# Patient Record
Sex: Male | Born: 1935
Health system: Southern US, Community
[De-identification: ages and names within clinical notes are randomized; demographics above are authoritative.]

## PROBLEM LIST (undated history)

## (undated) DIAGNOSIS — C801 Malignant (primary) neoplasm, unspecified: Secondary | ICD-10-CM

## (undated) DIAGNOSIS — R0600 Dyspnea, unspecified: Secondary | ICD-10-CM

## (undated) DIAGNOSIS — E785 Hyperlipidemia, unspecified: Secondary | ICD-10-CM

## (undated) DIAGNOSIS — I10 Essential (primary) hypertension: Secondary | ICD-10-CM

## (undated) DIAGNOSIS — I2510 Atherosclerotic heart disease of native coronary artery without angina pectoris: Secondary | ICD-10-CM

## (undated) DIAGNOSIS — E119 Type 2 diabetes mellitus without complications: Secondary | ICD-10-CM

## (undated) HISTORY — PX: PROSTATE ABLATION: SHX6042

## (undated) HISTORY — DX: Malignant (primary) neoplasm, unspecified: C80.1

## (undated) HISTORY — PX: HERNIA REPAIR: SHX51

## (undated) HISTORY — DX: Type 2 diabetes mellitus without complications: E11.9

## (undated) HISTORY — PX: ANTERIOR FUSION CERVICAL SPINE: SUR626

## (undated) HISTORY — DX: Hyperlipidemia, unspecified: E78.5

## (undated) HISTORY — PX: TONSILLECTOMY: SUR1361

## (undated) SURGERY — Surgical Case
Anesthesia: *Unknown

---

## 1998-11-20 ENCOUNTER — Ambulatory Visit (HOSPITAL_COMMUNITY): Admission: RE | Admit: 1998-11-20 | Discharge: 1998-11-20 | Payer: Self-pay | Admitting: *Deleted

## 1999-01-22 ENCOUNTER — Ambulatory Visit (HOSPITAL_COMMUNITY): Admission: RE | Admit: 1999-01-22 | Discharge: 1999-01-22 | Payer: Self-pay | Admitting: *Deleted

## 1999-01-22 ENCOUNTER — Encounter (INDEPENDENT_AMBULATORY_CARE_PROVIDER_SITE_OTHER): Payer: Self-pay | Admitting: Specialist

## 1999-06-04 ENCOUNTER — Encounter: Payer: Self-pay | Admitting: Neurosurgery

## 1999-06-04 ENCOUNTER — Inpatient Hospital Stay (HOSPITAL_COMMUNITY): Admission: EM | Admit: 1999-06-04 | Discharge: 1999-06-06 | Payer: Self-pay | Admitting: Emergency Medicine

## 1999-06-04 ENCOUNTER — Encounter: Payer: Self-pay | Admitting: Emergency Medicine

## 1999-06-25 ENCOUNTER — Encounter: Admission: RE | Admit: 1999-06-25 | Discharge: 1999-06-25 | Payer: Self-pay | Admitting: Neurosurgery

## 1999-06-25 ENCOUNTER — Encounter: Payer: Self-pay | Admitting: Neurosurgery

## 1999-09-03 ENCOUNTER — Encounter: Payer: Self-pay | Admitting: *Deleted

## 1999-09-03 ENCOUNTER — Emergency Department (HOSPITAL_COMMUNITY): Admission: EM | Admit: 1999-09-03 | Discharge: 1999-09-03 | Payer: Self-pay | Admitting: *Deleted

## 1999-09-11 ENCOUNTER — Emergency Department (HOSPITAL_COMMUNITY): Admission: EM | Admit: 1999-09-11 | Discharge: 1999-09-11 | Payer: Self-pay | Admitting: Emergency Medicine

## 2001-12-13 ENCOUNTER — Ambulatory Visit (HOSPITAL_BASED_OUTPATIENT_CLINIC_OR_DEPARTMENT_OTHER): Admission: RE | Admit: 2001-12-13 | Discharge: 2001-12-14 | Payer: Self-pay | Admitting: Orthopaedic Surgery

## 2004-04-05 ENCOUNTER — Encounter (INDEPENDENT_AMBULATORY_CARE_PROVIDER_SITE_OTHER): Payer: Self-pay | Admitting: *Deleted

## 2004-04-05 ENCOUNTER — Inpatient Hospital Stay (HOSPITAL_COMMUNITY): Admission: RE | Admit: 2004-04-05 | Discharge: 2004-04-08 | Payer: Self-pay | Admitting: Urology

## 2006-12-27 ENCOUNTER — Ambulatory Visit (HOSPITAL_COMMUNITY): Admission: RE | Admit: 2006-12-27 | Discharge: 2006-12-27 | Payer: Self-pay | Admitting: *Deleted

## 2007-06-13 ENCOUNTER — Encounter (INDEPENDENT_AMBULATORY_CARE_PROVIDER_SITE_OTHER): Payer: Self-pay | Admitting: Urology

## 2007-06-13 ENCOUNTER — Inpatient Hospital Stay (HOSPITAL_COMMUNITY): Admission: RE | Admit: 2007-06-13 | Discharge: 2007-06-15 | Payer: Self-pay | Admitting: Urology

## 2007-06-13 HISTORY — PX: ROBOT ASSISTED LAPAROSCOPIC RADICAL PROSTATECTOMY: SHX5141

## 2008-03-18 ENCOUNTER — Encounter: Admission: RE | Admit: 2008-03-18 | Discharge: 2008-03-18 | Payer: Self-pay | Admitting: Neurosurgery

## 2010-11-23 NOTE — Op Note (Signed)
NAME:  Mark Elliott, Mark Elliott NO.:  [id number]   MEDICAL RECORD NO.:  [id number]          PATIENT TYPE:  AMB   LOCATION:  ENDO                         FACILITY:  [facility]   PHYSICIAN:  Georgiana Spinner, M.D.    DATE OF BIRTH:  05/25/1937   DATE OF PROCEDURE:  12/27/2006  DATE OF DISCHARGE:                               OPERATIVE REPORT   PROCEDURE:  Colonoscopy.   INDICATIONS:  Colon cancer screening.   ANESTHESIA:  Fentanyl 75 mcg, Versed 8 mg.   DESCRIPTION OF PROCEDURE:  With the patient mildly sedated in the left  lateral decubitus position, a rectal exam was attempted.  Subsequently  the Pentax videoscopic colonoscope was inserted into the rectum; and  passed under direct vision to the cecum identified by ileocecal valve  and appendiceal orifice both of which were photographed.  From this  point the colonoscope was slowly withdrawn taking circumferential views  of the colonic mucosa, stopping in the rectum which appeared normal on  direct; and showed hemorrhoids on retroflex view.  The endoscope was  straightened and withdrawn.  The patient's vital signs and pulse  oximeter remained stable.  The patient tolerated the procedure well  without apparent complications.   FINDINGS:  Internal hemorrhoids, small, otherwise an unremarkable exam.   PLAN:  Repeat examination in 5 years.           ______________________________  Georgiana Spinner, M.D.     GMO/MEDQ  D:  12/27/2006  T:  12/27/2006  Job:  [id number]

## 2010-11-23 NOTE — Op Note (Signed)
NAME:  Mark Elliott, Mark Elliott NO.:  [id number]   MEDICAL RECORD NO.:  [id number]          PATIENT TYPE:  INP   LOCATION:  1401                         FACILITY:  [facility]   PHYSICIAN:  Valetta Fuller, M.D.  DATE OF BIRTH:  05/25/1937   DATE OF PROCEDURE:  06/13/2007  DATE OF DISCHARGE:  06/15/2007                               OPERATIVE REPORT   PREOPERATIVE DIAGNOSIS:  Clinical stage T1C adenocarcinoma of the  prostate.   POSTOPERATIVE DIAGNOSIS:  Clinical stage T1C adenocarcinoma of the  prostate.   PROCEDURE PERFORMED:  Robotic-assisted laparoscopic radical retropubic  prostatectomy.   SURGEON:  Valetta Fuller, M.D.   ASSISTANT:  Lucrezia Starch. Earlene Plater, M.D.   ANESTHESIA:  General endotracheal   INDICATIONS:  Mark Elliott is a 75 year old male.  He is a patient of Dr.  Marcine Matar.  He had had a TURP for benign prostatic hyperplasia  several years ago and that pathology was benign.  He has been very  pleased with his urination.  Recently his PSA was noted be elevated at  approximately the level of 7.  He had an ultrasound and biopsy, which  revealed adenocarcinoma of the prostate.  His biopsy was positive in two  areas.  He had a very low-volume Gleason 3+3=6 tumor at the left mid  area and a low-volume Gleason 3+4=7 tumor at the left apex.  The patient  underwent extensive consultation with regard to treatment options with  Dr. Retta Diones as well as myself.  He elected to proceed with a surgical  approach and chose a robotic laparoscopic approach to managing his  situation.  The patient appeared to understand the advantages and  disadvantages of a surgical approach versus radiation versus observation  versus other potential therapies.  He appeared to understand the  potential complications as well as the risks and benefits of this type  of procedure.  We discussed at length issues with regard to voiding  dysfunction, incontinence and sexual dysfunction and  treatments  available for those problems.  He presents now for the surgery.   TECHNIQUE AND FINDINGS:  The patient did have placement of compression  boots.  He also received perioperative Unasyn.  He was brought to the  operating room and had successful induction of general endotracheal  anesthesia.  He was placed a moderate lithotomy position.  He was  secured to the table and all extremities were carefully padded.  He was  then placed in steep Trendelenburg position and prepped and draped in  the usual manner.  A Foley catheter was inserted sterilely on the  operative field.   Initial incision site was chosen 18 cm above the pubic symphysis just to  the left of the umbilicus.  A standard open Hasson technique was used to  place the initial 12-mm trocar.  This was done without difficulty.  Palpation of the inner aspect of the abdomen revealed no obvious  adhesions.  Insufflation of the abdomen occurred without complication or  problem.  The camera was then inserted and all additional trocars were  placed with direct visual guidance.  This included 12-mm  and 5-mm assist  ports and three 8-mm robotic ports.  Once all ports were placed, the  surgical cart was docked.  The bladder was filled to help identify it  and then this was taken down posteriorly by entering the space of  Retzius.  That space was completely developed.  Endopelvic fascia was  defatted and some fat was taken off the bladder neck to help identify  that area.  The prostate itself was noted to be small and previous  ultrasound had revealed it to be about 20 g.  The endopelvic fascia was  then incised from apex to base and puboprostatic ligaments taken down  sharply.  Levator musculature was swept off the apex of the prostate and  the dorsal vein complex was then isolated and stapled with an ETS  stapling device.  Hemostasis was good.  Attention was then turned to the  anterior bladder neck.  This was then incised and the  Foley catheter was  identified in the midline.  There was no evidence of a middle lobe.  Indigo carmine was given to identify the ureteral orifices, which were  well away from the area of resection.  The bladder neck was reasonably  open from its previous transurethral resection.  The posterior bladder  neck was then transected.  There was quite a bit of scarring in this  area but the vas deferens and seminal vesicles were each identified and  individually dissected.  Once these structures were retracted  anteriorly, we were able to identify the posterior plane between the  rectum and prostate, and that plane was easily established.   Attention was then turned towards bilateral nerve spare.  Superficial  endopelvic fascia overlying the prostate was incised and the  neurovascular bundles swept off the prostate laterally.  Once this plane  had been properly established we were able to retract the prostate  anteriorly and isolate the pedicles, which were taken with hemoclips.  The dorsal vein complexes were completely swept off the apex of the  prostate and the urethra was then divided anteriorly.  The Foley  catheter was retracted and the posterior urethra was transected.  A  small amount of rectourethralis tissue was then incised sharply and the  prostate was removed from the pelvis.  Bladder irrigation was then  performed and there was no significant bleeding or other evidence of any  rectal injury or other problems.  The bladder neck was carefully  inspected.  I did not feel required reconstruction.  Again the ureteral  orifices were identified.  The posterior fascia was reapproximated with  interrupted Vicryl suture.  We then reapproximated the 6 o'clock  urethral stump with the 6 o'clock bladder neck to take tension off these  structures and again to reapproximate things posteriorly.  Once that was  completed, a complete 360-degree running anastomosis was done with a  double-armed 3-0  Monocryl suture.  A new Coude catheter was placed  without difficulty and the bladder was irrigated.  No obvious leakage  was noted.  A pelvic drain was placed through one of the robotic ports  and placed in the retropubic space and then anchored to the skin.  The  12-mm trocar site was closed with a 0 Vicryl suture with the aid of a  suture passer.  All other trocars were removed after removal of the  specimen and placement of the specimen in the Endopouch.  The midline  open Hasson incision had to be extended slightly for specimen  removal.  The camera port incision was closed with a running 0 Vicryl suture.  All  other ports were infiltrated and closed with clips.  The bladder  irrigated clear.  Estimated blood loss was 200 mL.  The patient remained  stable, had no obvious problems or complications, and appeared to  tolerate the procedure well.  He is brought to the recovery room in  stable condition.           ______________________________  Valetta Fuller, M.D.  Electronically Signed     DSG/MEDQ  D:  06/15/2007  T:  06/15/2007  Job:  [id number]

## 2010-11-26 NOTE — Discharge Summary (Signed)
[place]. [facility]  Patient:    Mark Elliott                     MRN: [id number] Adm. Date:  [id number] Disc. Date: 06/06/99 Attending:  Emeterio Reeve                           Discharge Summary  REASON FOR ADMISSION:  Mark Elliott is a 75 year old man with severe neck and left arm pain with large cervical disk herniations at C3-4, C4-5, and C5-6 levels with significant myelopathy.  HOSPITAL COURSE:  He was admitted to the hospital on November 24, taken to surgery and underwent anterior cervical diskectomy and fusion at C3-4, C4-5, and C5-6 levels.  He had significant improvement in his clonus and hyperreflexia, was doing well on the day after, November 25, and was doing well on the morning of November 26, was discharged home in satisfactory and stable condition having tolerated his operation and hospitalization well.  DISCHARGE MEDICATIONS:  Percocet 1-2 q.4-6h. p.r.n. pain.  DISCHARGE INSTRUCTIONS:  Wear a collar at all times.  No lifting, bending, twisting or driving.  Okay to shower, not to soak his incision.  FOLLOWUP:  In two weeks with Dr. Venetia Maxon. DD:  06/06/99 TD:  06/06/99 Job: [id number]

## 2010-11-26 NOTE — Discharge Summary (Signed)
NAME:  Mark Elliott, Mark Elliott NO.:  [id number]   MEDICAL RECORD NO.:  [id number]          PATIENT TYPE:  INP   LOCATION:  0344                         FACILITY:  [facility]   PHYSICIAN:  Bertram Millard. Dahlstedt, M.D.DATE OF BIRTH:  05/25/1937   DATE OF ADMISSION:  04/05/2004  DATE OF DISCHARGE:  04/08/2004                                 DISCHARGE SUMMARY   PRIMARY DIAGNOSIS:  Benign prostatic hypertrophy.   SECONDARY DIAGNOSIS:  Phimosis, balanitis.   PROCEDURE:  On April 05, 2004, transurethral resection of the prostate,  circumcision.   BRIEF HISTORY:  A 75 year old male who has been followed by me for several  years.  He has a large prostate and has significant symptoms from this.  Additionally, the patient has recurrent fungal infections and irritation of  his foreskin.   After conservative followup, the patient desires management of these  surgically.  He was instructed on the procedures as well as the risks and  complications of TURP and circumcision.  He desires to proceed.   HOSPITAL COURSE:  [name] was admitted directly to the operating room.  After preoperative antibiotic prophylaxis, he underwent TURP and  circumcision.  He actually did quite well during the surgery.  His urine did  stay bloody for more than two days afterwards, and it took two days of full  bladder irrigation to get his urine clear.  Additionally, his catheter was  hand-irrigated.   His catheter was removed on the 29th, three days postoperatively.  He was  discharged from the hospital at that time.  He was voiding well.  His  circumcision incision was healing well.   He was discharged on all of his home medications plus Bactrim DS 1 p.o.  daily x10 days.  He was told that he could resume his aspirin in a week.  He  was given activity instructions.  He will follow up with me in approximately  three weeks.   Pathologic analysis of the patient's prostatic chips reveal no evidence of  cancer.     SMD/MEDQ  D:  04/16/2004  T:  04/17/2004  Job:  [id number]   cc:   Brooke Bonito, M.D.  [address]  [place]  [place] [address]  Fax: [phone]

## 2010-11-26 NOTE — Op Note (Signed)
[place]. [facility]  Patient:    Mark Elliott, Mark Elliott Visit Number: [id number] MRN: [id number]          Service Type: DSU Location: [facility] Attending Physician:  Randolm Idol Dictated by:   Claude Manges. Cleophas Dunker, M.D. Proc. Date: 12/13/01 Admit Date:  12/13/2001 Discharge Date: 12/14/2001                             Operative Report  PREOPERATIVE DIAGNOSIS: Rotator cuff tear, right shoulder with impingement.  POSTOPERATIVE DIAGNOSIS: Rotator cuff tear, right shoulder with impingement.  PROCEDURE: Rotator cuff tear repair with arthroscopic subacromial decompression.  SURGEON:  Claude Manges. Cleophas Dunker, M.D.  ANESTHESIA:  General orotracheal anesthesia.  COMPLICATIONS: None.  HISTORY: The patient is a 75 year old diabetic male who sustained an injury to his right shoulder approximately a month ago.  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 He has had difficulty raising his arm over his head with weakness and with pain.  He has had an MRI scan that reveals a full thickness rotator cuff tear.  He is now to have an arthroscopic subacromial decompression and an open mini rotator cuff tear repair.  PROCEDURE: With the patient comfortable on the operating table and under general orotracheal anesthesia, the patient was placed in the semi-sitting position using a shoulder frame.  The right shoulder was then prepped and the base of the neck circumferentially below the elbow with DuraPrep.  Sterile draping was performed.  A marking pen was used to outline the acromion, the acromioclavicular joint and the coracoid.  At a point a finger breadth inferior and medial to the posterior angle of the acromion, a small stab wound was made following which 0.25% Marcaine with epinephrine was injected.  A small stab wound was made and diagnostic arthroscopy was performed.   There was absence of the biceps tendon; it had absolutely ruptured.  There was a small stump.  There was some fraying of the anterior glenoid labrum and minimal chondromalacia changes of the glenoid and the humeral head. A second portal was established anteriorly so that I could debride the labrum and the biceps tendon.  I could also visualize the rotator cuff tear of the infra and supraspinatus tendon.  The arthroscope was then placed in the subacromial space posteriorly. A third space was established in the mid lateral plane and arthroscopic subacromial decompression was performed.  There was a moderate amount of bursal tissue which was resected with the shaver and the Arthricare wand. The 6 mm bur was used to perform an anterior acromioplasty.  The patient did not have symptoms of the acromioclavicular joint.  A mini open incision was then made at the junction of the lateral and anterior acromion.  About a two inch incision was made and via sharp dissection was carried down to the subcutaneous tissue.  The deltoid fascia was identified and incised longitudinally. A small self-retaining retractor was inserted. The rotator cuff tear was identified; it was longitudinal in nature extending about two inches from the attachment of the rotator cuff posteriorly. I trimmed the edges and was able to repair it from back to front such that I had a complete repair and did not need any type of an anchor for repair.  I probed it carefully and it felt that I had an excellent repair  of good rotator cuff tissue.  I also palpated the acromioplasty and felt an excellent decompression.  There was no evidence for any further impingement.  The wound was then copiously irrigated with saline solution.  The deltoid fascia was closed anatomically with a running 0 Vicryl, the subcutaneous closed with the same material and the skin closed with skin clips.  A sterile bulky dressing was applied followed by a  sling.  The patient did have an interscalene block preoperatively for pain control.  PLAN:  Recovery care overnight, Percocet at home, sling and to return to the office in one week.  Dictated by:   Claude Manges. Cleophas Dunker, M.D. Attending Physician:  Randolm Idol DD:  12/13/01 TD:  12/16/01 Job: [phone]

## 2010-11-26 NOTE — Discharge Summary (Signed)
NAME:  DAVIER, TRAMELL NO.:  [id number]   MEDICAL RECORD NO.:  [id number]          PATIENT TYPE:  INP   LOCATION:  1401                         FACILITY:  [facility]   PHYSICIAN:  Valetta Fuller, M.D.  DATE OF BIRTH:  05/25/1937   DATE OF ADMISSION:  06/13/2007  DATE OF DISCHARGE:  06/15/2007                               DISCHARGE SUMMARY   DISCHARGE DIAGNOSIS:  Adenocarcinoma of the prostate (pathologic stage  T2C tumor.)   OPERATION PERFORMED:  Robotic assisted laparoscopic radical retropubic  prostatectomy on June 13, 2007.   HOSPITAL COURSE:  Mr. Helzer is a 75 year old male.  He underwent a TURP  for benign disease several years earlier.  PSA increased to 7.  He had  an ultrasound biopsy which revealed adenocarcinoma of the prostate.  He  had Gleason 3+3=6 tumor in the left mid area and Gleason 3+4=7 tumor at  the left apex.  The patient underwent extensive consultation and elected  to have a robotic assisted laparoscopic radical retropubic  prostatectomy.  The patient's surgery was performed on June 13, 2007.  No obvious complications or problems occurred and surgery was  uneventful.  The patient was admitted for routine postoperative care.  On postoperative day 1, he remained stable with good urinary output.  Postoperative hemoglobin was 12.1.  JP drainage was relatively minimal.  The patient was kept an additional day.  On postoperative day 2, he  remained afebrile.  He was feeling much better with better pain control.  Jackson-Pratt drainage was minimal and was removed.  He continued to  have excellent urinary output.  The patient's postoperative course again  was otherwise uneventful.  The patient's final pathology revealed  bilateral Gleason 3+4=7 tumor.  There was no evidence of capsular  extension.  Margins were negative and seminal vesicles were also  negative.   DISPOSITION:  The patient was discharged to home.  He was given oral  pain  medication prescription.  Catheter was left indwelling and he  underwent leg bag teaching.   FOLLOW UP:  He will have followup in our office established for  approximately 10 days for catheter removal, voiding trial and staple  removal.           ______________________________  Valetta Fuller, M.D.  Electronically Signed     DSG/MEDQ  D:  07/20/2007  T:  07/21/2007  Job:  [id number]

## 2010-11-26 NOTE — H&P (Signed)
[place]. [facility]  Patient:    Mark Elliott                     MRN: [id number] Adm. Date:  [id number] Attending:  Emeterio Reeve                         History and Physical  ADMISSION DIAGNOSIS:  Herniated disc at C3-4, C4-5 with cervical myelopathy.  SERVICE:  Neurosurgery.  HISTORY OF PRESENT ILLNESS:  This is a 75 year old right-handed black gentleman, who for a week has been having neck pain.  He visited with his home doctor and received some Vicodin and anti-inflammatories.  He went back on Wednesday and got a Medrol Dosepak, which helped only slightly.  He has neck and intractable left shoulder and arm pain.  He came to the emergency room this morning and had an MRI done which shows a large herniated disc at both C3-4 and C4-C5 with compression, not only of the left C5 nerve root, but also the spinal cord.  He does not notice any tripping and stumbling nor incontinence.  MEDICAL HISTORY:  Remarkable for adult-onset diabetes, for which he is on Glucophage, Actos and Glucotrol XL; he is also at the end of a Medrol Dosepak and is taking Tylox.  ALLERGIES:  He has no allergies.  PAST SURGICAL HISTORY:  His only other surgery has been a tonsillectomy.  FAMILY HISTORY:  Unknown.  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  REVIEW OF SYSTEMS:  Remarkable for neck and shoulder pain.  PHYSICAL EXAMINATION:  HEENT:  Within normal limits.  NECK:  His neck has limited range of motion in extension.  Turning towards the eft causes excruciating left arm pain.  He also gets tingling down both arms and both legs.  CHEST:  Clear.  CARDIAC:  Regular rate and rhythm.  ABDOMEN:  Nontender.  No hepatosplenomegaly.  EXTREMITIES:  Without clubbing or cyanosis.  GU:  Exam is deferred.  PERIPHERAL PULSES:  Good.  NEUROLOGIC:  He is awake, alert and oriented.  His cranial nerves are intact. Motor exam  demonstrates 5/5 strength throughout the right-sided upper and lower  extremities.  In the left upper extremity, he has weakness of the deltoid and weakness of the biceps.  He has a left C5 sensory deficit.  He has a positive Hoffmanns bilaterally.  His reflexes are 3+ in the legs with clonus in both feet and his toes are upgoing.  MRI FINDINGS:  MRI has been reviewed above that shows a disc at 3-4 and 4-5.  CLINICAL IMPRESSION:  Left C7 radiculopathy and spondylitic myelopathy.  PLAN:  The plan is for emergent cervical diskectomy and fusion at 3-4 and 4-5. I see no benefit to waiting due to the fact that he is myelopathic and has some acute bulge in the anterior epidural space, suggesting that this operation should occur soon.  The risks and benefits of this approach have been discussed with him and his wife and he wishes to proceed. DD:  06/04/99 TD:  06/04/99 Job: [id number]

## 2010-11-26 NOTE — Op Note (Signed)
NAME:  ZYHIR, CAPPELLA NO.:  [id number]   MEDICAL RECORD NO.:  [id number]          PATIENT TYPE:  INP   LOCATION:  NA                           FACILITY:  [facility]   PHYSICIAN:  Bertram Millard. Dahlstedt, M.D.DATE OF BIRTH:  05/25/1937   DATE OF PROCEDURE:  04/05/2004  DATE OF DISCHARGE:                                 OPERATIVE REPORT   PREOPERATIVE DIAGNOSIS:  1.  Benign prostatic hypertrophy with significant lower urinary tract      symptoms.  2.  History of balanitis with mild phimosis.   PRINCIPAL PROCEDURE:  1.  Transurethral resection of the prostate.  2.  Circumcision.   ANESTHESIA:  General endotracheal.   COMPLICATIONS:  None.   SURGEON:  Bertram Millard. Dahlstedt, M.D.   BRIEF HISTORY:  This nice 75 year old male comes to the operating room  for  TURP and circumcision.  This has been discussed with the patient in depth in  the past.  He has significant lower urinary tract symptoms and has recurrent  problems with balanitis and mild phimosis of his foreskin.  He is aware of  the risks and complications of both TURP and circumcision and desires to  proceed.   DESCRIPTION OF PROCEDURE:  The patient was administered a general anesthetic  after preoperative IV antibiotics were administered.  He was placed in the  dorsal lithotomy position.  Genitalia and perineum were prepped and draped.  The urethral meatus was calibrated.  The 77 [place] with [employer].  A 28 French resectoscope sheath was placed.  The resectoscope was then  placed.  The bladder was inspected and found to be normal.  The prostate was  moderately obstructed with trilobar hypertrophy.  There was a moderate-sized  intravesical median lobe.  The median lobe was then taken down to the  surgical capsule from the bladder neck to the verumontanum.  The  verumontanum was left intact to use as a landmark.  The right and left  prostatic lobes were then resected from the bladder neck down to the  apex.  Resection was carried down to the surgical capsule.  Anterior tissue was  taken down and resected as well.  All bleeders were electrocoagulated.  There was a capacious prostatic fossa at this point with adequate  hemostasis.  The chips were evacuated from the bladder.  Inspection of the  bladder revealed all prostatic chips to be evacuated.  At this point, the  scope was removed.  A 22 French Foley catheter with three-way irrigation was  placed.  A 30 cc balloon was filled.  The specimens were sent for pathologic  examination.   Circumcision was then performed at this point.  Proximal and distal  incisions in the foreskin were made, and then the foreskin was excised with  electrocautery.  Small bleeders underneath the foreskin were  electrocoagulated.  Quadrant sutures were placed using 4-0 chromic with a U  stitch at the frenulum.  The same 4-0 chromic was used to run the incision  closed in a simple running fashion in between the quadrant sutures.  Hemostasis was then excellent.  A  dressing of Vaseline gauze, Kling, and  Coban was then placed without too much pressure, to avoid  ischemia of the glans.  At this point, the procedure was terminated.  The  Foley was hooked up to overhead irrigation with saline.  He tolerated the  procedure well.  Plain Marcaine 0.5% 10 cc was used as a dorsal penile  block.  At this point, the patient was awakened, extubated, and taken to the  PACU in stable condition.      SMD/MEDQ  D:  04/05/2004  T:  04/05/2004  Job:  [id number]   cc:   Brooke Bonito, M.D.  [address]  [place]  [place] [address]  Fax: [phone]

## 2010-11-26 NOTE — Op Note (Signed)
[place]  Patient:    Mark Elliott                     MRN: [id number] Proc. Date: 06/04/99 Adm. Date:  [id number] Attending:  Emeterio Reeve                           Operative Report  PREOPERATIVE DIAGNOSIS:  Cervical myelopathy with a left C5 and C6 radiculopathy with a herniated disk at C3-4, C4-5 and C5-C6.  POSTOPERATIVE DIAGNOSIS:  Cervical myelopathy with a left C5 and C6 radiculopathy with a herniated disk at C3-4, C4-5 and C5-C6.  OPERATION PERFORMED:  Anterior cervical diskectomy and fusion at C3-4, C4-5 and  C5-C6.  SURGEON:  Payton Doughty, M.D.  ANESTHESIA:  General endotracheal.  PREP:  Sterile Betadine prep and scrub with alcohol wipe.  COMPLICATIONS:  None.  DESCRIPTION OF PROCEDURE:  This 75 year old gentleman with cervical spondylosis, herniated disk and cervical myelopathy and left C5 and C6 radiculopathies.  The patient was taken to the operating room, smoothly anesthetized and intubated, and placed supine on the operating table.  During intubation, the head was held  neutral and his neck was only very slightly extended in the halter head traction. Following shave, prep and drape in the usual sterile fashion, skin was incised rom the level of the carotid tubercle parallelling the sternocleidomastoid muscle superiorly to about 1 cm in the angle of the jaw.  The platysma was identified,  elevated and divided.  Medial dissection revealed the sternocleidomastoid muscle and medial dissection dissection of that revealed the carotid artery which was retracted laterally to the left, trachea and esophagus retracted laterally to the right.  Intraoperative x-ray was obtained with marker in place to confirm correctness of the level.  A second film was obtained that showed a marker to be at 3-4.  Diskectomy was carried out at that level and two levels contiguous with it below.  The longus coli muscle was taken  down and the Shadowline retractor placed. Diskectomy was then carried out at 3-4, 4-5 and 5-6.  It was done at each level  first under gross observation and then the operating microscope was brought in nd operating through the disk space to ensure that all neural structures were decompressed.  At C3-C4 there was a central disk slightly eccentric to the right with distortion of the right side of the cord.  At C4-C5 there was an enormous central disk that extruded below the body and down over the body of C5.  This was removed without difficulty resulting in immediate decompression of the cord. There was also a bit of a left-sided component out over the left nerve root.  At C5-C6 there was a very large component extending out towards the left side out over the left C6 nerve root.  This was decompressed as well.  Having completed diskectomy and exploring both nerve roots at each level, 7 mm bone grafts were fashioned from patellar allograft and tapped into place.  A 66 mm A-line plate was then placed  with two screws at C3, one at C4, one at C5 and two in C6.  Intraoperative x-ray showed good placement of bone grafts, plate and screws.  Locking screws were placed.  The wound was irrigated and hemostasis assured.  The platysma was reapproximated with 3-0 Vicryl in interrupted fashion.  The subcutaneous tissues were reapproximated with 3-0 Vicryl in interrupted fashion.  The skin was closed with 4-0 Vicryl in a running subcuticular fashion.  Benzoin and Steri-Strips were placed and made occlusive with Telfa and Op-Site.  The patient then placed in an Aspen collar and returned to the recovery room in good condition. DD:  06/04/99 TD:  06/05/99 Job: [id number]

## 2010-11-26 NOTE — H&P (Signed)
NAME:  Mark Elliott, Mark Elliott NO.:  [id number]   MEDICAL RECORD NO.:  [id number]          PATIENT TYPE:  INP   LOCATION:  NA                           FACILITY:  [facility]   PHYSICIAN:  Bertram Millard. Dahlstedt, M.D.DATE OF BIRTH:  05/25/1937   DATE OF ADMISSION:  04/05/2004  DATE OF DISCHARGE:                                HISTORY & PHYSICAL   REASON FOR ADMISSION:  Large prostate.   BRIEF HISTORY:  This nice 75 year old gentleman has been seen by me for a  few years. He initially presented with prostatitis. He has had a minimally  elevated PSA. His PSAs actually have been on the upper limits of normal for  a man his age. He had never had a suspicious examination of his prostate.   The patient has been followed recently for significant lower urinary tract  symptoms. Specifically, he has an IPSS score of 17. He was recently  evaluated with cystoscopy and a flow rate. Peak flow was 6 cc. He had  obstruction evidence cystoscopically.    The patient has desired management of his lower urinary tract symptoms. We  talked about minimally invasive thermotherapy versus a TURP. The patient is  also having some significant problems with balanitis and mild phimosis of  his foreskin. I suggested we take care of both of these under an anesthetic  with TURP and circumcision. He is aware of the risks and complications  associated with both of these procedures. He understands and desires to  proceed.   PAST MEDICAL HISTORY:  1.  Significant for diabetes.  2.  He has erectile dysfunction.  3.  He has had a history of elevated cholesterol. Dr. Juleen China follows him from      a medical standpoint.   PAST SURGICAL HISTORY:  Significant for cervical surgery in 2002 and rotator  cuff surgery in 2003.   MEDICATIONS:  Actos and Glucovance as well as Pravachol.   ALLERGIES:  There are no known drug allergies.   SOCIAL HISTORY:  The patient is married. He is retired. He has 2 children.   FAMILY HISTORY:  Significant for diabetes.   REVIEW OF SYSTEMS:  Noncontributory.   PHYSICAL EXAMINATION:  GENERAL:  Healthy appearing pleasant middle-aged  male.  VITAL SIGNS:  Blood pressure 122/72, pulse 60, temperature 97.8, respiratory  rate 12.  HEENT:  Normal.  NECK:  Supple without thyromegaly or adenopathy.  CHEST:  Clear bilaterally.  HEART:  Regular rate and rhythm without murmurs, rubs, or gallops.  ABDOMEN:  Protuberant, soft, nondistended, nontender. No masses, no megaly.  GENITOURINARY:  Bladder not palpable. Phallus uncircumcised. He has mild  phimosis with some skin changes on the foreskin consistent with chronic  infections. Glands and meatus are normal. Scrotal skin unremarkable.  Testicles descended bilaterally. Cords and epididymal strictures normal.  Normal anal sphincter tone. Gland 30 to 40 g, symmetrical, nonnodular,  nontender. No rectal masses. Seminal vesicles were nonpalpable.  PERIPHERAL VASCULAR:  Normal.   ADMISSION DATA:  EKG:  Normal sinus rhythm with sinus rhythm with short PR.  He has a right bundle branch block. PSA was 4.1.  BMET was normal for glucose  of 220. CBC was normal except for a white count of 3,800.   IMPRESSION:  1.  Benign prostatic hypertrophy with significant lower urinary tract      symptomatology.  2.  Phimosis with history of balanitis.  3.  Diabetes.   PLAN:  Admit for cystoscopy, transurethral resection of the prostate, and  circumcision.      SMD/MEDQ  D:  04/05/2004  T:  04/05/2004  Job:  [id number]   cc:   Brooke Bonito, M.D.  [address]  [place]  [place] [address]  Fax: [phone]

## 2011-04-18 LAB — BASIC METABOLIC PANEL
BUN: 11
BUN: 15
CO2: 25
CO2: 28
Calcium: 8.7
Calcium: 9.2
Chloride: 101
Chloride: 105
Creatinine, Ser: 1.1
Creatinine, Ser: 1.13
GFR calc Af Amer: >60
GFR calc Af Amer: >60
GFR calc non Af Amer: >60
GFR calc non Af Amer: >60
Glucose, Bld: 141 — ABNORMAL HIGH
Glucose, Bld: 230 — ABNORMAL HIGH
Potassium: 4
Potassium: 4.2
Sodium: 135
Sodium: 135

## 2011-04-18 LAB — HEMOGLOBIN AND HEMATOCRIT, BLOOD
HCT: 38 — ABNORMAL LOW
Hemoglobin: 13.2

## 2011-04-18 LAB — TYPE AND SCREEN
ABO/RH(D): O POS
Antibody Screen: NEGATIVE

## 2011-04-18 LAB — CBC
HCT: 34.9 — ABNORMAL LOW
Hemoglobin: 12.1 — ABNORMAL LOW
MCHC: 34.6
MCV: 85.8
Platelets: 219
RBC: 4.07 — ABNORMAL LOW
RDW: 14.1
WBC: 6.2

## 2011-04-18 LAB — DIFFERENTIAL
Basophils Absolute: 0
Basophils Relative: 1
Eosinophils Absolute: 0 — ABNORMAL LOW
Eosinophils Relative: 0
Lymphocytes Relative: 16
Lymphs Abs: 1
Monocytes Absolute: 0.5
Monocytes Relative: 8
Neutro Abs: 4.7
Neutrophils Relative %: 76

## 2011-04-18 LAB — URINALYSIS, ROUTINE W REFLEX MICROSCOPIC
Bilirubin Urine: NEGATIVE
Glucose, UA: NEGATIVE
Hgb urine dipstick: NEGATIVE
Ketones, ur: NEGATIVE
Nitrite: NEGATIVE
Protein, ur: NEGATIVE
Specific Gravity, Urine: 1.022
Urobilinogen, UA: 0.2
pH: 5.5

## 2011-04-18 LAB — ABO/RH: ABO/RH(D): O POS

## 2012-04-26 ENCOUNTER — Ambulatory Visit: Payer: Self-pay | Admitting: Family Medicine

## 2012-04-26 VITALS — BP 150/80 | HR 83 | Temp 98.1°F | Resp 16 | Ht 67.0 in | Wt 220.0 lb

## 2012-04-26 DIAGNOSIS — Z0289 Encounter for other administrative examinations: Secondary | ICD-10-CM

## 2012-04-26 DIAGNOSIS — Z0000 Encounter for general adult medical examination without abnormal findings: Secondary | ICD-10-CM

## 2012-04-26 NOTE — Progress Notes (Signed)
See DOT form  bp recheck 128/62

## 2013-02-13 ENCOUNTER — Encounter (INDEPENDENT_AMBULATORY_CARE_PROVIDER_SITE_OTHER): Payer: Self-pay | Admitting: General Surgery

## 2013-02-13 ENCOUNTER — Ambulatory Visit (INDEPENDENT_AMBULATORY_CARE_PROVIDER_SITE_OTHER): Payer: Medicare Other | Admitting: General Surgery

## 2013-02-13 VITALS — BP 126/68 | HR 64 | Resp 14 | Ht 66.0 in | Wt 227.4 lb

## 2013-02-13 DIAGNOSIS — N509 Disorder of male genital organs, unspecified: Secondary | ICD-10-CM

## 2013-02-13 NOTE — Progress Notes (Signed)
Chief complaint: Left groin stinging, question hernia, skin lesion  History: Patient is a 77 year old male referred by Dr. Juleen China for some left groin discomfort and possible hernia. He states that for about a month he has had some stinging in his left groin but this seems to be off and while toweling off after the shower. It is very brief period he thinks there may be a bulge that comes and goes. He has no previous history of hernia repair. He does have a history of robotic prostatectomy and also has had a penile prosthesis pump as well. He has no associated GI symptoms.  Past Medical History  Diagnosis Date  . Cancer   . Diabetes mellitus without complication   . Hyperlipidemia    Past Surgical History  Procedure Laterality Date  . Anterior fusion cervical spine     Current Outpatient Prescriptions  Medication Sig Dispense Refill  . aspirin 81 MG tablet Take 81 mg by mouth daily.      . insulin lispro (HUMALOG) 100 UNIT/ML injection Inject into the skin 3 (three) times daily before meals.      . Liraglutide (VICTOZA [place]) Inject into the skin.      . metFORMIN (GLUCOPHAGE) 500 MG tablet Take 500 mg by mouth 2 (two) times daily with a meal.      . pravastatin (PRAVACHOL) 10 MG tablet Take 10 mg by mouth daily.       No current facility-administered medications for this visit.   No Known Allergies History  Substance Use Topics  . Smoking status: Former Smoker    Quit date: 02/13/1974  . Smokeless tobacco: Never Used  . Alcohol Use: No    Exam: BP 126/68  Pulse 64  Resp 14  Ht 5\' 6"  (1.676 m)  Wt 227 lb 6.4 oz (103.148 kg)  BMI 36.72 kg/m2 General: Well-developed Afro-American male no distress Skin: No rash or infection Lungs: Clear equal breath sounds Cardiac: Regular rate and rhythm. No edema. Abdomen: Soft and nontender. Careful palpation the left inguinal area I cannot feel any definite hernia with coughing or straining. No hernia palpable on the right. He does however have  an approximately 1 cm raised pigmented skin lesion on the lower left groin or upper scrotum which he indicates is what has been stinging.  Assessment and plan: Left groin discomfort. On questioning  and exam the skin lesion on his upper left scrotum appears to be what is bothering him. I cannot demonstrate a hernia on physical exam today. After discussion today he would very much like to have this removed as he feels it is the source of his symptoms.  After explaining the procedure, under local anesthesia with sterile technique I did an elliptical excision of the skin lesion into the subcutaneous tissue and closed this with interrupted nylon. This was sent for permanent pathology. Wound care was explained.  He will return next week for suture removal. I told them that if this does not relieve his symptoms or he notices a definite bulge or continued pain to return for a recheck.

## 2013-02-13 NOTE — Addendum Note (Signed)
Addended by: Maryan Puls on: 02/13/2013 02:14 PM   Modules accepted: Orders

## 2013-02-13 NOTE — Patient Instructions (Signed)
Keep incision dry tonight then may shower daily and leave uncovered orcover with a Band-Aid if more comfortable

## 2013-02-15 ENCOUNTER — Telehealth (INDEPENDENT_AMBULATORY_CARE_PROVIDER_SITE_OTHER): Payer: Self-pay

## 2013-02-15 NOTE — Telephone Encounter (Signed)
Message copied by Maryan Puls on Fri Feb 15, 2013  9:45 AM ------      Message from: Glenna Fellows T      Created: Fri Feb 15, 2013  9:14 AM       Please call patient "Skin lesion is benign" ------

## 2013-02-15 NOTE — Telephone Encounter (Signed)
Called and spoke to patient, pathology results given (skin lesion is Benign)  Patient reports he's doing well and very pleased with his pathology results.

## 2013-02-22 ENCOUNTER — Encounter (INDEPENDENT_AMBULATORY_CARE_PROVIDER_SITE_OTHER): Payer: Medicare Other

## 2013-02-25 ENCOUNTER — Ambulatory Visit (INDEPENDENT_AMBULATORY_CARE_PROVIDER_SITE_OTHER): Payer: Medicare Other

## 2013-02-25 DIAGNOSIS — Z4802 Encounter for removal of sutures: Secondary | ICD-10-CM

## 2013-02-25 NOTE — Progress Notes (Signed)
Pt comes in today for suture removal of 3 sutures post skin lesion removal of the scrotal area.  The incision looked good, completely closed.  I prepped the incision with Chloraprep and removed all three sutures.  There was no bleeding.  I applied a basic band-aid.

## 2013-04-15 ENCOUNTER — Telehealth (INDEPENDENT_AMBULATORY_CARE_PROVIDER_SITE_OTHER): Payer: Self-pay

## 2013-04-15 NOTE — Telephone Encounter (Signed)
Per physician request, copy of pathology report from 02/13/13 faxed to Dr. Juleen China.  Fax confirmation rec'd.

## 2013-07-01 ENCOUNTER — Other Ambulatory Visit: Payer: Self-pay | Admitting: Gastroenterology

## 2013-07-01 DIAGNOSIS — R1033 Periumbilical pain: Secondary | ICD-10-CM

## 2013-07-01 DIAGNOSIS — R634 Abnormal weight loss: Secondary | ICD-10-CM

## 2013-07-09 ENCOUNTER — Ambulatory Visit
Admission: RE | Admit: 2013-07-09 | Discharge: 2013-07-09 | Disposition: A | Discharge status: Home / Self Care | Payer: Medicare Other | Source: Ambulatory Visit | Attending: Gastroenterology | Admitting: Gastroenterology

## 2013-07-09 DIAGNOSIS — R634 Abnormal weight loss: Secondary | ICD-10-CM

## 2013-07-09 DIAGNOSIS — R1033 Periumbilical pain: Secondary | ICD-10-CM

## 2013-07-09 MED ORDER — IOHEXOL 300 MG/ML  SOLN
125.0000 mL | Freq: Once | INTRAMUSCULAR | Status: AC | PRN
  Administered 2013-07-09: 125 mL via INTRAVENOUS

## 2013-08-09 ENCOUNTER — Ambulatory Visit (INDEPENDENT_AMBULATORY_CARE_PROVIDER_SITE_OTHER): Payer: Medicare Other | Admitting: General Surgery

## 2013-08-09 ENCOUNTER — Encounter (INDEPENDENT_AMBULATORY_CARE_PROVIDER_SITE_OTHER): Payer: Self-pay | Admitting: General Surgery

## 2013-08-09 ENCOUNTER — Encounter (INDEPENDENT_AMBULATORY_CARE_PROVIDER_SITE_OTHER): Payer: Self-pay

## 2013-08-09 VITALS — BP 126/70 | HR 74 | Temp 97.6°F | Resp 17 | Ht 66.0 in | Wt 196.4 lb

## 2013-08-09 DIAGNOSIS — K4090 Unilateral inguinal hernia, without obstruction or gangrene, not specified as recurrent: Secondary | ICD-10-CM

## 2013-08-09 NOTE — Progress Notes (Signed)
Chief complaint: Evaluation for inguinal hernias  History: Patient returns to the office for a recheck. I had seen him not long ago for a possible left inguinal hernia. He did have a skin lesion that was irrigated on his upper scrotum that we removed and he states that he has not had any more discomfort in either groin since that time. He however has had some ongoing issues with upper abdominal discomfort and nausea area he saw Dr. Benson Norway and with a family history of prostate cancer a CT scan of the abdomen was obtained. This showed no issues in his upper abdomen but noted were a "tiny fat-containing left inguinal hernia and a small fluid containing right inguinal hernia". Again the patient denies any groin discomfort or swelling or bulge.  Past Medical History  Diagnosis Date  . Cancer   . Diabetes mellitus without complication   . Hyperlipidemia    Past Surgical History  Procedure Laterality Date  . Anterior fusion cervical spine    . Hernia repair     Current Outpatient Prescriptions  Medication Sig Dispense Refill  . aspirin 81 MG tablet Take 81 mg by mouth daily.      . clarithromycin (BIAXIN XL) 500 MG 24 hr tablet       . insulin lispro (HUMALOG) 100 UNIT/ML injection Inject into the skin 3 (three) times daily before meals.      . Liraglutide (VICTOZA [place]) Inject into the skin.      . metFORMIN (GLUCOPHAGE) 500 MG tablet Take 500 mg by mouth 2 (two) times daily with a meal.      . ondansetron (ZOFRAN-ODT) 4 MG disintegrating tablet       . pravastatin (PRAVACHOL) 10 MG tablet Take 10 mg by mouth daily.      . traMADol (ULTRAM) 50 MG tablet        No current facility-administered medications for this visit.   No Known Allergies   Exam: BP 126/70  Pulse 74  Temp(Src) 97.6 F (36.4 C) (Temporal)  Resp 17  Ht 5\' 6"  (1.676 m)  Wt 196 lb 6.4 oz (89.086 kg)  BMI 31.71 kg/m2 Gen.: A thin African American male in no distress Abdomen generally soft and nontender. Nondistended. With  careful palpation of both groins with the patient standing and coughing and doing Valsalva maneuver I cannot feel any evidence of any significant hernia in either groin. He is nontender in this area.  Assessment and plan: Very small bilateral inguinal hernias noted on CT scan which by careful history are entirely asymptomatic and his CT scan was done for symptoms that are clearly unrelated to this. I do not feel hernias on exam. I do not feel he needs any repair. We discussed the finding by CT and I discussed with him that we will occasionally see tiny hernias noted on CT scan are clinically insignificant. I encouraged him to come back for evaluation should he notice any groin pain or bulge.

## 2013-08-14 ENCOUNTER — Other Ambulatory Visit: Payer: Self-pay | Admitting: Gastroenterology

## 2013-08-14 DIAGNOSIS — R112 Nausea with vomiting, unspecified: Secondary | ICD-10-CM

## 2013-08-22 ENCOUNTER — Ambulatory Visit (HOSPITAL_COMMUNITY)
Admission: RE | Admit: 2013-08-22 | Discharge: 2013-08-22 | Disposition: A | Discharge status: Home / Self Care | Payer: Medicare Other | Source: Ambulatory Visit | Attending: Gastroenterology | Admitting: Gastroenterology

## 2013-08-22 DIAGNOSIS — R112 Nausea with vomiting, unspecified: Secondary | ICD-10-CM | POA: Insufficient documentation

## 2013-08-22 MED ORDER — TECHNETIUM TC 99M MEBROFENIN IV KIT
5.0000 | PACK | Freq: Once | INTRAVENOUS | Status: AC | PRN
  Administered 2013-08-22: 5 via INTRAVENOUS

## 2013-08-30 ENCOUNTER — Encounter (HOSPITAL_COMMUNITY)
Admission: RE | Admit: 2013-08-30 | Discharge: 2013-08-30 | Disposition: A | Discharge status: Home / Self Care | Payer: Medicare Other | Source: Ambulatory Visit | Attending: Gastroenterology | Admitting: Gastroenterology

## 2013-08-30 DIAGNOSIS — R112 Nausea with vomiting, unspecified: Secondary | ICD-10-CM | POA: Insufficient documentation

## 2013-08-30 MED ORDER — TECHNETIUM TC 99M SULFUR COLLOID
2.0000 | Freq: Once | INTRAVENOUS | Status: AC | PRN
  Administered 2013-08-30: 2 via INTRAVENOUS

## 2013-12-29 ENCOUNTER — Encounter (HOSPITAL_COMMUNITY): Payer: Self-pay | Admitting: Emergency Medicine

## 2013-12-29 ENCOUNTER — Emergency Department (HOSPITAL_COMMUNITY)
Admission: EM | Admit: 2013-12-29 | Discharge: 2013-12-29 | Disposition: A | Discharge status: Home / Self Care | Payer: Medicare Other | Attending: Emergency Medicine | Admitting: Emergency Medicine

## 2013-12-29 DIAGNOSIS — Z981 Arthrodesis status: Secondary | ICD-10-CM | POA: Insufficient documentation

## 2013-12-29 DIAGNOSIS — M545 Low back pain, unspecified: Secondary | ICD-10-CM | POA: Insufficient documentation

## 2013-12-29 DIAGNOSIS — M5380 Other specified dorsopathies, site unspecified: Secondary | ICD-10-CM | POA: Diagnosis not present

## 2013-12-29 DIAGNOSIS — M546 Pain in thoracic spine: Secondary | ICD-10-CM | POA: Diagnosis present

## 2013-12-29 DIAGNOSIS — Z87891 Personal history of nicotine dependence: Secondary | ICD-10-CM | POA: Insufficient documentation

## 2013-12-29 DIAGNOSIS — Z7982 Long term (current) use of aspirin: Secondary | ICD-10-CM | POA: Diagnosis not present

## 2013-12-29 DIAGNOSIS — E785 Hyperlipidemia, unspecified: Secondary | ICD-10-CM | POA: Diagnosis not present

## 2013-12-29 DIAGNOSIS — E119 Type 2 diabetes mellitus without complications: Secondary | ICD-10-CM | POA: Insufficient documentation

## 2013-12-29 DIAGNOSIS — Z79899 Other long term (current) drug therapy: Secondary | ICD-10-CM | POA: Diagnosis not present

## 2013-12-29 DIAGNOSIS — Z859 Personal history of malignant neoplasm, unspecified: Secondary | ICD-10-CM | POA: Diagnosis not present

## 2013-12-29 DIAGNOSIS — Z794 Long term (current) use of insulin: Secondary | ICD-10-CM | POA: Diagnosis not present

## 2013-12-29 DIAGNOSIS — M62830 Muscle spasm of back: Secondary | ICD-10-CM

## 2013-12-29 MED ORDER — IBUPROFEN 200 MG PO TABS
600.0000 mg | ORAL_TABLET | Freq: Once | ORAL | Status: AC
  Administered 2013-12-29: 600 mg via ORAL

## 2013-12-29 MED ORDER — DIAZEPAM 5 MG PO TABS
5.0000 mg | ORAL_TABLET | Freq: Once | ORAL | Status: AC
  Administered 2013-12-29: 5 mg via ORAL
  Filled 2013-12-29: qty 1

## 2013-12-29 MED ORDER — OXYCODONE-ACETAMINOPHEN 5-325 MG PO TABS: 1.0000 | ORAL_TABLET | Freq: Four times a day (QID) | ORAL | Status: DC | PRN

## 2013-12-29 MED ORDER — IBUPROFEN 600 MG PO TABS: 600.0000 mg | ORAL_TABLET | Freq: Four times a day (QID) | ORAL | Status: DC | PRN

## 2013-12-29 MED ORDER — DIAZEPAM 5 MG PO TABS: 5.0000 mg | ORAL_TABLET | Freq: Three times a day (TID) | ORAL | Status: DC | PRN

## 2013-12-29 MED ORDER — OXYCODONE-ACETAMINOPHEN 5-325 MG PO TABS
1.0000 | ORAL_TABLET | Freq: Once | ORAL | Status: AC
  Administered 2013-12-29: 1 via ORAL
  Filled 2013-12-29: qty 1

## 2013-12-29 MED ORDER — IBUPROFEN 200 MG PO TABS
600.0000 mg | ORAL_TABLET | Freq: Once | ORAL | Status: DC
  Filled 2013-12-29 (×2): qty 3

## 2013-12-29 NOTE — Discharge Instructions (Signed)
Back Exercises Back exercises help treat and prevent back injuries. The goal of back exercises is to increase the strength of your abdominal and back muscles and the flexibility of your back. These exercises should be started when you no longer have back pain. Back exercises include:  Pelvic Tilt. Lie on your back with your knees bent. Tilt your pelvis until the lower part of your back is against the floor. Hold this position 5 to 10 sec and repeat 5 to 10 times.  Knee to Chest. Pull first 1 knee up against your chest and hold for 20 to 30 seconds, repeat this with the other knee, and then both knees. This may be done with the other leg straight or bent, whichever feels better.  Sit-Ups or Curl-Ups. Bend your knees 90 degrees. Start with tilting your pelvis, and do a partial, slow sit-up, lifting your trunk only 30 to 45 degrees off the floor. Take at least 2 to 3 seconds for each sit-up. Do not do sit-ups with your knees out straight. If partial sit-ups are difficult, simply do the above but with only tightening your abdominal muscles and holding it as directed.  Hip-Lift. Lie on your back with your knees flexed 90 degrees. Push down with your feet and shoulders as you raise your hips a couple inches off the floor; hold for 10 seconds, repeat 5 to 10 times.  Back arches. Lie on your stomach, propping yourself up on bent elbows. Slowly press on your hands, causing an arch in your low back. Repeat 3 to 5 times. Any initial stiffness and discomfort should lessen with repetition over time.  Shoulder-Lifts. Lie face down with arms beside your body. Keep hips and torso pressed to floor as you slowly lift your head and shoulders off the floor. Do not overdo your exercises, especially in the beginning. Exercises may cause you some mild back discomfort which lasts for a few minutes; however, if the pain is more severe, or lasts for more than 15 minutes, do not continue exercises until you see your caregiver.  Improvement with exercise therapy for back problems is slow.  See your caregivers for assistance with developing a proper back exercise program. Document Released: 08/04/2004 Document Revised: 09/19/2011 Document Reviewed: 04/28/2011 [facility] Patient Information 2015 [place], [place]. This information is not intended to replace advice given to you by your health care provider. Make sure you discuss any questions you have with your health care provider.  Lumbosacral Strain Lumbosacral strain is a strain of any of the parts that make up your lumbosacral vertebrae. Your lumbosacral vertebrae are the bones that make up the lower third of your backbone. Your lumbosacral vertebrae are held together by muscles and tough, fibrous tissue (ligaments).  CAUSES  A sudden blow to your back can cause lumbosacral strain. Also, anything that causes an excessive stretch of the muscles in the low back can cause this strain. This is typically seen when people exert themselves strenuously, fall, lift heavy objects, bend, or crouch repeatedly. RISK FACTORS  Physically demanding work.  Participation in pushing or pulling sports or sports that require a sudden twist of the back (tennis, golf, baseball).  Weight lifting.  Excessive lower back curvature.  Forward-tilted pelvis.  Weak back or abdominal muscles or both.  Tight hamstrings. SIGNS AND SYMPTOMS  Lumbosacral strain may cause pain in the area of your injury or pain that moves (radiates) down your leg.  DIAGNOSIS Your health care provider can often diagnose lumbosacral strain through a physical  exam. In some cases, you may need tests such as X-ray exams.  TREATMENT  Treatment for your lower back injury depends on many factors that your clinician will have to evaluate. However, most treatment will include the use of anti-inflammatory medicines. HOME CARE INSTRUCTIONS   Avoid hard physical activities (tennis, racquetball, waterskiing) if you are not in  proper physical condition for it. This may aggravate or create problems.  If you have a back problem, avoid sports requiring sudden body movements. Swimming and walking are generally safer activities.  Maintain good posture.  Maintain a healthy weight.  For acute conditions, you may put ice on the injured area.  Put ice in a plastic bag.  Place a towel between your skin and the bag.  Leave the ice on for 20 minutes, 2-3 times a day.  When the low back starts healing, stretching and strengthening exercises may be recommended. SEEK MEDICAL CARE IF:  Your back pain is getting worse.  You experience severe back pain not relieved with medicines. SEEK IMMEDIATE MEDICAL CARE IF:   You have numbness, tingling, weakness, or problems with the use of your arms or legs.  There is a change in bowel or bladder control.  You have increasing pain in any area of the body, including your belly (abdomen).  You notice shortness of breath, dizziness, or feel faint.  You feel sick to your stomach (nauseous), are throwing up (vomiting), or become sweaty.  You notice discoloration of your toes or legs, or your feet get very cold. MAKE SURE YOU:   Understand these instructions.  Will watch your condition.  Will get help right away if you are not doing well or get worse. Document Released: 04/06/2005 Document Revised: 07/02/2013 Document Reviewed: 02/13/2013 [facility] Patient Information 2015 [place], [place]. This information is not intended to replace advice given to you by your health care provider. Make sure you discuss any questions you have with your health care provider.  Muscle Cramps and Spasms Muscle cramps and spasms occur when a muscle or muscles tighten and you have no control over this tightening (involuntary muscle contraction). They are a common problem and can develop in any muscle. The most common place is in the calf muscles of the leg. Both muscle cramps and muscle spasms are  involuntary muscle contractions, but they also have differences:   Muscle cramps are sporadic and painful. They may last a few seconds to a quarter of an hour. Muscle cramps are often more forceful and last longer than muscle spasms.  Muscle spasms may or may not be painful. They may also last just a few seconds or much longer. CAUSES  It is uncommon for cramps or spasms to be due to a serious underlying problem. In many cases, the cause of cramps or spasms is unknown. Some common causes are:   Overexertion.   Overuse from repetitive motions (doing the same thing over and over).   Remaining in a certain position for a long period of time.   Improper preparation, form, or technique while performing a sport or activity.   Dehydration.   Injury.   Side effects of some medicines.   Abnormally low levels of the salts and ions in your blood (electrolytes), especially potassium and calcium. This could happen if you are taking water pills (diuretics) or you are pregnant.  Some underlying medical problems can make it more likely to develop cramps or spasms. These include, but are not limited to:   Diabetes.   Parkinson  disease.   Hormone disorders, such as thyroid problems.   Alcohol abuse.   Diseases specific to muscles, joints, and bones.   Blood vessel disease where not enough blood is getting to the muscles.  HOME CARE INSTRUCTIONS   Stay well hydrated. Drink enough water and fluids to keep your urine clear or pale yellow.  It may be helpful to massage, stretch, and relax the affected muscle.  For tight or tense muscles, use a warm towel, heating pad, or hot shower water directed to the affected area.  If you are sore or have pain after a cramp or spasm, applying ice to the affected area may relieve discomfort.  Put ice in a plastic bag.  Place a towel between your skin and the bag.  Leave the ice on for 15-20 minutes, 03-04 times a day.  Medicines used to  treat a known cause of cramps or spasms may help reduce their frequency or severity. Only take over-the-counter or prescription medicines as directed by your caregiver. SEEK MEDICAL CARE IF:  Your cramps or spasms get more severe, more frequent, or do not improve over time.  MAKE SURE YOU:   Understand these instructions.  Will watch your condition.  Will get help right away if you are not doing well or get worse. Document Released: 12/17/2001 Document Revised: 10/22/2012 Document Reviewed: 06/13/2012 [facility] Patient Information 2015 [place], [place]. This information is not intended to replace advice given to you by your health care provider. Make sure you discuss any questions you have with your health care provider.

## 2013-12-29 NOTE — ED Provider Notes (Signed)
TIME SEEN: 9:09 AM  CHIEF COMPLAINT: Back pain, spasm  HPI: Patient is a 78 year old male with history of diabetes, hyperlipidemia, prostate cancer status post TURP who presents the emergency department with back pain that he describes as a spasm in his right mid and lower back. No radiation of pain. Worse with movement. Better with staying still. He states that his pain started after he was a deep sea fishing on Wednesday, 4 days ago. He states he woke up the next morning and had pain in his back. Denies any known injury. No numbness, tingling or focal weakness. No bowel or bladder incontinence. No fever. No dysuria, hematuria, urinary frequency or urgency. He has had a history of a cervical fusion in the past but no other back surgery or injections. Patient has been taking tramadol that he had left over from another physician and his wife's Flexeril at home without relief. No chest or abdominal pain.  ROS: See HPI Constitutional: no fever  Eyes: no drainage  ENT: no runny nose   Cardiovascular:  no chest pain  Resp: no SOB  GI: no vomiting GU: no dysuria Integumentary: no rash  Allergy: no hives  Musculoskeletal: no leg swelling  Neurological: no slurred speech ROS otherwise negative  PAST MEDICAL HISTORY/PAST SURGICAL HISTORY:  Past Medical History  Diagnosis Date  . Cancer   . Diabetes mellitus without complication   . Hyperlipidemia     MEDICATIONS:  Prior to Admission medications   Medication Sig Start Date End Date Taking? Authorizing Delia Sitar  aspirin 81 MG tablet Take 81 mg by mouth daily.    Historical Khaiden Segreto, MD  clarithromycin (BIAXIN XL) 500 MG 24 hr tablet  07/24/13   Historical Graceson Nichelson, MD  insulin lispro (HUMALOG) 100 UNIT/ML injection Inject into the skin 3 (three) times daily before meals.    Historical Elecia Serafin, MD  Liraglutide (VICTOZA ) Inject into the skin.    Historical Kawika Bischoff, MD  metFORMIN (GLUCOPHAGE) 500 MG tablet Take 500 mg by mouth 2 (two) times  daily with a meal.    Historical Nancye Grumbine, MD  ondansetron (ZOFRAN-ODT) 4 MG disintegrating tablet  07/17/13   Historical Edit Ricciardelli, MD  pravastatin (PRAVACHOL) 10 MG tablet Take 10 mg by mouth daily.    Historical Johnsie Moscoso, MD  traMADol Veatrice Bourbon) 50 MG tablet  07/24/13   Historical Nikesh Teschner, MD    ALLERGIES:  No Known Allergies  SOCIAL HISTORY:  History  Substance Use Topics  . Smoking status: Former Smoker    Quit date: 02/13/1974  . Smokeless tobacco: Never Used  . Alcohol Use: No    FAMILY HISTORY: Family History  Problem Relation Age of Onset  . Cancer Father     liver  . Cancer Brother     pancreatic    EXAM: BP 137/102  Pulse 105  Temp(Src) 97.9 F (36.6 C)  Resp 22  SpO2 99% CONSTITUTIONAL: Alert and oriented and responds appropriately to questions. Well-appearing; well-nourished, patient is elderly but nontoxic, appears very uncomfortable when he is attempting to sit up or move HEAD: Normocephalic EYES: Conjunctivae clear, PERRL ENT: normal nose; no rhinorrhea; moist mucous membranes; pharynx without lesions noted NECK: Supple, no meningismus, no LAD  CARD: RRR; S1 and S2 appreciated; no murmurs, no clicks, no rubs, no gallops RESP: Normal chest excursion without splinting or tachypnea; breath sounds clear and equal bilaterally; no wheezes, no rhonchi, no rales,  ABD/GI: Normal bowel sounds; non-distended; soft, non-tender, no rebound, no guarding BACK:  The back appears normal and  there is no midline spinal tenderness or step-off or deformity, no CVA tenderness, patient is tender to palpation over the paraspinal musculature of the mid and lower right back EXT: Normal ROM in all joints; non-tender to palpation; no edema; normal capillary refill; no cyanosis    SKIN: Normal color for age and race; warm NEURO: Moves all extremities equally, strength 5/5 in all 4 extremity, negative straight leg raise, 2+ deep tendon reflexes in bilateral upper and lower extremities,  sensation to light touch intact diffusely PSYCH: The patient's mood and manner are appropriate. Grooming and personal hygiene are appropriate.  MEDICAL DECISION MAKING: Patient here with likely muscle spasm, strain. He has no midline spinal tenderness. No red flag symptoms. He is neurologically intact. He is hemodynamically stable. We'll give Percocet, Valium, ibuprofen and reassess. I am not concerned for cauda equina, spinal stenosis, epidural abscess, discitis, transverse myelitis at this time.  ED PROGRESS: Patient reports feeling much better after Percocet, Valium and ibuprofen. He is able to ambulate without difficulty. Will discharge home. Have discussed strict return precautions and supportive care instructions with patient and his wife who verbalized understanding and are comfortable with this plan.     [place], DO 12/29/13 1043

## 2013-12-29 NOTE — ED Notes (Signed)
Pt sts was deep sea fishing and hurt his back on Thursday, sts taken tramadol and flexeril without relief.

## 2013-12-29 NOTE — ED Notes (Signed)
Dr. Leonides Schanz notified that pt dropped first order of pills on floor and they were reordered. Tim, Agricultural consultant wasted dropped pills

## 2014-03-10 ENCOUNTER — Ambulatory Visit (INDEPENDENT_AMBULATORY_CARE_PROVIDER_SITE_OTHER): Payer: Self-pay | Admitting: Emergency Medicine

## 2014-03-10 VITALS — BP 122/64 | HR 71 | Temp 98.0°F | Resp 16 | Ht 67.5 in | Wt 212.0 lb

## 2014-03-10 DIAGNOSIS — Z0000 Encounter for general adult medical examination without abnormal findings: Secondary | ICD-10-CM

## 2014-03-10 NOTE — Progress Notes (Signed)
[facility] [address], [place] [address] [phone]  Date:  03/10/2014   Name:  Mark Elliott   DOB:  05/25/1937   MRN:  [id number]  PCP:  Dwan Bolt, MD    Chief Complaint: Employment Physical   History of Present Illness:  Mark Elliott is a 78 y.o. 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 Denies other complaint or health concern today.   There are no active problems to display for this patient.   Past Medical History  Diagnosis Date  . Cancer   . Diabetes mellitus without complication   . Hyperlipidemia     Past Surgical History  Procedure Laterality Date  . Anterior fusion cervical spine    . Hernia repair      History  Substance Use Topics  . Smoking status: Former Smoker    Quit date: 02/13/1974  . Smokeless tobacco: Never Used  . Alcohol Use: No    Family History  Problem Relation Age of Onset  . Cancer Father     liver  . Cancer Brother     pancreatic    No Known Allergies  Medication list has been reviewed and updated.  Current Outpatient Prescriptions on File Prior to Visit  Medication Sig Dispense Refill  . aspirin 81 MG tablet Take 81 mg by mouth every evening.       . diazepam (VALIUM) 5 MG tablet Take 1 tablet (5 mg total) by mouth every 8 (eight) hours as needed for muscle spasms.  10 tablet  0  . Empagliflozin (JARDIANCE) 25 MG TABS Take 25 mg by mouth daily.      Marland Kitchen ibuprofen (ADVIL,MOTRIN) 600 MG tablet Take 1 tablet (600 mg total) by mouth every 6 (six) hours as needed.  20 tablet  0  . insulin glargine (LANTUS) 100 UNIT/ML injection Inject 14 Units into the skin every morning.      . Liraglutide (VICTOZA [place]) Inject 1.8 mg into the skin daily.       . metFORMIN (GLUCOPHAGE) 500 MG tablet Take 500 mg by mouth 2 (two) times daily with a meal.      . oxyCODONE-acetaminophen (PERCOCET/ROXICET) 5-325 MG per tablet Take 1 tablet by mouth every 6 (six) hours as  needed (pain).  15 tablet  0  . pravastatin (PRAVACHOL) 10 MG tablet Take 10 mg by mouth every evening.        No current facility-administered medications on file prior to visit.    Review of Systems:  As per HPI, otherwise negative.    Physical Examination: Filed Vitals:   03/10/14 1518  BP: 122/64  Pulse: 71  Temp: 98 F (36.7 C)  Resp: 16   Filed Vitals:   03/10/14 1518  Height: 5' 7.5" (1.715 m)  Weight: 212 lb (96.163 kg)   Body mass index is 32.69 kg/(m^2). Ideal Body Weight: Weight in (lb) to have BMI = 25: 161.7  GEN: WDWN, NAD, Non-toxic, A & O x 3 HEENT: Atraumatic, Normocephalic. Neck supple. No masses, No LAD. Ears and Nose: No external deformity. CV: RRR, No M/G/R. No JVD. No thrill. No extra heart sounds. PULM: CTA B, no wheezes, crackles, rhonchi. No retractions. No resp. distress. No accessory muscle use. ABD: S, NT, ND, +BS. No rebound. No HSM. EXTR: No c/c/e NEURO Normal gait.  PSYCH: Normally interactive. Conversant. Not depressed or anxious appearing.  Calm demeanor.    Assessment and Plan:  DOT  Signed,  Ellison Carwin, MD

## 2014-07-23 DIAGNOSIS — Z125 Encounter for screening for malignant neoplasm of prostate: Secondary | ICD-10-CM | POA: Diagnosis not present

## 2014-07-23 DIAGNOSIS — Z79899 Other long term (current) drug therapy: Secondary | ICD-10-CM | POA: Diagnosis not present

## 2014-07-23 DIAGNOSIS — E118 Type 2 diabetes mellitus with unspecified complications: Secondary | ICD-10-CM | POA: Diagnosis not present

## 2014-07-23 DIAGNOSIS — E789 Disorder of lipoprotein metabolism, unspecified: Secondary | ICD-10-CM | POA: Diagnosis not present

## 2014-07-30 DIAGNOSIS — E118 Type 2 diabetes mellitus with unspecified complications: Secondary | ICD-10-CM | POA: Diagnosis not present

## 2014-07-30 DIAGNOSIS — E789 Disorder of lipoprotein metabolism, unspecified: Secondary | ICD-10-CM | POA: Diagnosis not present

## 2014-07-30 DIAGNOSIS — C61 Malignant neoplasm of prostate: Secondary | ICD-10-CM | POA: Diagnosis not present

## 2014-11-20 DIAGNOSIS — E118 Type 2 diabetes mellitus with unspecified complications: Secondary | ICD-10-CM | POA: Diagnosis not present

## 2014-12-02 DIAGNOSIS — M7061 Trochanteric bursitis, right hip: Secondary | ICD-10-CM | POA: Diagnosis not present

## 2014-12-02 DIAGNOSIS — M542 Cervicalgia: Secondary | ICD-10-CM | POA: Diagnosis not present

## 2014-12-02 DIAGNOSIS — M67912 Unspecified disorder of synovium and tendon, left shoulder: Secondary | ICD-10-CM | POA: Diagnosis not present

## 2014-12-02 DIAGNOSIS — M7062 Trochanteric bursitis, left hip: Secondary | ICD-10-CM | POA: Diagnosis not present

## 2014-12-04 DIAGNOSIS — E118 Type 2 diabetes mellitus with unspecified complications: Secondary | ICD-10-CM | POA: Diagnosis not present

## 2014-12-04 DIAGNOSIS — M542 Cervicalgia: Secondary | ICD-10-CM | POA: Diagnosis not present

## 2014-12-15 DIAGNOSIS — Z6833 Body mass index (BMI) 33.0-33.9, adult: Secondary | ICD-10-CM | POA: Diagnosis not present

## 2014-12-15 DIAGNOSIS — M4712 Other spondylosis with myelopathy, cervical region: Secondary | ICD-10-CM | POA: Diagnosis not present

## 2014-12-24 DIAGNOSIS — E118 Type 2 diabetes mellitus with unspecified complications: Secondary | ICD-10-CM | POA: Diagnosis not present

## 2014-12-25 DIAGNOSIS — E118 Type 2 diabetes mellitus with unspecified complications: Secondary | ICD-10-CM | POA: Diagnosis not present

## 2015-03-04 DIAGNOSIS — H40013 Open angle with borderline findings, low risk, bilateral: Secondary | ICD-10-CM | POA: Diagnosis not present

## 2015-03-04 DIAGNOSIS — H35033 Hypertensive retinopathy, bilateral: Secondary | ICD-10-CM | POA: Diagnosis not present

## 2015-03-04 DIAGNOSIS — E119 Type 2 diabetes mellitus without complications: Secondary | ICD-10-CM | POA: Diagnosis not present

## 2015-03-04 DIAGNOSIS — H04123 Dry eye syndrome of bilateral lacrimal glands: Secondary | ICD-10-CM | POA: Diagnosis not present

## 2015-03-04 DIAGNOSIS — H1013 Acute atopic conjunctivitis, bilateral: Secondary | ICD-10-CM | POA: Diagnosis not present

## 2015-03-30 DIAGNOSIS — E789 Disorder of lipoprotein metabolism, unspecified: Secondary | ICD-10-CM | POA: Diagnosis not present

## 2015-03-30 DIAGNOSIS — E118 Type 2 diabetes mellitus with unspecified complications: Secondary | ICD-10-CM | POA: Diagnosis not present

## 2015-04-13 DIAGNOSIS — Z23 Encounter for immunization: Secondary | ICD-10-CM | POA: Diagnosis not present

## 2015-04-13 DIAGNOSIS — E118 Type 2 diabetes mellitus with unspecified complications: Secondary | ICD-10-CM | POA: Diagnosis not present

## 2015-06-02 DIAGNOSIS — R05 Cough: Secondary | ICD-10-CM | POA: Diagnosis not present

## 2015-06-02 DIAGNOSIS — J329 Chronic sinusitis, unspecified: Secondary | ICD-10-CM | POA: Diagnosis not present

## 2015-09-04 DIAGNOSIS — I10 Essential (primary) hypertension: Secondary | ICD-10-CM | POA: Diagnosis not present

## 2015-09-07 DIAGNOSIS — R112 Nausea with vomiting, unspecified: Secondary | ICD-10-CM | POA: Diagnosis not present

## 2015-09-07 DIAGNOSIS — E782 Mixed hyperlipidemia: Secondary | ICD-10-CM | POA: Diagnosis not present

## 2015-09-07 DIAGNOSIS — E119 Type 2 diabetes mellitus without complications: Secondary | ICD-10-CM | POA: Diagnosis not present

## 2015-09-21 DIAGNOSIS — E118 Type 2 diabetes mellitus with unspecified complications: Secondary | ICD-10-CM | POA: Diagnosis not present

## 2015-09-21 DIAGNOSIS — Z125 Encounter for screening for malignant neoplasm of prostate: Secondary | ICD-10-CM | POA: Diagnosis not present

## 2015-09-21 DIAGNOSIS — E119 Type 2 diabetes mellitus without complications: Secondary | ICD-10-CM | POA: Diagnosis not present

## 2015-10-06 DIAGNOSIS — E789 Disorder of lipoprotein metabolism, unspecified: Secondary | ICD-10-CM | POA: Diagnosis not present

## 2015-10-06 DIAGNOSIS — N400 Enlarged prostate without lower urinary tract symptoms: Secondary | ICD-10-CM | POA: Diagnosis not present

## 2015-10-06 DIAGNOSIS — E118 Type 2 diabetes mellitus with unspecified complications: Secondary | ICD-10-CM | POA: Diagnosis not present

## 2016-02-01 ENCOUNTER — Telehealth: Payer: Self-pay

## 2016-02-01 ENCOUNTER — Ambulatory Visit (INDEPENDENT_AMBULATORY_CARE_PROVIDER_SITE_OTHER): Payer: Self-pay | Admitting: Urgent Care

## 2016-02-01 VITALS — BP 138/84 | HR 71 | Temp 97.8°F | Resp 17 | Ht 68.0 in | Wt 220.0 lb

## 2016-02-01 DIAGNOSIS — Z8639 Personal history of other endocrine, nutritional and metabolic disease: Secondary | ICD-10-CM

## 2016-02-01 DIAGNOSIS — Z021 Encounter for pre-employment examination: Secondary | ICD-10-CM

## 2016-02-01 LAB — POCT GLYCOSYLATED HEMOGLOBIN (HGB A1C): Hemoglobin A1C: 9.7

## 2016-02-01 NOTE — Patient Instructions (Signed)
     IF you received an x-ray today, you will receive an invoice from [facility]. Please contact [facility] at [phone] with questions or concerns regarding your invoice.   IF you received labwork today, you will receive an invoice from [facility]. Please contact [facility] at [phone] with questions or concerns regarding your invoice.   Our billing staff will not be able to assist you with questions regarding bills from these companies.  You will be contacted with the lab results as soon as they are available. The fastest way to get your results is to activate your My Chart account. Instructions are located on the last page of this paperwork. If you have not heard from us regarding the results in 2 weeks, please contact this office.      

## 2016-02-01 NOTE — Telephone Encounter (Signed)
Instructed by Rosario Adie PA-C, I called the pharmacy and got his current medication list because he had not reported to staff that he was on any diabetic medication.   I gave the list to [facility] and he was very Patent attorney.  Per Rosario Adie, PA-C I called patient and asked him had he left the office without being instructed by the provider.  He confirmed that he had left and stated that the provider was hard to deal with.  He stated that we could send him a bill.  I reported this to [facility].  He thanked me for calling.

## 2016-02-01 NOTE — Progress Notes (Signed)
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 The patient reports that is only taking baby aspirin daily. He states that he had a past diagnosis of diabetes but is not being treated for this anymore because he does not have diabetes.   The following portions of the patient's history were reviewed and updated as appropriate: allergies, current medications, past family history, past medical history, past social history and past surgical history.  Objective:   BP 138/84 (BP Location: Right Arm, Patient Position: Sitting, Cuff Size: Normal)   Pulse 71   Temp 97.8 F (36.6 C) (Oral)   Resp 17   Ht 5\' 8"  (1.727 m)   Wt 220 lb (99.8 kg)   SpO2 99%   BMI 33.45 kg/m   Vision/hearing:  Visual Acuity Screening   Right eye Left eye Both eyes  Without correction: 20/20 20/20-1 20/20  With correction:     Comments: Titmus test was 85 degrees in both eyes.  Hearing Screening Comments: Whisper test is 10 ft in both ears  Patient can recognize and distinguish among traffic control signals and devices showing standard red, green, and amber colors.  Corrective lenses required: No  Monocular Vision?: No  Hearing aid requirement: No  Physical Exam  Labs: Comments: SPGR:1.025,Glu:neg,Blood:neg,Pro:neg  Assessment:   No physical exam performed.   Does not meet standards in 49 CFR 391.41.    Plan:   Patient falsified his health history despite me having a discussion about needing to provide truthful information and not committing fraud. We called and tried to verify his medications with the pharmacy he reported using. This was not true. Medical assistant Renee tracked down the actual pharmacy he uses and they reported that he is on insulin, metformin and pravastatin. When I returned to the exam room, the patient had already left. I will report this to the DOT.

## 2016-02-01 NOTE — Progress Notes (Deleted)
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 The patient reports ***.  The following portions of the patient's history were reviewed and updated as appropriate: allergies, current medications, past family history, past medical history, past social history and past surgical history.  Objective:   BP 138/84 (BP Location: Right Arm, Patient Position: Sitting, Cuff Size: Normal)   Pulse 71   Temp 97.8 F (36.6 C) (Oral)   Resp 17   Ht 5\' 8"  (1.727 m)   Wt 220 lb (99.8 kg)   SpO2 99%   BMI 33.45 kg/m   Vision/hearing:  Visual Acuity Screening   Right eye Left eye Both eyes  Without correction: 20/20 20/20-1 20/20  With correction:     Comments: Titmus test was 85 degrees in both eyes.  Hearing Screening Comments: Whisper test is 10 ft in both ears  Patient can recognize and distinguish among traffic control signals and devices showing standard red, green, and amber colors.  Corrective lenses required: No  Monocular Vision?: No  Hearing aid requirement: No  Physical Exam  Constitutional: He is oriented to person, place, and time. He appears well-developed and well-nourished.  HENT:  TM's intact bilaterally, no effusions or erythema. Nasal turbinates pink and moist, nasal passages patent. No sinus tenderness. Oropharynx clear, mucous membranes moist, dentition in good repair.  Eyes: Conjunctivae and EOM are normal. Pupils are equal, round, and reactive to light. Right eye exhibits no discharge. Left eye exhibits no discharge. No scleral icterus.  Neck: Normal range of motion. Neck supple. No thyromegaly present.  Cardiovascular: Normal rate, regular rhythm and intact distal pulses.  Exam reveals no gallop and no friction rub.   No murmur heard. Pulmonary/Chest: No stridor. No respiratory distress. He has no wheezes. He has no rales.  Abdominal: Soft. Bowel sounds are normal. He exhibits no distension  and no mass. There is no tenderness.  Musculoskeletal: Normal range of motion. He exhibits no edema or tenderness.  Lymphadenopathy:    He has no cervical adenopathy.  Neurological: He is alert and oriented to person, place, and time. He has normal reflexes.  Skin: Skin is warm and dry. No rash noted. No erythema. No pallor.  Psychiatric: He has a normal mood and affect.    Labs: Comments: SPGR:1.025,Glu:neg,Blood:neg,Pro:neg  Results for orders placed or performed in visit on 02/01/16 (from the past 24 hour(s))  POCT glycosylated hemoglobin (Hb A1C)     Status: None   Collection Time: 02/01/16 10:15 AM  Result Value Ref Range   Hemoglobin A1C 9.7     Assessment:    Healthy male exam.  Meets standards, but periodic monitoring required due to DM, HL.  Driver qualified only for 6 months.    Plan:   [id number] examiners certificate completed and printed.","Return as needed."}

## 2016-04-12 DIAGNOSIS — M549 Dorsalgia, unspecified: Secondary | ICD-10-CM | POA: Diagnosis not present

## 2016-04-12 DIAGNOSIS — R079 Chest pain, unspecified: Secondary | ICD-10-CM | POA: Diagnosis not present

## 2016-06-30 DIAGNOSIS — E118 Type 2 diabetes mellitus with unspecified complications: Secondary | ICD-10-CM | POA: Diagnosis not present

## 2016-07-01 DIAGNOSIS — E118 Type 2 diabetes mellitus with unspecified complications: Secondary | ICD-10-CM | POA: Diagnosis not present

## 2016-07-07 ENCOUNTER — Other Ambulatory Visit: Payer: Self-pay | Admitting: *Deleted

## 2016-07-07 DIAGNOSIS — R0989 Other specified symptoms and signs involving the circulatory and respiratory systems: Secondary | ICD-10-CM

## 2016-08-18 ENCOUNTER — Encounter: Payer: Self-pay | Admitting: Vascular Surgery

## 2016-08-24 ENCOUNTER — Ambulatory Visit (HOSPITAL_COMMUNITY)
Admission: RE | Admit: 2016-08-24 | Discharge: 2016-08-24 | Disposition: A | Discharge status: Home / Self Care | Payer: Medicare Other | Source: Ambulatory Visit | Attending: Vascular Surgery | Admitting: Vascular Surgery

## 2016-08-24 ENCOUNTER — Ambulatory Visit (INDEPENDENT_AMBULATORY_CARE_PROVIDER_SITE_OTHER): Payer: Medicare Other | Admitting: Vascular Surgery

## 2016-08-24 VITALS — BP 162/86 | HR 76 | Temp 97.9°F | Resp 18 | Ht 66.0 in | Wt 215.0 lb

## 2016-08-24 DIAGNOSIS — E119 Type 2 diabetes mellitus without complications: Secondary | ICD-10-CM

## 2016-08-24 DIAGNOSIS — R0989 Other specified symptoms and signs involving the circulatory and respiratory systems: Secondary | ICD-10-CM | POA: Diagnosis not present

## 2016-08-24 DIAGNOSIS — Z794 Long term (current) use of insulin: Secondary | ICD-10-CM

## 2016-08-24 NOTE — Progress Notes (Signed)
[name], male   DOB: 05/25/1937, 81 y.o.   MRN: [id number]  Reason for Consult: New Evaluation (decreased pedal pulses)   Referred by Anda Kraft, MD  Subjective:     HPI:  Mark Elliott is a 81 y.o. male sent from his [place] healthcare nurse suggesting that he has poor vascular disease. He has no limitations to his walking. He is never had wound or ulceration was bilateral lower extremities denies claudication. He is a previous smoker is not smoking 50 years. No one in his family has had an aneurysm that he knows of. He is diabetic but controls this with insulin and metformin. He is followed by Dr. Gareth Eagle. He does follow regularly.  Past Medical History:  Diagnosis Date  . Cancer ([place])   . Diabetes mellitus without complication ([place])   . Hyperlipidemia    Family History  Problem Relation Age of Onset  . Cancer Father     liver  . Cancer Brother     pancreatic   Past Surgical History:  Procedure Laterality Date  . ANTERIOR FUSION CERVICAL SPINE    . HERNIA REPAIR      Short Social History:  Social History  Substance Use Topics  . Smoking status: Former Smoker    Quit date: 02/13/1974  . Smokeless tobacco: Never Used  . Alcohol use No    No Known Allergies  Current Outpatient Prescriptions  Medication Sig Dispense Refill  . ACCU-CHEK AVIVA PLUS test strip Inject 1 application as directed 2 (two) times daily.    Marland Kitchen aspirin 81 MG tablet Take 81 mg by mouth every evening.     . insulin glargine (LANTUS) 100 UNIT/ML injection Inject 14 Units into the skin every morning.    Marland Kitchen LANTUS SOLOSTAR 100 UNIT/ML Solostar Pen Inject 14 Units as directed daily.    . metFORMIN (GLUCOPHAGE-XR) 500 MG 24 hr tablet Take 2 tablets by mouth 2 (two) times daily.    . pravastatin (PRAVACHOL) 80 MG tablet Take 1 tablet by mouth daily.     No current facility-administered medications for this visit.     Review of Systems  Constitutional:  Constitutional  negative. HENT: HENT negative.  Eyes: Eyes negative.  Respiratory: Respiratory negative.  Cardiovascular: Cardiovascular negative.  Musculoskeletal: Musculoskeletal negative.  Skin: Skin negative.  Neurological: Neurological negative. Hematologic: Hematologic/lymphatic negative.  Psychiatric: Psychiatric negative.        Objective:  Objective   Vitals:   08/24/16 1128 08/24/16 1130  BP: (!) 162/81 (!) 162/86  Pulse: 76 76  Resp: 18   Temp: 97.9 F (36.6 C)   SpO2: 98%   Weight: 215 lb (97.5 kg)   Height: 5\' 6"  (1.676 m)    Body mass index is 34.7 kg/m.  Physical Exam  Constitutional: He is oriented to person, place, and time. He appears well-developed.  HENT:  Head: Normocephalic.  Eyes: Pupils are equal, round, and reactive to light.  Neck: Normal range of motion.  Cardiovascular: Normal rate.   Pulses:      Radial pulses are 2+ on the right side, and 2+ on the left side.       Dorsalis pedis pulses are 2+ on the right side, and 2+ on the left side.       Posterior tibial pulses are 2+ on the right side, and 2+ on the left side.  Pulmonary/Chest: Effort normal.  Abdominal: Soft. He exhibits no mass.  Musculoskeletal: Normal range of motion. He  exhibits no edema.  Neurological: He is alert and oriented to person, place, and time.  Skin: Skin is warm and dry.  Psychiatric: He has a normal mood and affect. His behavior is normal. Judgment and thought content normal.    Data: ABI is 1 bilaterally with triphasic waveforms.     Assessment/Plan:     81 year old male here for evaluation of [place] vascular disease. He does have palpable pulses in his bilateral feet has very healthy-appearing legs never having had a vascular interventions. I cannot feel a mass in his aorta and he does not have family history of aneurysm is a former smoker. I have encouraged him to walk as much as possible when he can follow-up  with [place] when necessary.     Waynetta Sandy MD [facility]

## 2016-08-24 NOTE — Progress Notes (Signed)
Vitals:   08/24/16 1128 08/24/16 1130  BP: (!) 162/81 (!) 162/81  Pulse: 76 76  Resp: 18   Temp: 97.9 F (36.6 C)   SpO2: 98%   Weight: 215 lb (97.5 kg)   Height: 5\' 6"  (1.676 m)

## 2016-10-27 DIAGNOSIS — C61 Malignant neoplasm of prostate: Secondary | ICD-10-CM | POA: Diagnosis not present

## 2016-11-14 DIAGNOSIS — R0609 Other forms of dyspnea: Secondary | ICD-10-CM | POA: Diagnosis not present

## 2016-11-14 DIAGNOSIS — R079 Chest pain, unspecified: Secondary | ICD-10-CM | POA: Diagnosis not present

## 2016-11-14 DIAGNOSIS — I4510 Unspecified right bundle-branch block: Secondary | ICD-10-CM | POA: Diagnosis not present

## 2016-11-15 ENCOUNTER — Emergency Department (HOSPITAL_COMMUNITY): Payer: Medicare Other

## 2016-11-15 ENCOUNTER — Observation Stay (HOSPITAL_COMMUNITY)
Admission: EM | Admit: 2016-11-15 | Discharge: 2016-11-15 | Disposition: A | Discharge status: Home / Self Care | Payer: Medicare Other | Attending: Cardiology | Admitting: Cardiology

## 2016-11-15 ENCOUNTER — Ambulatory Visit (HOSPITAL_COMMUNITY)
Admission: EM | Disposition: A | Discharge status: Home / Self Care | Payer: Self-pay | Source: Home / Self Care | Attending: Emergency Medicine

## 2016-11-15 ENCOUNTER — Encounter (HOSPITAL_COMMUNITY): Payer: Self-pay

## 2016-11-15 DIAGNOSIS — E1142 Type 2 diabetes mellitus with diabetic polyneuropathy: Secondary | ICD-10-CM | POA: Diagnosis not present

## 2016-11-15 DIAGNOSIS — Z794 Long term (current) use of insulin: Secondary | ICD-10-CM | POA: Insufficient documentation

## 2016-11-15 DIAGNOSIS — I200 Unstable angina: Secondary | ICD-10-CM | POA: Diagnosis not present

## 2016-11-15 DIAGNOSIS — E1151 Type 2 diabetes mellitus with diabetic peripheral angiopathy without gangrene: Secondary | ICD-10-CM | POA: Insufficient documentation

## 2016-11-15 DIAGNOSIS — I209 Angina pectoris, unspecified: Secondary | ICD-10-CM | POA: Diagnosis not present

## 2016-11-15 DIAGNOSIS — I25118 Atherosclerotic heart disease of native coronary artery with other forms of angina pectoris: Principal | ICD-10-CM | POA: Insufficient documentation

## 2016-11-15 DIAGNOSIS — I10 Essential (primary) hypertension: Secondary | ICD-10-CM | POA: Diagnosis not present

## 2016-11-15 DIAGNOSIS — Z87891 Personal history of nicotine dependence: Secondary | ICD-10-CM | POA: Insufficient documentation

## 2016-11-15 DIAGNOSIS — E785 Hyperlipidemia, unspecified: Secondary | ICD-10-CM | POA: Insufficient documentation

## 2016-11-15 DIAGNOSIS — R079 Chest pain, unspecified: Secondary | ICD-10-CM | POA: Diagnosis not present

## 2016-11-15 DIAGNOSIS — I2584 Coronary atherosclerosis due to calcified coronary lesion: Secondary | ICD-10-CM | POA: Diagnosis not present

## 2016-11-15 DIAGNOSIS — Z7982 Long term (current) use of aspirin: Secondary | ICD-10-CM | POA: Diagnosis not present

## 2016-11-15 HISTORY — PX: LEFT HEART CATH AND CORONARY ANGIOGRAPHY: CATH118249

## 2016-11-15 LAB — BASIC METABOLIC PANEL
Anion gap: 5 (ref 5–15)
BUN: 14 mg/dL (ref 6–20)
CO2: 27 mmol/L (ref 22–32)
Calcium: 9.2 mg/dL (ref 8.9–10.3)
Chloride: 102 mmol/L (ref 101–111)
Creatinine, Ser: 1.07 mg/dL (ref 0.61–1.24)
GFR calc Af Amer: >60 mL/min (ref >60)
GFR calc non Af Amer: >60 mL/min (ref >60)
Glucose, Bld: 337 mg/dL — ABNORMAL HIGH (ref 65–99)
Potassium: 4.6 mmol/L (ref 3.5–5.1)
Sodium: 134 mmol/L — ABNORMAL LOW (ref 135–145)

## 2016-11-15 LAB — CBC
HCT: 38.8 % — ABNORMAL LOW (ref 39.0–52.0)
HCT: 40.7 % (ref 39.0–52.0)
Hemoglobin: 12.9 g/dL — ABNORMAL LOW (ref 13.0–17.0)
Hemoglobin: 13.5 g/dL (ref 13.0–17.0)
MCH: 28.4 pg (ref 26.0–34.0)
MCH: 28.1 pg (ref 26.0–34.0)
MCHC: 33.2 g/dL (ref 30.0–36.0)
MCHC: 33.2 g/dL (ref 30.0–36.0)
MCV: 85.3 fL (ref 78.0–100.0)
MCV: 84.6 fL (ref 78.0–100.0)
Platelets: 245 10*3/uL (ref 150–400)
Platelets: 242 10*3/uL (ref 150–400)
RBC: 4.55 MIL/uL (ref 4.22–5.81)
RBC: 4.81 MIL/uL (ref 4.22–5.81)
RDW: 13.1 % (ref 11.5–15.5)
RDW: 12.7 % (ref 11.5–15.5)
WBC: 4.2 10*3/uL (ref 4.0–10.5)
WBC: 5 10*3/uL (ref 4.0–10.5)

## 2016-11-15 LAB — I-STAT TROPONIN, ED: Troponin i, poc: 0 ng/mL (ref 0.00–0.08)

## 2016-11-15 LAB — LIPID PANEL
Cholesterol: 131 mg/dL (ref 0–200)
HDL: 59 mg/dL (ref >40)
LDL Cholesterol: 62 mg/dL (ref 0–99)
Total CHOL/HDL Ratio: 2.2 RATIO
Triglycerides: 50 mg/dL (ref <150)
VLDL: 10 mg/dL (ref 0–40)

## 2016-11-15 LAB — PROTIME-INR
INR: 0.97
Prothrombin Time: 12.9 seconds (ref 11.4–15.2)

## 2016-11-15 LAB — GLUCOSE, CAPILLARY: Glucose-Capillary: 159 mg/dL — ABNORMAL HIGH (ref 65–99)

## 2016-11-15 LAB — TROPONIN I: Troponin I: <0.03 ng/mL (ref <0.03)

## 2016-11-15 LAB — TSH: TSH: 1.63 u[IU]/mL (ref 0.350–4.500)

## 2016-11-15 SURGERY — LEFT HEART CATH AND CORONARY ANGIOGRAPHY
Anesthesia: LOCAL

## 2016-11-15 MED ORDER — SODIUM CHLORIDE 0.9% FLUSH
3.0000 mL | Freq: Two times a day (BID) | INTRAVENOUS | Status: DC
  Administered 2016-11-15: 3 mL via INTRAVENOUS

## 2016-11-15 MED ORDER — ONDANSETRON HCL 4 MG/2ML IJ SOLN: 4.0000 mg | Freq: Four times a day (QID) | INTRAMUSCULAR | Status: DC | PRN

## 2016-11-15 MED ORDER — VALSARTAN-HYDROCHLOROTHIAZIDE 80-12.5 MG PO TABS
1.0000 | ORAL_TABLET | Freq: Every day | ORAL | 1 refills | Status: DC
Start: 2016-11-15 — End: 2018-06-25

## 2016-11-15 MED ORDER — MIDAZOLAM HCL 2 MG/2ML IJ SOLN
INTRAMUSCULAR | Status: DC | PRN
  Administered 2016-11-15: 2 mg via INTRAVENOUS

## 2016-11-15 MED ORDER — SODIUM CHLORIDE 0.9 % WEIGHT BASED INFUSION: 1.0000 mL/kg/h | INTRAVENOUS | Status: DC

## 2016-11-15 MED ORDER — FENTANYL CITRATE (PF) 100 MCG/2ML IJ SOLN
INTRAMUSCULAR | Status: DC | PRN
  Administered 2016-11-15: 25 ug via INTRAVENOUS

## 2016-11-15 MED ORDER — LIDOCAINE HCL (PF) 1 % IJ SOLN
INTRAMUSCULAR | Status: DC | PRN
Start: 2016-11-15 — End: 2016-11-15
  Administered 2016-11-15: 2 mL

## 2016-11-15 MED ORDER — IOPAMIDOL (ISOVUE-370) INJECTION 76%
INTRAVENOUS | Status: DC | PRN
Start: 2016-11-15 — End: 2016-11-15
  Administered 2016-11-15: 55 mL via INTRA_ARTERIAL

## 2016-11-15 MED ORDER — MIDAZOLAM HCL 2 MG/2ML IJ SOLN
INTRAMUSCULAR | Status: AC
  Filled 2016-11-15: qty 2

## 2016-11-15 MED ORDER — SODIUM CHLORIDE 0.9 % IV BOLUS (SEPSIS)
1000.0000 mL | Freq: Once | INTRAVENOUS | Status: AC
  Administered 2016-11-15: 1000 mL via INTRAVENOUS

## 2016-11-15 MED ORDER — INSULIN ASPART 100 UNIT/ML ~~LOC~~ SOLN: 3.0000 [IU] | Freq: Three times a day (TID) | SUBCUTANEOUS | Status: DC

## 2016-11-15 MED ORDER — VERAPAMIL HCL 2.5 MG/ML IV SOLN
INTRAVENOUS | Status: DC | PRN
  Administered 2016-11-15: 10 mL via INTRA_ARTERIAL

## 2016-11-15 MED ORDER — SODIUM CHLORIDE 0.9% FLUSH: 3.0000 mL | INTRAVENOUS | Status: DC | PRN

## 2016-11-15 MED ORDER — ACETAMINOPHEN 325 MG PO TABS
650.0000 mg | ORAL_TABLET | ORAL | Status: DC | PRN
Start: 2016-11-15 — End: 2016-11-15

## 2016-11-15 MED ORDER — METOPROLOL SUCCINATE ER 50 MG PO TB24
50.0000 mg | ORAL_TABLET | Freq: Every day | ORAL | 1 refills | Status: DC
Start: 2016-11-15 — End: 2019-01-17

## 2016-11-15 MED ORDER — HEPARIN (PORCINE) IN NACL 2-0.9 UNIT/ML-% IJ SOLN
INTRAMUSCULAR | Status: AC
  Filled 2016-11-15: qty 1000

## 2016-11-15 MED ORDER — NITROGLYCERIN 0.4 MG SL SUBL: 0.4000 mg | SUBLINGUAL_TABLET | SUBLINGUAL | Status: DC | PRN

## 2016-11-15 MED ORDER — SODIUM CHLORIDE 0.9% FLUSH: 3.0000 mL | Freq: Two times a day (BID) | INTRAVENOUS | Status: DC

## 2016-11-15 MED ORDER — SODIUM CHLORIDE 0.9 % WEIGHT BASED INFUSION: 3.0000 mL/kg/h | INTRAVENOUS | Status: DC

## 2016-11-15 MED ORDER — INSULIN ASPART 100 UNIT/ML ~~LOC~~ SOLN: 0.0000 [IU] | Freq: Three times a day (TID) | SUBCUTANEOUS | Status: DC

## 2016-11-15 MED ORDER — FENTANYL CITRATE (PF) 100 MCG/2ML IJ SOLN
INTRAMUSCULAR | Status: AC
  Filled 2016-11-15: qty 2

## 2016-11-15 MED ORDER — VERAPAMIL HCL 2.5 MG/ML IV SOLN
INTRAVENOUS | Status: AC
  Filled 2016-11-15: qty 2

## 2016-11-15 MED ORDER — SODIUM CHLORIDE 0.9 % IV SOLN: 250.0000 mL | INTRAVENOUS | Status: DC | PRN

## 2016-11-15 MED ORDER — LIRAGLUTIDE 18 MG/3ML ~~LOC~~ SOPN: 1.2000 mg | PEN_INJECTOR | Freq: Every day | SUBCUTANEOUS | Status: DC

## 2016-11-15 MED ORDER — HEPARIN SODIUM (PORCINE) 1000 UNIT/ML IJ SOLN
INTRAMUSCULAR | Status: AC
  Filled 2016-11-15: qty 1

## 2016-11-15 MED ORDER — NITROGLYCERIN 0.4 MG SL SUBL: 0.4000 mg | SUBLINGUAL_TABLET | SUBLINGUAL | 1 refills | Status: DC | PRN

## 2016-11-15 MED ORDER — HEPARIN (PORCINE) IN NACL 2-0.9 UNIT/ML-% IJ SOLN
INTRAMUSCULAR | Status: DC | PRN
  Administered 2016-11-15: 1000 mL

## 2016-11-15 MED ORDER — HEPARIN SODIUM (PORCINE) 1000 UNIT/ML IJ SOLN
INTRAMUSCULAR | Status: DC | PRN
Start: 2016-11-15 — End: 2016-11-15
  Administered 2016-11-15: 6000 [IU] via INTRAVENOUS

## 2016-11-15 MED ORDER — SODIUM CHLORIDE 0.9 % WEIGHT BASED INFUSION
1.0000 mL/kg/h | INTRAVENOUS | Status: AC
  Administered 2016-11-15: 1 mL/kg/h via INTRAVENOUS

## 2016-11-15 MED ORDER — HEPARIN SODIUM (PORCINE) 5000 UNIT/ML IJ SOLN: 5000.0000 [IU] | Freq: Three times a day (TID) | INTRAMUSCULAR | Status: DC

## 2016-11-15 MED ORDER — ASPIRIN EC 81 MG PO TBEC
81.0000 mg | DELAYED_RELEASE_TABLET | Freq: Every day | ORAL | Status: DC
  Administered 2016-11-15: 81 mg via ORAL
  Filled 2016-11-15: qty 1

## 2016-11-15 MED ORDER — LIDOCAINE HCL 1 % IJ SOLN
INTRAMUSCULAR | Status: AC
  Filled 2016-11-15: qty 20

## 2016-11-15 MED ORDER — INSULIN ASPART 100 UNIT/ML ~~LOC~~ SOLN: 0.0000 [IU] | Freq: Every day | SUBCUTANEOUS | Status: DC

## 2016-11-15 MED ORDER — METFORMIN HCL ER 500 MG PO TB24: 1000.0000 mg | ORAL_TABLET | Freq: Two times a day (BID) | ORAL | Status: DC

## 2016-11-15 MED ORDER — PRAVASTATIN SODIUM 40 MG PO TABS: 80.0000 mg | ORAL_TABLET | Freq: Every day | ORAL | Status: DC

## 2016-11-15 MED ORDER — NITROGLYCERIN 1 MG/10 ML FOR IR/CATH LAB
INTRA_ARTERIAL | Status: AC
  Filled 2016-11-15: qty 10

## 2016-11-15 SURGICAL SUPPLY — 10 items
CATH INFINITI 5FR AL1 (CATHETERS) ×1 IMPLANT
CATH OPTITORQUE TIG 4.0 5F (CATHETERS) ×1 IMPLANT
DEVICE RAD COMP TR BAND LRG (VASCULAR PRODUCTS) ×1 IMPLANT
GLIDESHEATH SLEND A-KIT 6F 20G (SHEATH) ×1 IMPLANT
GUIDEWIRE INQWIRE 1.5J.035X260 (WIRE) IMPLANT
INQWIRE 1.5J .035X260CM (WIRE) ×2
KIT HEART LEFT (KITS) ×2 IMPLANT
PACK CARDIAC CATHETERIZATION (CUSTOM PROCEDURE TRAY) ×2 IMPLANT
TRANSDUCER W/STOPCOCK (MISCELLANEOUS) ×2 IMPLANT
TUBING CIL FLEX 10 FLL-RA (TUBING) ×2 IMPLANT

## 2016-11-15 NOTE — ED Notes (Signed)
Pt updated on POC that waiting for cards; pillow given; no further needs.

## 2016-11-15 NOTE — H&P (Signed)
Mark Elliott is an 81 y.o. male.   Chief Complaint: Chest pain HPI: Mark Elliott  is a 81 y.o. male  With  hyperlipidemia, uncontrolled diabetes mellitus who is been complaining about chest discomfort for the past 2-3 months. Describes this as tightness to pressure-like sensation in the middle of the chest with radiation to both arms and also to the back. Pain usually comes with exertional activities and is relieved with rest for about 10-15 minutes. Today while he was driving to work, again had recurrence of chest pain associated with marked generalized weakness, hence he took a sublingual nitroglycerin that was given by his PCP with relief of chest discomfort at 4 minutes. He felt well although felt very weak and fatigued. He was brought to the emergency room for further evaluation.  Patient is also noticed marked dyspnea on exertion. States that even minimal activities brings on dyspnea. He denies symptoms suggestive of claudication although he has occasional tingling and numbness in his feet.  He denies any GI bleeding, no recent weight changes, had developed upper respiratory infection about a month ago and states that still has lingering cough. No recent planned surgeries. His diabetes continues to be uncontrolled.  Past Medical History:  Diagnosis Date  . Cancer ([place])   . Diabetes mellitus without complication ([place])   . Hyperlipidemia     Past Surgical History:  Procedure Laterality Date  . ANTERIOR FUSION CERVICAL SPINE    . HERNIA REPAIR      Family History  Problem Relation Age of Onset  . Cancer Father     liver  . Cancer Brother     pancreatic   Social History:  reports that he quit smoking about 42 years ago. He has never used smokeless tobacco. He reports that he does not drink alcohol or use drugs.  Allergies: No Known Allergies  Review of Systems - Please see history of present illness, has dyspnea on exertion, cough, no PND or orthopnea. No polyuria, no  dizziness or syncope, denies any major arthritis. Other systems negative.  Blood pressure (!) 137/105, pulse (!) 53, temperature 97.7 F (36.5 C), temperature source Oral, resp. rate 14, height _0  (1.676 m), weight 108.4 kg (239 lb), SpO2 100 %. Body mass index is 38.58 kg/m.  General appearance: alert, cooperative, appears stated age, no distress and moderately obese Eyes: negative findings: lids and lashes normal Neck: no adenopathy, no carotid bruit, no JVD, supple, symmetrical, trachea midline and thyroid not enlarged, symmetric, no tenderness/mass/nodules Neck: JVP - normal, carotids 2+= without bruits Resp: clear to auscultation bilaterally Chest wall: no tenderness Cardio: regular rate and rhythm, S1, S2 normal, no murmur, click, rub or gallop GI: soft, non-tender; bowel sounds normal; no masses,  no organomegaly and obese Extremities: extremities normal, atraumatic, no cyanosis or edema Pulses: 2+ and symmetric except left DP is faint Skin: Skin color, texture, turgor normal. No rashes or lesions Neurologic: Grossly normal  Results for orders placed or performed during the hospital encounter of 11/15/16 (from the past 48 hour(s))  Basic metabolic panel     Status: Abnormal   Collection Time: 11/15/16 10:16 AM  Result Value Ref Range   Sodium 134 (L) 135 - 145 mmol/L   Potassium 4.6 3.5 - 5.1 mmol/L   Chloride 102 101 - 111 mmol/L   CO2 27 22 - 32 mmol/L   Glucose, Bld 337 (H) 65 - 99 mg/dL   BUN 14 6 - 20 mg/dL   Creatinine, Ser  1.07 0.61 - 1.24 mg/dL   Calcium 9.2 8.9 - 10.3 mg/dL   GFR calc non Af Amer >60 >60 mL/min   GFR calc Af Amer >60 >60 mL/min    Comment: (NOTE) The eGFR has been calculated using the CKD EPI equation. This calculation has not been validated in all clinical situations. eGFR's persistently <60 mL/min signify possible Chronic Kidney Disease.    Anion gap 5 5 - 15  CBC     Status: Abnormal   Collection Time: 11/15/16 10:16 AM  Result Value  Ref Range   WBC 4.2 4.0 - 10.5 K/uL   RBC 4.55 4.22 - 5.81 MIL/uL   Hemoglobin 12.9 (L) 13.0 - 17.0 g/dL   HCT 38.8 (L) 39.0 - 52.0 %   MCV 85.3 78.0 - 100.0 fL   MCH 28.4 26.0 - 34.0 pg   MCHC 33.2 30.0 - 36.0 g/dL   RDW 13.1 11.5 - 15.5 %   Platelets 245 150 - 400 K/uL  I-stat troponin, ED     Status: None   Collection Time: 11/15/16 10:25 AM  Result Value Ref Range   Troponin i, poc 0.00 0.00 - 0.08 ng/mL   Comment 3            Comment: Due to the release kinetics of cTnI, a negative result within the first hours of the onset of symptoms does not rule out myocardial infarction with certainty. If myocardial infarction is still suspected, repeat the test at appropriate intervals.     Labs:   Lab Results  Component Value Date   WBC 4.2 11/15/2016   HGB 12.9 (L) 11/15/2016   HCT 38.8 (L) 11/15/2016   MCV 85.3 11/15/2016   PLT 245 11/15/2016    Recent Labs Lab 11/15/16 1016  NA 134*  K 4.6  CL 102  CO2 27  BUN 14  CREATININE 1.07  CALCIUM 9.2  GLUCOSE 337*    Lipid Panel  No results found for: CHOL, TRIG, HDL, CHOLHDL, VLDL, LDLCALC  BNP (last 3 results) No results for input(s): BNP in the last 8760 hours.  HEMOGLOBIN A1C Lab Results  Component Value Date   HGBA1C 9.7 02/01/2016    Cardiac Panel (last 3 results) No results for input(s): CKTOTAL, CKMB, TROPONINI, RELINDX in the last 8760 hours.  No results found for: CKTOTAL, CKMB, CKMBINDEX, TROPONINI   TSH No results for input(s): TSH in the last 8760 hours.    (Not in a hospital admission)    Current Facility-Administered Medications:  .  acetaminophen (TYLENOL) tablet 650 mg, 650 mg, Oral, Q4H PRN, [name], MD .  aspirin EC tablet 81 mg, 81 mg, Oral, Daily, [name], MD .  heparin injection 5,000 Units, 5,000 Units, Subcutaneous, Q8H, [name], MD .  nitroGLYCERIN (NITROSTAT) SL tablet 0.4 mg, 0.4 mg, Sublingual, Q5 Min x 3 PRN, [name], MD .  ondansetron (ZOFRAN) injection 4  mg, 4 mg, Intravenous, Q6H PRN, [name], MD  Current Outpatient Prescriptions:  .  ACCU-CHEK AVIVA PLUS test strip, Inject 1 application as directed 2 (two) times daily., Disp: , Rfl:  .  aspirin 81 MG tablet, Take 81 mg by mouth every evening. , Disp: , Rfl:  .  LANTUS SOLOSTAR 100 UNIT/ML Solostar Pen, Inject 14-16 Units as directed every morning. Patient uses sliding scale, Disp: , Rfl:  .  liraglutide (VICTOZA) 18 MG/3ML SOPN, Inject 1.2 mg into the skin daily., Disp: , Rfl:  .  metFORMIN (GLUCOPHAGE-XR) 500 MG 24  hr tablet, Take 2 tablets by mouth 2 (two) times daily., Disp: , Rfl:  .  pravastatin (PRAVACHOL) 80 MG tablet, Take 1 tablet by mouth daily., Disp: , Rfl:  .  Turmeric 500 MG CAPS, Take 500 mg by mouth daily., Disp: , Rfl:  .  insulin glargine (LANTUS) 100 UNIT/ML injection, Inject 14 Units into the skin every morning., Disp: , Rfl:   CARDIAC STUDIES:  EKG 11/15/2016: Normal sinus rhythm, left atrial abnormality, inferior infarct old. Right bundle branch block. LVH.  Assessment/Plan 1. Chest pain suggestive of chronic stable angina, worse today occurring at rest suggestive of ACS. 2. Diabetes mellitus type 2 uncontrolled on insulin with peripheral neuropathy and mild PAD.  3. Hyperlipidemia 4. Moderate obesity  Recommendation: Patient's presentation is consistent with ACS, still feels short of breath and fatigue although he is not having active chest pain. EKG does not reveal any acute ischemic changes, however due to multiple medical comorbidities and high risk clinical presentation, would recommend proceeding directly with coronary angiography. I'll repeat his lipid profile and be aggressive with his lipid control.  Discussed risks, benefits and alternatives of angiogram including but not limited to <1% risk of death, stroke, MI, need for urgent surgical revascularization, renal failure, but not limited to thest. patient is willing to proceed.   [name], MD 11/15/2016,  1:42 PM [facility] [place] Pager: [phone] Office: [phone] If no answer: Cell:  [phone]

## 2016-11-15 NOTE — Discharge Summary (Signed)
Physician Discharge Summary  [name] MRN: [id number] DOB/AGE: 79/14/1938 79 y.o.  Admit date: 11/15/2016 Discharge date: 11/15/2016  Primary Discharge Diagnosis 1. Chronic stable angina pectoris 2. Mild to moderate diffuse coronary artery disease with coronary calcification without high-grade stenosis. Ramus intermediate with a mid 60% and very small diagonal with a ostial 60-70% stenosis and otherwise mild disease.  Secondary Discharge Diagnosis Uncontrolled diabetes mellitus type 2 on insulin with peripheral neuropathy and mild PAD Hyperlipidemia Hypertension  Significant Diagnostic Studies: Coronary angiogram 11/15/2016: Diffuse coronary calcification involving all the coronary arteries. Very short left main with mild calcification. Ostial circumflex 40%, ostial ramus when he percent, ostial LAD 10-20% calcific stenosis. D1 ostial 60-70% stenosis. Small vessel. Mid ramus intermediate 60% stenosis. Mid RCA 30% and distal RCA 40% stenosis. Normal LVEF, EF estimated at 60%.  Hospital Course: Admitted through the emergency room with chest pain, please see my history and physical. Due to his multiple cardio vascular risk factors, abnormal EKG, he was taken to the cardiac catheterization lab which did not reveal any high-grade stenosis. Hence patient was felt stable for discharge.  Recommendations on discharge: His symptoms of chest pain at clearly more consistent with chronic stable angina pectoris. He needs aggressive risk factor modification with diet control of diabetes and also weight loss along with hypertension controlled. He'll be discharged home today with outpatient follow-up.  Discharge Exam: Blood pressure (!) 161/79, pulse (!) 0, temperature 97.7 F (36.5 C), temperature source Oral, resp. rate 10, height 5\' 6"  (1.676 m), weight 108.4 kg (238 lb 15.7 oz), SpO2 (!) 0 %.   General appearance: alert, cooperative, appears stated age, no distress and moderately  obese Resp: clear to auscultation bilaterally Cardio: regular rate and rhythm, S1, S2 normal, no murmur, click, rub or gallop GI: soft, non-tender; bowel sounds normal; no masses,  no organomegaly and obese Extremities: extremities normal, atraumatic, no cyanosis or edema Pulses: 2+ and symmetric except left AT is faint Neurologic: Grossly normal Labs:   Lab Results  Component Value Date   WBC 5.0 11/15/2016   HGB 13.5 11/15/2016   HCT 40.7 11/15/2016   MCV 84.6 11/15/2016   PLT 242 11/15/2016    Recent Labs Lab 11/15/16 1016  NA 134*  K 4.6  CL 102  CO2 27  BUN 14  CREATININE 1.07  CALCIUM 9.2  GLUCOSE 337*    Lipid Panel     Component Value Date/Time   CHOL 131 11/15/2016 1348   TRIG 50 11/15/2016 1348   HDL 59 11/15/2016 1348   CHOLHDL 2.2 11/15/2016 1348   VLDL 10 11/15/2016 1348   LDLCALC 62 11/15/2016 1348    BNP (last 3 results) No results for input(s): BNP in the last 8760 hours.  HEMOGLOBIN A1C Lab Results  Component Value Date   HGBA1C 9.7 02/01/2016    Cardiac Panel (last 3 results)  Lab Results  Component Value Date   TROPONINI <0.03 11/15/2016    TSH  Recent Labs  11/15/16 1400  TSH 1.630    EKG 11/15/2016: Normal sinus rhythm, left atrial abnormality, inferior infarct old. Right bundle branch block. LVH  Radiology: Dg Chest 2 View  Result Date: 11/15/2016 CLINICAL DATA:  Left-sided chest pain for 2 weeks. Shortness of breath. EXAM: CHEST  2 VIEW COMPARISON:  Two-view chest x-ray 04/12/2016 FINDINGS: The heart size and mediastinal contours are within normal limits. Both lungs are clear. The visualized skeletal structures are unremarkable. IMPRESSION: Negative two-view chest x-ray Electronically Signed  By: San Morelle M.D.   On: 11/15/2016 10:53   FOLLOW UP PLANS AND APPOINTMENTS  Allergies as of 11/15/2016   No Known Allergies     Medication List    TAKE these medications   ACCU-CHEK AVIVA PLUS test strip Generic  drug:  glucose blood Inject 1 application as directed 2 (two) times daily.   aspirin 81 MG tablet Take 81 mg by mouth every evening.   insulin glargine 100 UNIT/ML injection Commonly known as:  LANTUS Inject 14 Units into the skin every morning.   LANTUS SOLOSTAR 100 UNIT/ML Solostar Pen Generic drug:  Insulin Glargine Inject 14-16 Units as directed every morning. Patient uses sliding scale   metFORMIN 500 MG 24 hr tablet Commonly known as:  GLUCOPHAGE-XR Take 2 tablets (1,000 mg total) by mouth 2 (two) times daily. Start taking on:  11/17/2016   metoprolol succinate 50 MG 24 hr tablet Commonly known as:  TOPROL XL Take 1 tablet (50 mg total) by mouth daily. Take with or immediately following a meal.   nitroGLYCERIN 0.4 MG SL tablet Commonly known as:  NITROSTAT Place 1 tablet (0.4 mg total) under the tongue every 5 (five) minutes as needed for chest pain.   pravastatin 80 MG tablet Commonly known as:  PRAVACHOL Take 1 tablet by mouth daily.   Turmeric 500 MG Caps Take 500 mg by mouth daily.   valsartan-hydrochlorothiazide 80-12.5 MG tablet Commonly known as:  DIOVAN HCT Take 1 tablet by mouth daily.   VICTOZA 18 MG/3ML Sopn Generic drug:  liraglutide Inject 1.2 mg into the skin daily.      Follow-up Information    [name], MD Follow up on 11/30/2016.   Specialty:  Cardiology Why:  You will See Jeri Lager, NP at 2 pm. Bring all medications Contact information: [address] [place]  [phone]          [name], MD 11/15/2016, 4:56 PM  Pager: [phone] Office: [phone] If no answer: [phone]

## 2016-11-15 NOTE — ED Notes (Signed)
Cards at bedside

## 2016-11-15 NOTE — ED Triage Notes (Signed)
Pt. Was driving this morning and developed chest pressure that was non-radiating.  The pain took his breath away and he felt very weak to wear he pulled off the road and called his-son in law.  Pt. Took a Nitrol that eased the pain.;  Pt. Denies any pain at this time.  Pt. Reports that the chest pain, weakness has been going on for 2 weeks intermittently.  He reports that it is the worse in the morning.,   Pt. Is alert and oriented X.  Skin is warm and dry.   Pt. Was seen by his Endocrinologist yesterday and he has appointment with Dr. Einar Gip Tomorrow.  (This is his first visit).

## 2016-11-15 NOTE — ED Notes (Signed)
Patient transported to X-ray 

## 2016-11-15 NOTE — ED Provider Notes (Signed)
[facility] DEPT Provider Note   CSN: [id number] Arrival date & time: 11/15/16  4656     History   Chief Complaint Chief Complaint  Patient presents with  . Weakness  . Headache  . Shortness of Breath  . Chest Pain    HPI Mark Elliott is a 81 y.o. male.  HPI  Patient presents with concern of chest pain. Patient was well until a few days ago when he started noticing exertion and chest pain, with exertion. Since onset symptoms become worse, more easily provoked. During episodes, the patient has sternal chest tightness, which improves after he rests. No syncope, no vomiting, no diarrhea, no fever, no chills, no cough. No recent medication changes, diet changes. Patient has history of diabetes, hypertension, denies history of coronary disease. Patient was referred to see cardiology tomorrow, by his primary care physician yesterday, but after a similar, more pronounced episode earlier today, he presents for evaluation.    Past Medical History:  Diagnosis Date  . Cancer ([place])   . Diabetes mellitus without complication ([place])   . Hyperlipidemia     Patient Active Problem List   Diagnosis Date Noted  . Unstable angina ([place]) 11/15/2016    Past Surgical History:  Procedure Laterality Date  . ANTERIOR FUSION CERVICAL SPINE    . HERNIA REPAIR         Home Medications    Prior to Admission medications   Medication Sig Start Date End Date Taking? Authorizing Provider  ACCU-CHEK AVIVA PLUS test strip Inject 1 application as directed 2 (two) times daily. 12/01/15  Yes [provider]  aspirin 81 MG tablet Take 81 mg by mouth every evening.    Yes [provider]  LANTUS SOLOSTAR 100 UNIT/ML Solostar Pen Inject 14-16 Units as directed every morning. Patient uses sliding scale 01/11/16  Yes [provider]  liraglutide (VICTOZA) 18 MG/3ML SOPN Inject 1.2 mg into the skin daily.   Yes [provider]  metFORMIN (GLUCOPHAGE-XR) 500 MG  24 hr tablet Take 2 tablets by mouth 2 (two) times daily. 01/18/16  Yes [provider]  pravastatin (PRAVACHOL) 80 MG tablet Take 1 tablet by mouth daily. 11/10/15  Yes [provider]  Turmeric 500 MG CAPS Take 500 mg by mouth daily.   Yes [provider]  insulin glargine (LANTUS) 100 UNIT/ML injection Inject 14 Units into the skin every morning.    [provider]    Family History Family History  Problem Relation Age of Onset  . Cancer Father     liver  . Cancer Brother     pancreatic    Social History Social History  Substance Use Topics  . Smoking status: Former Smoker    Quit date: 02/13/1974  . Smokeless tobacco: Never Used  . Alcohol use No     Allergies   Patient has no known allergies.   Review of Systems Review of Systems  Constitutional:       Per HPI, otherwise negative  HENT:       Per HPI, otherwise negative  Respiratory:       Per HPI, otherwise negative  Cardiovascular:       Per HPI, otherwise negative  Gastrointestinal: Negative for vomiting.  Endocrine:       Negative aside from HPI  Genitourinary:       Neg aside from HPI   Musculoskeletal:       Per HPI, otherwise negative  Skin: Negative.   Neurological: Negative  for syncope.     Physical Exam Updated Vital Signs BP (!) 137/105   Pulse (!) 53   Temp 97.7 F (36.5 C) (Oral)   Resp 14   Ht 5\' 6"  (1.676 m)   Wt 239 lb (108.4 kg)   SpO2 100%   BMI 38.58 kg/m   Physical Exam  Constitutional: He is oriented to person, place, and time. He appears well-developed. No distress.  HENT:  Head: Normocephalic and atraumatic.  Eyes: Conjunctivae and EOM are normal.  Cardiovascular: Normal rate and regular rhythm.   Pulmonary/Chest: Effort normal. No stridor. No respiratory distress.  Abdominal: He exhibits no distension.  Musculoskeletal: He exhibits no edema.  Neurological: He is alert and oriented to person, place, and time.  Skin: Skin is warm and  dry.  Psychiatric: He has a normal mood and affect.  Nursing note and vitals reviewed.    ED Treatments / Results  Labs (all labs ordered are listed, but only abnormal results are displayed) Labs Reviewed  BASIC METABOLIC PANEL - Abnormal; Notable for the following:       Result Value   Sodium 134 (*)    Glucose, Bld 337 (*)    All other components within normal limits  CBC - Abnormal; Notable for the following:    Hemoglobin 12.9 (*)    HCT 38.8 (*)    All other components within normal limits  CBC  TSH  HEMOGLOBIN A1C  TROPONIN I  TROPONIN I  TROPONIN I  LIPID PANEL  PROTIME-INR  I-STAT TROPOININ, ED    EKG with sinus rhythm, left axis deviation, intraventricular conduction delay, T-wave inversions inferiorly, abnormal  Radiology Dg Chest 2 View  Result Date: 11/15/2016 CLINICAL DATA:  Left-sided chest pain for 2 weeks. Shortness of breath. EXAM: CHEST  2 VIEW COMPARISON:  Two-view chest x-ray 04/12/2016 FINDINGS: The heart size and mediastinal contours are within normal limits. Both lungs are clear. The visualized skeletal structures are unremarkable. IMPRESSION: Negative two-view chest x-ray Electronically Signed   By: San Morelle M.D.   On: 11/15/2016 10:53    Procedures Procedures (including critical care time)  Medications Ordered in ED Medications  aspirin EC tablet 81 mg (not administered)  nitroGLYCERIN (NITROSTAT) SL tablet 0.4 mg (not administered)  acetaminophen (TYLENOL) tablet 650 mg (not administered)  ondansetron (ZOFRAN) injection 4 mg (not administered)  heparin injection 5,000 Units (not administered)  sodium chloride 0.9 % bolus 1,000 mL (not administered)     Initial Impression / Assessment and Plan / ED Course  I have reviewed the triage vital signs and the nursing notes.  Pertinent labs & imaging results that were available during my care of the patient were reviewed by me and considered in my medical decision making (see chart  for details).  2:06 PM Initial labs reviewed with patient and his wife. I also discussed patient's case with his cardiologist, who will admit the patient, and catheterization is planned for this afternoon.   Final Clinical Impressions(s) / ED Diagnoses  Chest pain   Carmin Muskrat, MD 11/15/16 1408

## 2016-11-15 NOTE — Interval H&P Note (Signed)
History and Physical Interval Note:  11/15/2016 4:20 PM  Mark Elliott  has presented today for surgery, with the diagnosis of cp  The various methods of treatment have been discussed with the patient and family. After consideration of risks, benefits and other options for treatment, the patient has consented to  Procedure(s): Left Heart Cath and Coronary Angiography (N/A) and possible PCI  as a surgical intervention .  The patient's history has been reviewed, patient examined, no change in status, stable for surgery.  I have reviewed the patient's chart and labs.  Questions were answered to the patient's satisfaction.   Cath Lab Visit (complete for each Cath Lab visit)  Clinical Evaluation Leading to the Procedure:   ACS: Yes.    Non-ACS:    Anginal Classification: CCS IV  Anti-ischemic medical therapy: Minimal Therapy (1 class of medications)  Non-Invasive Test Results: No non-invasive testing performed  Prior CABG: No previous CABG        [name]

## 2016-11-15 NOTE — Progress Notes (Signed)
Discharged patient per MD order. TR band off for 1hr. Site clean and dry with occlusive dressing. AVS went over with family including follow up appointments and medications. Iv removed, telemetry d/c, CCMD notified.  Patient to home with spouse.  Cyndia Bent RN

## 2016-11-16 ENCOUNTER — Encounter (HOSPITAL_COMMUNITY): Payer: Self-pay | Admitting: Cardiology

## 2016-11-16 LAB — HEMOGLOBIN A1C
Hgb A1c MFr Bld: 8.7 % — ABNORMAL HIGH (ref 4.8–5.6)
Mean Plasma Glucose: 203 mg/dL

## 2016-11-16 MED FILL — Nitroglycerin IV Soln 100 MCG/ML in D5W: INTRA_ARTERIAL | Qty: 10 | Status: AC

## 2016-11-30 DIAGNOSIS — E1165 Type 2 diabetes mellitus with hyperglycemia: Secondary | ICD-10-CM | POA: Diagnosis not present

## 2016-11-30 DIAGNOSIS — Z794 Long term (current) use of insulin: Secondary | ICD-10-CM | POA: Diagnosis not present

## 2016-11-30 DIAGNOSIS — I25119 Atherosclerotic heart disease of native coronary artery with unspecified angina pectoris: Secondary | ICD-10-CM | POA: Diagnosis not present

## 2016-11-30 DIAGNOSIS — I10 Essential (primary) hypertension: Secondary | ICD-10-CM | POA: Diagnosis not present

## 2016-12-06 DIAGNOSIS — I2510 Atherosclerotic heart disease of native coronary artery without angina pectoris: Secondary | ICD-10-CM | POA: Diagnosis not present

## 2016-12-06 DIAGNOSIS — E118 Type 2 diabetes mellitus with unspecified complications: Secondary | ICD-10-CM | POA: Diagnosis not present

## 2017-01-03 DIAGNOSIS — E785 Hyperlipidemia, unspecified: Secondary | ICD-10-CM | POA: Diagnosis not present

## 2017-01-03 DIAGNOSIS — E118 Type 2 diabetes mellitus with unspecified complications: Secondary | ICD-10-CM | POA: Diagnosis not present

## 2017-01-03 DIAGNOSIS — I10 Essential (primary) hypertension: Secondary | ICD-10-CM | POA: Diagnosis not present

## 2017-01-13 DIAGNOSIS — Z794 Long term (current) use of insulin: Secondary | ICD-10-CM | POA: Diagnosis not present

## 2017-01-13 DIAGNOSIS — I25119 Atherosclerotic heart disease of native coronary artery with unspecified angina pectoris: Secondary | ICD-10-CM | POA: Diagnosis not present

## 2017-01-13 DIAGNOSIS — E1165 Type 2 diabetes mellitus with hyperglycemia: Secondary | ICD-10-CM | POA: Diagnosis not present

## 2017-01-13 DIAGNOSIS — I10 Essential (primary) hypertension: Secondary | ICD-10-CM | POA: Diagnosis not present

## 2017-02-08 DIAGNOSIS — Z0000 Encounter for general adult medical examination without abnormal findings: Secondary | ICD-10-CM | POA: Diagnosis not present

## 2017-02-08 DIAGNOSIS — E118 Type 2 diabetes mellitus with unspecified complications: Secondary | ICD-10-CM | POA: Diagnosis not present

## 2017-02-08 DIAGNOSIS — Z125 Encounter for screening for malignant neoplasm of prostate: Secondary | ICD-10-CM | POA: Diagnosis not present

## 2017-02-08 DIAGNOSIS — Z136 Encounter for screening for cardiovascular disorders: Secondary | ICD-10-CM | POA: Diagnosis not present

## 2017-02-10 DIAGNOSIS — I25119 Atherosclerotic heart disease of native coronary artery with unspecified angina pectoris: Secondary | ICD-10-CM | POA: Diagnosis not present

## 2017-02-10 DIAGNOSIS — I10 Essential (primary) hypertension: Secondary | ICD-10-CM | POA: Diagnosis not present

## 2017-02-10 DIAGNOSIS — Z794 Long term (current) use of insulin: Secondary | ICD-10-CM | POA: Diagnosis not present

## 2017-02-10 DIAGNOSIS — E1165 Type 2 diabetes mellitus with hyperglycemia: Secondary | ICD-10-CM | POA: Diagnosis not present

## 2017-03-03 DIAGNOSIS — Z794 Long term (current) use of insulin: Secondary | ICD-10-CM | POA: Diagnosis not present

## 2017-03-03 DIAGNOSIS — E1165 Type 2 diabetes mellitus with hyperglycemia: Secondary | ICD-10-CM | POA: Diagnosis not present

## 2017-03-03 DIAGNOSIS — Z0000 Encounter for general adult medical examination without abnormal findings: Secondary | ICD-10-CM | POA: Diagnosis not present

## 2017-03-03 DIAGNOSIS — Z23 Encounter for immunization: Secondary | ICD-10-CM | POA: Diagnosis not present

## 2017-03-03 DIAGNOSIS — E785 Hyperlipidemia, unspecified: Secondary | ICD-10-CM | POA: Diagnosis not present

## 2017-03-03 DIAGNOSIS — I10 Essential (primary) hypertension: Secondary | ICD-10-CM | POA: Diagnosis not present

## 2017-04-07 ENCOUNTER — Encounter: Payer: Self-pay | Admitting: Internal Medicine

## 2017-04-12 DIAGNOSIS — H40013 Open angle with borderline findings, low risk, bilateral: Secondary | ICD-10-CM | POA: Diagnosis not present

## 2017-04-12 DIAGNOSIS — Z961 Presence of intraocular lens: Secondary | ICD-10-CM | POA: Diagnosis not present

## 2017-04-12 DIAGNOSIS — H35033 Hypertensive retinopathy, bilateral: Secondary | ICD-10-CM | POA: Diagnosis not present

## 2017-04-12 DIAGNOSIS — E119 Type 2 diabetes mellitus without complications: Secondary | ICD-10-CM | POA: Diagnosis not present

## 2017-04-17 DIAGNOSIS — E785 Hyperlipidemia, unspecified: Secondary | ICD-10-CM | POA: Diagnosis not present

## 2017-04-17 DIAGNOSIS — E1165 Type 2 diabetes mellitus with hyperglycemia: Secondary | ICD-10-CM | POA: Diagnosis not present

## 2017-04-17 DIAGNOSIS — I10 Essential (primary) hypertension: Secondary | ICD-10-CM | POA: Diagnosis not present

## 2017-05-15 DIAGNOSIS — I10 Essential (primary) hypertension: Secondary | ICD-10-CM | POA: Diagnosis not present

## 2017-05-15 DIAGNOSIS — E1165 Type 2 diabetes mellitus with hyperglycemia: Secondary | ICD-10-CM | POA: Diagnosis not present

## 2017-05-15 DIAGNOSIS — Z794 Long term (current) use of insulin: Secondary | ICD-10-CM | POA: Diagnosis not present

## 2017-05-15 DIAGNOSIS — I25119 Atherosclerotic heart disease of native coronary artery with unspecified angina pectoris: Secondary | ICD-10-CM | POA: Diagnosis not present

## 2017-06-12 DIAGNOSIS — E785 Hyperlipidemia, unspecified: Secondary | ICD-10-CM | POA: Diagnosis not present

## 2017-06-12 DIAGNOSIS — E1165 Type 2 diabetes mellitus with hyperglycemia: Secondary | ICD-10-CM | POA: Diagnosis not present

## 2017-06-12 DIAGNOSIS — I10 Essential (primary) hypertension: Secondary | ICD-10-CM | POA: Diagnosis not present

## 2017-06-26 DIAGNOSIS — M71571 Other bursitis, not elsewhere classified, right ankle and foot: Secondary | ICD-10-CM | POA: Diagnosis not present

## 2017-06-26 DIAGNOSIS — M21612 Bunion of left foot: Secondary | ICD-10-CM | POA: Diagnosis not present

## 2017-06-26 DIAGNOSIS — M79672 Pain in left foot: Secondary | ICD-10-CM | POA: Diagnosis not present

## 2017-06-26 DIAGNOSIS — M722 Plantar fascial fibromatosis: Secondary | ICD-10-CM | POA: Diagnosis not present

## 2017-06-26 DIAGNOSIS — M21611 Bunion of right foot: Secondary | ICD-10-CM | POA: Diagnosis not present

## 2017-06-26 DIAGNOSIS — M79671 Pain in right foot: Secondary | ICD-10-CM | POA: Diagnosis not present

## 2017-06-26 DIAGNOSIS — M71572 Other bursitis, not elsewhere classified, left ankle and foot: Secondary | ICD-10-CM | POA: Diagnosis not present

## 2017-06-29 DIAGNOSIS — E1165 Type 2 diabetes mellitus with hyperglycemia: Secondary | ICD-10-CM | POA: Diagnosis not present

## 2017-06-29 DIAGNOSIS — E785 Hyperlipidemia, unspecified: Secondary | ICD-10-CM | POA: Diagnosis not present

## 2017-06-29 DIAGNOSIS — I10 Essential (primary) hypertension: Secondary | ICD-10-CM | POA: Diagnosis not present

## 2017-07-13 DIAGNOSIS — M79675 Pain in left toe(s): Secondary | ICD-10-CM | POA: Diagnosis not present

## 2017-07-13 DIAGNOSIS — B351 Tinea unguium: Secondary | ICD-10-CM | POA: Diagnosis not present

## 2017-07-13 DIAGNOSIS — M722 Plantar fascial fibromatosis: Secondary | ICD-10-CM | POA: Diagnosis not present

## 2017-07-13 DIAGNOSIS — M79674 Pain in right toe(s): Secondary | ICD-10-CM | POA: Diagnosis not present

## 2017-07-24 DIAGNOSIS — J069 Acute upper respiratory infection, unspecified: Secondary | ICD-10-CM | POA: Diagnosis not present

## 2017-08-28 DIAGNOSIS — I25119 Atherosclerotic heart disease of native coronary artery with unspecified angina pectoris: Secondary | ICD-10-CM | POA: Diagnosis not present

## 2017-08-28 DIAGNOSIS — R0989 Other specified symptoms and signs involving the circulatory and respiratory systems: Secondary | ICD-10-CM | POA: Diagnosis not present

## 2017-08-28 DIAGNOSIS — E119 Type 2 diabetes mellitus without complications: Secondary | ICD-10-CM | POA: Diagnosis not present

## 2017-08-28 DIAGNOSIS — I10 Essential (primary) hypertension: Secondary | ICD-10-CM | POA: Diagnosis not present

## 2017-09-13 DIAGNOSIS — R0989 Other specified symptoms and signs involving the circulatory and respiratory systems: Secondary | ICD-10-CM | POA: Diagnosis not present

## 2017-09-13 DIAGNOSIS — I25119 Atherosclerotic heart disease of native coronary artery with unspecified angina pectoris: Secondary | ICD-10-CM | POA: Diagnosis not present

## 2017-09-25 DIAGNOSIS — I10 Essential (primary) hypertension: Secondary | ICD-10-CM | POA: Diagnosis not present

## 2017-09-25 DIAGNOSIS — Z0189 Encounter for other specified special examinations: Secondary | ICD-10-CM | POA: Diagnosis not present

## 2017-09-25 DIAGNOSIS — R0989 Other specified symptoms and signs involving the circulatory and respiratory systems: Secondary | ICD-10-CM | POA: Diagnosis not present

## 2017-09-25 DIAGNOSIS — I25119 Atherosclerotic heart disease of native coronary artery with unspecified angina pectoris: Secondary | ICD-10-CM | POA: Diagnosis not present

## 2017-09-28 DIAGNOSIS — E1165 Type 2 diabetes mellitus with hyperglycemia: Secondary | ICD-10-CM | POA: Diagnosis not present

## 2017-09-28 DIAGNOSIS — E785 Hyperlipidemia, unspecified: Secondary | ICD-10-CM | POA: Diagnosis not present

## 2017-09-28 DIAGNOSIS — I10 Essential (primary) hypertension: Secondary | ICD-10-CM | POA: Diagnosis not present

## 2017-10-05 DIAGNOSIS — E1165 Type 2 diabetes mellitus with hyperglycemia: Secondary | ICD-10-CM | POA: Diagnosis not present

## 2017-10-05 DIAGNOSIS — I10 Essential (primary) hypertension: Secondary | ICD-10-CM | POA: Diagnosis not present

## 2017-10-05 DIAGNOSIS — E785 Hyperlipidemia, unspecified: Secondary | ICD-10-CM | POA: Diagnosis not present

## 2017-12-13 DIAGNOSIS — H04123 Dry eye syndrome of bilateral lacrimal glands: Secondary | ICD-10-CM | POA: Diagnosis not present

## 2017-12-13 DIAGNOSIS — H04221 Epiphora due to insufficient drainage, right lacrimal gland: Secondary | ICD-10-CM | POA: Diagnosis not present

## 2017-12-20 DIAGNOSIS — C61 Malignant neoplasm of prostate: Secondary | ICD-10-CM | POA: Diagnosis not present

## 2018-03-08 DIAGNOSIS — E785 Hyperlipidemia, unspecified: Secondary | ICD-10-CM | POA: Diagnosis not present

## 2018-03-08 DIAGNOSIS — E1165 Type 2 diabetes mellitus with hyperglycemia: Secondary | ICD-10-CM | POA: Diagnosis not present

## 2018-03-08 DIAGNOSIS — I10 Essential (primary) hypertension: Secondary | ICD-10-CM | POA: Diagnosis not present

## 2018-03-08 DIAGNOSIS — Z0000 Encounter for general adult medical examination without abnormal findings: Secondary | ICD-10-CM | POA: Diagnosis not present

## 2018-03-15 DIAGNOSIS — E1165 Type 2 diabetes mellitus with hyperglycemia: Secondary | ICD-10-CM | POA: Diagnosis not present

## 2018-03-15 DIAGNOSIS — Z23 Encounter for immunization: Secondary | ICD-10-CM | POA: Diagnosis not present

## 2018-03-15 DIAGNOSIS — I10 Essential (primary) hypertension: Secondary | ICD-10-CM | POA: Diagnosis not present

## 2018-03-15 DIAGNOSIS — Z0000 Encounter for general adult medical examination without abnormal findings: Secondary | ICD-10-CM | POA: Diagnosis not present

## 2018-03-15 DIAGNOSIS — E785 Hyperlipidemia, unspecified: Secondary | ICD-10-CM | POA: Diagnosis not present

## 2018-03-15 DIAGNOSIS — E118 Type 2 diabetes mellitus with unspecified complications: Secondary | ICD-10-CM | POA: Diagnosis not present

## 2018-03-21 DIAGNOSIS — I6523 Occlusion and stenosis of bilateral carotid arteries: Secondary | ICD-10-CM | POA: Diagnosis not present

## 2018-03-30 DIAGNOSIS — I25119 Atherosclerotic heart disease of native coronary artery with unspecified angina pectoris: Secondary | ICD-10-CM | POA: Diagnosis not present

## 2018-03-30 DIAGNOSIS — E119 Type 2 diabetes mellitus without complications: Secondary | ICD-10-CM | POA: Diagnosis not present

## 2018-03-30 DIAGNOSIS — I10 Essential (primary) hypertension: Secondary | ICD-10-CM | POA: Diagnosis not present

## 2018-03-30 DIAGNOSIS — I6522 Occlusion and stenosis of left carotid artery: Secondary | ICD-10-CM | POA: Diagnosis not present

## 2018-04-17 DIAGNOSIS — E119 Type 2 diabetes mellitus without complications: Secondary | ICD-10-CM | POA: Diagnosis not present

## 2018-04-17 DIAGNOSIS — H35033 Hypertensive retinopathy, bilateral: Secondary | ICD-10-CM | POA: Diagnosis not present

## 2018-04-17 DIAGNOSIS — H04221 Epiphora due to insufficient drainage, right lacrimal gland: Secondary | ICD-10-CM | POA: Diagnosis not present

## 2018-04-17 DIAGNOSIS — H40013 Open angle with borderline findings, low risk, bilateral: Secondary | ICD-10-CM | POA: Diagnosis not present

## 2018-06-24 ENCOUNTER — Emergency Department (HOSPITAL_COMMUNITY)
Admission: EM | Admit: 2018-06-24 | Discharge: 2018-06-25 | Disposition: A | Discharge status: Home / Self Care | Payer: Medicare Other | Attending: Emergency Medicine | Admitting: Emergency Medicine

## 2018-06-24 ENCOUNTER — Other Ambulatory Visit: Payer: Self-pay

## 2018-06-24 ENCOUNTER — Encounter (HOSPITAL_COMMUNITY): Payer: Self-pay

## 2018-06-24 DIAGNOSIS — Z7982 Long term (current) use of aspirin: Secondary | ICD-10-CM | POA: Diagnosis not present

## 2018-06-24 DIAGNOSIS — M5442 Lumbago with sciatica, left side: Secondary | ICD-10-CM | POA: Diagnosis not present

## 2018-06-24 DIAGNOSIS — Z7984 Long term (current) use of oral hypoglycemic drugs: Secondary | ICD-10-CM | POA: Insufficient documentation

## 2018-06-24 DIAGNOSIS — Z87891 Personal history of nicotine dependence: Secondary | ICD-10-CM | POA: Diagnosis not present

## 2018-06-24 DIAGNOSIS — E119 Type 2 diabetes mellitus without complications: Secondary | ICD-10-CM | POA: Diagnosis not present

## 2018-06-24 DIAGNOSIS — M5432 Sciatica, left side: Secondary | ICD-10-CM

## 2018-06-24 DIAGNOSIS — M545 Low back pain: Secondary | ICD-10-CM | POA: Diagnosis present

## 2018-06-24 DIAGNOSIS — Z79899 Other long term (current) drug therapy: Secondary | ICD-10-CM | POA: Insufficient documentation

## 2018-06-24 MED ORDER — HYDROMORPHONE HCL 1 MG/ML IJ SOLN
0.5000 mg | Freq: Once | INTRAMUSCULAR | Status: AC
  Administered 2018-06-24: 0.5 mg via INTRAVENOUS
  Filled 2018-06-24: qty 1

## 2018-06-24 MED ORDER — ONDANSETRON HCL 4 MG/2ML IJ SOLN
4.0000 mg | Freq: Once | INTRAMUSCULAR | Status: AC
  Administered 2018-06-24: 4 mg via INTRAVENOUS
  Filled 2018-06-24: qty 2

## 2018-06-24 NOTE — ED Notes (Signed)
Pt had one episode of emesis in the lobby tonight.

## 2018-06-24 NOTE — ED Triage Notes (Signed)
Pt reports L-sided lower back pain that radiates down through his hip and down his L leg. He reports 2 episodes of vomiting today. He states that he has a hx of same and usually gets a cortisone shot. A&Ox4.

## 2018-06-24 NOTE — ED Provider Notes (Signed)
[facility] DEPT Provider Note   CSN: [id number] Arrival date & time: 06/24/18  2233     History   Chief Complaint Chief Complaint  Patient presents with  . Back Pain  . Emesis    HPI Mark Elliott is a 82 y.o. adult.  Patient presents to the emergency department for evaluation of back pain.  Patient reports a history of sciatic several years ago with similar pain.  Over the last 3 to 4 days he started having pain in the left lower back that began to worsen and then radiate down the left leg.  He has been using Aspercreme and warm compresses with slight improvement.  Patient has not noticed any weakness, numbness or tingling of the leg.  He denies injury.  No change in bowel or bladder function.     Past Medical History:  Diagnosis Date  . Cancer ([place])   . Diabetes mellitus without complication ([place])   . Hyperlipidemia     Patient Active Problem List   Diagnosis Date Noted  . Unstable angina ([place]) 11/15/2016    Past Surgical History:  Procedure Laterality Date  . ANTERIOR FUSION CERVICAL SPINE    . HERNIA REPAIR    . LEFT HEART CATH AND CORONARY ANGIOGRAPHY N/A 11/15/2016   Procedure: Left Heart Cath and Coronary Angiography;  Surgeon: [name], MD;  Location: [facility];  Service: Cardiovascular;  Laterality: N/A;        Home Medications    Prior to Admission medications   Medication Sig Start Date End Date Taking? Authorizing Provider  ACCU-CHEK AVIVA PLUS test strip Inject 1 application as directed 2 (two) times daily. 12/01/15  Yes [provider]  aspirin 81 MG tablet Take 81 mg by mouth every evening.    Yes [provider]  LANTUS SOLOSTAR 100 UNIT/ML Solostar Pen Inject 14 Units as directed daily.  01/11/16  Yes [provider]  metFORMIN (GLUCOPHAGE-XR) 500 MG 24 hr tablet Take 2 tablets (1,000 mg total) by mouth 2 (two) times daily. 11/17/16  Yes [name], MD  metoprolol succinate  (TOPROL XL) 50 MG 24 hr tablet Take 1 tablet (50 mg total) by mouth daily. Take with or immediately following a meal. 11/15/16 06/25/18 Yes [name], MD  nitroGLYCERIN (NITROSTAT) 0.4 MG SL tablet Place 1 tablet (0.4 mg total) under the tongue every 5 (five) minutes as needed for chest pain. 11/15/16  Yes [name], MD  olmesartan (BENICAR) 20 MG tablet Take 20 mg by mouth daily.   Yes [provider]  pravastatin (PRAVACHOL) 80 MG tablet Take 80 mg by mouth every evening.  11/10/15  Yes [provider]  meloxicam (MOBIC) 7.5 MG tablet Take 1 tablet (7.5 mg total) by mouth daily. 06/25/18   Orpah Greek, MD  methocarbamol (ROBAXIN) 500 MG tablet Take 1 tablet (500 mg total) by mouth every 8 (eight) hours as needed for muscle spasms. 06/25/18   Orpah Greek, MD  oxyCODONE-acetaminophen (PERCOCET) 5-325 MG tablet Take 1-2 tablets by mouth every 4 (four) hours as needed. 06/25/18   Orpah Greek, MD    Family History Family History  Problem Relation Age of Onset  . Cancer Father        liver  . Cancer Brother        pancreatic    Social History Social History   Tobacco Use  . Smoking status: Former Smoker    Last attempt to quit: 02/13/1974  Years since quitting: 44.3  . Smokeless tobacco: Never Used  Substance Use Topics  . Alcohol use: No  . Drug use: No     Allergies   Patient has no known allergies.   Review of Systems Review of Systems  Musculoskeletal: Positive for back pain.  All other systems reviewed and are negative.    Physical Exam Updated Vital Signs BP (!) 157/71   Pulse (!) 59   Temp 98.5 F (36.9 C) (Oral)   Resp 18   SpO2 99%   Physical Exam Vitals signs and nursing note reviewed.  Constitutional:      General: She is not in acute distress.    Appearance: Normal appearance. She is well-developed.  HENT:     Head: Normocephalic and atraumatic.     Right Ear: Hearing normal.     Left Ear: Hearing  normal.     Nose: Nose normal.  Eyes:     Conjunctiva/sclera: Conjunctivae normal.     Pupils: Pupils are equal, round, and reactive to light.  Neck:     Musculoskeletal: Normal range of motion and neck supple.  Cardiovascular:     Rate and Rhythm: Regular rhythm.     Heart sounds: S1 normal and S2 normal. No murmur. No friction rub. No gallop.   Pulmonary:     Effort: Pulmonary effort is normal. No respiratory distress.     Breath sounds: Normal breath sounds.  Chest:     Chest wall: No tenderness.  Abdominal:     General: Bowel sounds are normal.     Palpations: Abdomen is soft.     Tenderness: There is no abdominal tenderness. There is no guarding or rebound. Negative signs include Murphy's sign and McBurney's sign.     Hernia: No hernia is present.  Musculoskeletal: Normal range of motion.     Lumbar back: She exhibits tenderness.       Back:  Skin:    General: Skin is warm and dry.     Findings: No rash.  Neurological:     Mental Status: She is alert and oriented to person, place, and time.     GCS: GCS eye subscore is 4. GCS verbal subscore is 5. GCS motor subscore is 6.     Cranial Nerves: No cranial nerve deficit.     Sensory: No sensory deficit.     Coordination: Coordination normal.     Deep Tendon Reflexes:     Reflex Scores:      Patellar reflexes are 1+ on the right side and 1+ on the left side.    Comments: Back pain reproduced with leg raise to 45 deg  Psychiatric:        Speech: Speech normal.        Behavior: Behavior normal.        Thought Content: Thought content normal.      ED Treatments / Results  Labs (all labs ordered are listed, but only abnormal results are displayed) Labs Reviewed  CBG MONITORING, ED    EKG None  Radiology No results found.  Procedures Procedures (including critical care time)  Medications Ordered in ED Medications  dexamethasone (DECADRON) injection 10 mg (has no administration in time range)    oxyCODONE-acetaminophen (PERCOCET/ROXICET) 5-325 MG per tablet 1 tablet (has no administration in time range)  ondansetron (ZOFRAN) injection 4 mg (4 mg Intravenous Given 06/24/18 2358)  HYDROmorphone (DILAUDID) injection 0.5 mg (0.5 mg Intravenous Given 06/24/18 2358)  HYDROmorphone (DILAUDID) injection 1 mg (  1 mg Intravenous Given 06/25/18 0051)     Initial Impression / Assessment and Plan / ED Course  I have reviewed the triage vital signs and the nursing notes.  Pertinent labs & imaging results that were available during my care of the patient were reviewed by me and considered in my medical decision making (see chart for details).     Patient presents to the ER with musculoskeletal back pain. Examination reveals back tenderness without any associated neurologic findings. Patient's strength, sensation and reflexes were normal. There is no evidence of saddle anesthesia. Patient does not have a foot drop. Patient has not experienced any change in bowel or bladder function. As such, patient did not require any imaging or further studies. Patient was treated with analgesia.  He has had significant improvement.  Will maintain with Mobic, Robaxin, Percocet.  He was given Decadron here in the ER.  At this point would like to avoid ongoing steroid use as he is a diabetic.  Final Clinical Impressions(s) / ED Diagnoses   Final diagnoses:  Sciatica of left side    ED Discharge Orders         Ordered    methocarbamol (ROBAXIN) 500 MG tablet  Every 8 hours PRN     06/25/18 0251    meloxicam (MOBIC) 7.5 MG tablet  Daily     06/25/18 0251    oxyCODONE-acetaminophen (PERCOCET) 5-325 MG tablet  Every 4 hours PRN     06/25/18 0251           Orpah Greek, MD 06/25/18 [phone]

## 2018-06-24 NOTE — ED Notes (Signed)
Patient in the restroom at this time. 

## 2018-06-25 LAB — CBG MONITORING, ED: Glucose-Capillary: 138 mg/dL — ABNORMAL HIGH (ref 70–99)

## 2018-06-25 MED ORDER — DEXAMETHASONE SODIUM PHOSPHATE 10 MG/ML IJ SOLN
10.0000 mg | Freq: Once | INTRAMUSCULAR | Status: AC
  Administered 2018-06-25: 10 mg via INTRAVENOUS
  Filled 2018-06-25: qty 1

## 2018-06-25 MED ORDER — METHOCARBAMOL 500 MG PO TABS: 500.0000 mg | ORAL_TABLET | Freq: Three times a day (TID) | ORAL | 0 refills | Status: DC | PRN

## 2018-06-25 MED ORDER — OXYCODONE-ACETAMINOPHEN 5-325 MG PO TABS
1.0000 | ORAL_TABLET | Freq: Once | ORAL | Status: AC
  Administered 2018-06-25: 1 via ORAL
  Filled 2018-06-25: qty 1

## 2018-06-25 MED ORDER — MELOXICAM 7.5 MG PO TABS: 7.5000 mg | ORAL_TABLET | Freq: Every day | ORAL | 0 refills | Status: DC

## 2018-06-25 MED ORDER — HYDROMORPHONE HCL 1 MG/ML IJ SOLN
1.0000 mg | Freq: Once | INTRAMUSCULAR | Status: AC
  Administered 2018-06-25: 1 mg via INTRAVENOUS
  Filled 2018-06-25: qty 1

## 2018-06-25 MED ORDER — OXYCODONE-ACETAMINOPHEN 5-325 MG PO TABS: 1.0000 | ORAL_TABLET | ORAL | 0 refills | Status: DC | PRN

## 2018-06-29 ENCOUNTER — Other Ambulatory Visit: Payer: Self-pay | Admitting: Orthopedic Surgery

## 2018-06-29 ENCOUNTER — Telehealth: Payer: Self-pay | Admitting: Nurse Practitioner

## 2018-06-29 DIAGNOSIS — M545 Low back pain, unspecified: Secondary | ICD-10-CM

## 2018-06-29 DIAGNOSIS — M25552 Pain in left hip: Secondary | ICD-10-CM | POA: Diagnosis not present

## 2018-06-29 DIAGNOSIS — G8929 Other chronic pain: Secondary | ICD-10-CM

## 2018-06-29 NOTE — Telephone Encounter (Signed)
Phone call to patient to verify medication list and allergies for myelogram procedure. Pt aware he will not need to hold any medications for this procedure.  

## 2018-07-12 DIAGNOSIS — E1165 Type 2 diabetes mellitus with hyperglycemia: Secondary | ICD-10-CM | POA: Diagnosis not present

## 2018-07-12 DIAGNOSIS — E785 Hyperlipidemia, unspecified: Secondary | ICD-10-CM | POA: Diagnosis not present

## 2018-07-12 DIAGNOSIS — I10 Essential (primary) hypertension: Secondary | ICD-10-CM | POA: Diagnosis not present

## 2018-07-13 ENCOUNTER — Other Ambulatory Visit: Payer: Self-pay | Admitting: Psychiatry

## 2018-07-16 ENCOUNTER — Ambulatory Visit
Admission: RE | Admit: 2018-07-16 | Discharge: 2018-07-16 | Disposition: A | Discharge status: Home / Self Care | Payer: Medicare Other | Source: Ambulatory Visit | Attending: Orthopedic Surgery | Admitting: Orthopedic Surgery

## 2018-07-16 DIAGNOSIS — M545 Low back pain, unspecified: Secondary | ICD-10-CM

## 2018-07-16 DIAGNOSIS — I700 Atherosclerosis of aorta: Secondary | ICD-10-CM | POA: Diagnosis not present

## 2018-07-16 DIAGNOSIS — G8929 Other chronic pain: Secondary | ICD-10-CM

## 2018-07-16 DIAGNOSIS — M48061 Spinal stenosis, lumbar region without neurogenic claudication: Secondary | ICD-10-CM | POA: Diagnosis not present

## 2018-07-16 MED ORDER — IOHEXOL 180 MG/ML  SOLN
15.0000 mL | Freq: Once | INTRAMUSCULAR | Status: AC | PRN
  Administered 2018-07-16: 15 mL via INTRATHECAL

## 2018-07-16 MED ORDER — IOPAMIDOL (ISOVUE-M 200) INJECTION 41%: 15.0000 mL | Freq: Once | INTRAMUSCULAR | Status: DC

## 2018-07-16 MED ORDER — DIAZEPAM 5 MG PO TABS
5.0000 mg | ORAL_TABLET | Freq: Once | ORAL | Status: AC
  Administered 2018-07-16: 5 mg via ORAL

## 2018-07-16 MED ORDER — ONDANSETRON HCL 4 MG/2ML IJ SOLN: 4.0000 mg | Freq: Four times a day (QID) | INTRAMUSCULAR | Status: DC | PRN

## 2018-07-16 NOTE — Discharge Instructions (Signed)

## 2018-07-23 DIAGNOSIS — M48061 Spinal stenosis, lumbar region without neurogenic claudication: Secondary | ICD-10-CM | POA: Diagnosis not present

## 2018-07-26 DIAGNOSIS — E1165 Type 2 diabetes mellitus with hyperglycemia: Secondary | ICD-10-CM | POA: Diagnosis not present

## 2018-07-26 DIAGNOSIS — E118 Type 2 diabetes mellitus with unspecified complications: Secondary | ICD-10-CM | POA: Diagnosis not present

## 2018-07-26 DIAGNOSIS — E785 Hyperlipidemia, unspecified: Secondary | ICD-10-CM | POA: Diagnosis not present

## 2018-07-26 DIAGNOSIS — I10 Essential (primary) hypertension: Secondary | ICD-10-CM | POA: Diagnosis not present

## 2018-07-30 DIAGNOSIS — M545 Low back pain: Secondary | ICD-10-CM | POA: Diagnosis not present

## 2018-08-06 DIAGNOSIS — M545 Low back pain: Secondary | ICD-10-CM | POA: Diagnosis not present

## 2018-08-15 DIAGNOSIS — M545 Low back pain: Secondary | ICD-10-CM | POA: Diagnosis not present

## 2018-08-22 ENCOUNTER — Other Ambulatory Visit: Payer: Self-pay | Admitting: Cardiology

## 2018-08-22 DIAGNOSIS — I6522 Occlusion and stenosis of left carotid artery: Secondary | ICD-10-CM

## 2018-08-23 DIAGNOSIS — M545 Low back pain: Secondary | ICD-10-CM | POA: Diagnosis not present

## 2018-09-03 DIAGNOSIS — M48061 Spinal stenosis, lumbar region without neurogenic claudication: Secondary | ICD-10-CM | POA: Diagnosis not present

## 2018-09-10 DIAGNOSIS — Z794 Long term (current) use of insulin: Secondary | ICD-10-CM | POA: Diagnosis not present

## 2018-09-10 DIAGNOSIS — E119 Type 2 diabetes mellitus without complications: Secondary | ICD-10-CM | POA: Diagnosis not present

## 2018-09-10 DIAGNOSIS — E7800 Pure hypercholesterolemia, unspecified: Secondary | ICD-10-CM | POA: Diagnosis not present

## 2018-09-10 DIAGNOSIS — I10 Essential (primary) hypertension: Secondary | ICD-10-CM | POA: Diagnosis not present

## 2018-09-19 ENCOUNTER — Other Ambulatory Visit: Payer: Self-pay

## 2018-09-19 ENCOUNTER — Telehealth: Payer: Self-pay

## 2018-09-19 MED ORDER — ISOSORBIDE MONONITRATE ER 60 MG PO TB24: 60.0000 mg | ORAL_TABLET | Freq: Every day | ORAL | 2 refills | Status: DC

## 2018-09-19 NOTE — Telephone Encounter (Signed)
vm left for patient to call back. Patient missed todays GXT 09/19/18.

## 2018-09-20 ENCOUNTER — Other Ambulatory Visit: Payer: Self-pay

## 2018-09-28 ENCOUNTER — Ambulatory Visit: Payer: Medicare Other

## 2018-09-28 ENCOUNTER — Ambulatory Visit: Payer: Self-pay | Admitting: Cardiology

## 2018-09-28 ENCOUNTER — Other Ambulatory Visit: Payer: Self-pay

## 2018-09-28 DIAGNOSIS — I6522 Occlusion and stenosis of left carotid artery: Secondary | ICD-10-CM

## 2018-10-09 ENCOUNTER — Ambulatory Visit: Payer: Self-pay | Admitting: Cardiology

## 2018-10-25 ENCOUNTER — Telehealth: Payer: Self-pay

## 2018-10-25 MED ORDER — NITROGLYCERIN 0.4 MG SL SUBL: 0.4000 mg | SUBLINGUAL_TABLET | SUBLINGUAL | 1 refills | Status: DC | PRN

## 2018-10-25 NOTE — Telephone Encounter (Signed)
Refill for nitroglycerin sent.

## 2018-11-13 DIAGNOSIS — E7800 Pure hypercholesterolemia, unspecified: Secondary | ICD-10-CM | POA: Diagnosis not present

## 2018-11-13 DIAGNOSIS — I10 Essential (primary) hypertension: Secondary | ICD-10-CM | POA: Diagnosis not present

## 2018-11-13 DIAGNOSIS — Z794 Long term (current) use of insulin: Secondary | ICD-10-CM | POA: Diagnosis not present

## 2018-11-13 DIAGNOSIS — E1169 Type 2 diabetes mellitus with other specified complication: Secondary | ICD-10-CM | POA: Diagnosis not present

## 2018-11-19 ENCOUNTER — Other Ambulatory Visit: Payer: Self-pay | Admitting: Cardiology

## 2018-11-19 NOTE — Telephone Encounter (Signed)
Please fill

## 2018-11-23 ENCOUNTER — Other Ambulatory Visit: Payer: Self-pay | Admitting: Cardiology

## 2018-11-26 NOTE — Telephone Encounter (Signed)
Please fill

## 2018-12-05 ENCOUNTER — Other Ambulatory Visit: Payer: Self-pay | Admitting: Cardiology

## 2018-12-05 NOTE — Telephone Encounter (Signed)
Please fill

## 2018-12-06 ENCOUNTER — Other Ambulatory Visit: Payer: Self-pay | Admitting: Cardiology

## 2018-12-07 NOTE — Telephone Encounter (Signed)
Please fill

## 2018-12-28 DIAGNOSIS — E1169 Type 2 diabetes mellitus with other specified complication: Secondary | ICD-10-CM | POA: Diagnosis not present

## 2018-12-28 DIAGNOSIS — I10 Essential (primary) hypertension: Secondary | ICD-10-CM | POA: Diagnosis not present

## 2018-12-28 DIAGNOSIS — E7800 Pure hypercholesterolemia, unspecified: Secondary | ICD-10-CM | POA: Diagnosis not present

## 2019-01-01 ENCOUNTER — Telehealth: Payer: Self-pay

## 2019-01-01 NOTE — Telephone Encounter (Signed)
Erica-nurse prac. Called stating that pt had a circulation test which resulted possible severe blockage.  Left foot 0.17 severe Right 0.70 moderate They are supposed to call for severe. Danae Chen can be reached at [phone] if needed.//ah

## 2019-01-07 ENCOUNTER — Telehealth: Payer: Self-pay

## 2019-01-07 NOTE — Telephone Encounter (Signed)
-----   Message from [name], MD sent at 01/02/2019  8:46 AM EDT ----- Regarding: Abnormal ABI per insurance NP.

## 2019-01-17 ENCOUNTER — Other Ambulatory Visit: Payer: Self-pay

## 2019-01-17 MED ORDER — OLMESARTAN MEDOXOMIL 20 MG PO TABS: 10.0000 mg | ORAL_TABLET | Freq: Every day | ORAL | 0 refills | Status: DC

## 2019-01-17 MED ORDER — METOPROLOL SUCCINATE ER 50 MG PO TB24: 50.0000 mg | ORAL_TABLET | Freq: Every day | ORAL | 0 refills | Status: DC

## 2019-01-17 MED ORDER — NITROGLYCERIN 0.4 MG SL SUBL: 0.4000 mg | SUBLINGUAL_TABLET | SUBLINGUAL | 1 refills | Status: DC | PRN

## 2019-03-07 ENCOUNTER — Other Ambulatory Visit: Payer: Self-pay | Admitting: Cardiology

## 2019-03-27 DIAGNOSIS — Z23 Encounter for immunization: Secondary | ICD-10-CM | POA: Diagnosis not present

## 2019-04-22 DIAGNOSIS — E119 Type 2 diabetes mellitus without complications: Secondary | ICD-10-CM | POA: Diagnosis not present

## 2019-04-22 DIAGNOSIS — H04123 Dry eye syndrome of bilateral lacrimal glands: Secondary | ICD-10-CM | POA: Diagnosis not present

## 2019-04-22 DIAGNOSIS — H40013 Open angle with borderline findings, low risk, bilateral: Secondary | ICD-10-CM | POA: Diagnosis not present

## 2019-04-22 DIAGNOSIS — Z961 Presence of intraocular lens: Secondary | ICD-10-CM | POA: Diagnosis not present

## 2019-05-17 DIAGNOSIS — Z794 Long term (current) use of insulin: Secondary | ICD-10-CM | POA: Diagnosis not present

## 2019-05-17 DIAGNOSIS — Z1389 Encounter for screening for other disorder: Secondary | ICD-10-CM | POA: Diagnosis not present

## 2019-05-17 DIAGNOSIS — E1169 Type 2 diabetes mellitus with other specified complication: Secondary | ICD-10-CM | POA: Diagnosis not present

## 2019-05-17 DIAGNOSIS — E7800 Pure hypercholesterolemia, unspecified: Secondary | ICD-10-CM | POA: Diagnosis not present

## 2019-05-17 DIAGNOSIS — Z0000 Encounter for general adult medical examination without abnormal findings: Secondary | ICD-10-CM | POA: Diagnosis not present

## 2019-05-17 DIAGNOSIS — I10 Essential (primary) hypertension: Secondary | ICD-10-CM | POA: Diagnosis not present

## 2019-06-15 ENCOUNTER — Other Ambulatory Visit: Payer: Self-pay | Admitting: Cardiology

## 2019-06-18 ENCOUNTER — Other Ambulatory Visit: Payer: Self-pay | Admitting: Cardiology

## 2019-08-03 ENCOUNTER — Ambulatory Visit: Payer: Medicare Other | Attending: Internal Medicine

## 2019-08-03 DIAGNOSIS — Z23 Encounter for immunization: Secondary | ICD-10-CM | POA: Insufficient documentation

## 2019-08-03 NOTE — Progress Notes (Signed)
   Covid-19 Vaccination Clinic  Name:  Mark Elliott    MRN: [id number] DOB: 01/06/1936  08/03/2019  Mark Elliott was observed post Covid-19 immunization for 15 minutes without incidence. She was provided with Vaccine Information Sheet and instruction to access the V-Safe system.   Mark Elliott was instructed to call 911 with any severe reactions post vaccine: Marland Kitchen Difficulty breathing  . Swelling of your face and throat  . A fast heartbeat  . A bad rash all over your body  . Dizziness and weakness    Immunizations Administered    Name Date Dose VIS Date Route   Pfizer COVID-19 Vaccine 08/03/2019  1:19 PM 0.3 mL 06/21/2019 Intramuscular   Manufacturer: [place]   Lot: [id number]   [place]: [id number]

## 2019-08-19 DIAGNOSIS — E1169 Type 2 diabetes mellitus with other specified complication: Secondary | ICD-10-CM | POA: Diagnosis not present

## 2019-08-24 ENCOUNTER — Ambulatory Visit: Payer: Medicare Other | Attending: Internal Medicine

## 2019-08-24 DIAGNOSIS — Z23 Encounter for immunization: Secondary | ICD-10-CM

## 2019-08-24 NOTE — Progress Notes (Signed)
   Covid-19 Vaccination Clinic  Name:  Mark Elliott    MRN: [id number] DOB: [birth year]  08/24/2019  Mark Elliott was observed post Covid-19 immunization for 15 minutes without incidence. He was provided with Vaccine Information Sheet and instruction to access the V-Safe system.   Mark Elliott was instructed to call 911 with any severe reactions post vaccine: Marland Kitchen Difficulty breathing  . Swelling of your face and throat  . A fast heartbeat  . A bad rash all over your body  . Dizziness and weakness    Immunizations Administered    Name Date Dose VIS Date Route   Pfizer COVID-19 Vaccine 08/24/2019 11:05 AM 0.3 mL 06/21/2019 Intramuscular   Manufacturer: [place]   Lot: [id number]   [place]: [id number]

## 2019-08-26 DIAGNOSIS — I10 Essential (primary) hypertension: Secondary | ICD-10-CM | POA: Diagnosis not present

## 2019-08-26 DIAGNOSIS — E119 Type 2 diabetes mellitus without complications: Secondary | ICD-10-CM | POA: Diagnosis not present

## 2019-08-26 DIAGNOSIS — E7800 Pure hypercholesterolemia, unspecified: Secondary | ICD-10-CM | POA: Diagnosis not present

## 2019-09-20 ENCOUNTER — Encounter: Payer: Self-pay | Admitting: Cardiology

## 2019-09-20 ENCOUNTER — Ambulatory Visit: Payer: Medicare Other | Admitting: Cardiology

## 2019-09-20 ENCOUNTER — Other Ambulatory Visit: Payer: Self-pay

## 2019-09-20 VITALS — BP 138/63 | HR 61 | Temp 98.0°F | Ht 66.0 in | Wt 215.0 lb

## 2019-09-20 DIAGNOSIS — I2510 Atherosclerotic heart disease of native coronary artery without angina pectoris: Secondary | ICD-10-CM

## 2019-09-20 DIAGNOSIS — I10 Essential (primary) hypertension: Secondary | ICD-10-CM | POA: Diagnosis not present

## 2019-09-20 DIAGNOSIS — I6523 Occlusion and stenosis of bilateral carotid arteries: Secondary | ICD-10-CM | POA: Diagnosis not present

## 2019-09-20 DIAGNOSIS — E7800 Pure hypercholesterolemia, unspecified: Secondary | ICD-10-CM | POA: Diagnosis not present

## 2019-09-20 MED ORDER — NITROGLYCERIN 0.4 MG SL SUBL: 0.4000 mg | SUBLINGUAL_TABLET | SUBLINGUAL | 3 refills | Status: DC | PRN

## 2019-09-20 NOTE — Progress Notes (Signed)
Labs 08/19/2019: A1c 8.7%.  TSH normal. Serum glucose 55 mg, BUN 18, creatinine 1.0, EGFR >60 mL, potassium 4.0.  CMP otherwise normal. Total cholesterol 140, triglycerides 51, HDL 55, LDL 74.  Non-HDL cholesterol 85. 

## 2019-09-20 NOTE — Progress Notes (Signed)
Primary Physician/Referring:  Seward Carol, MD  [name], male    DOB: 10-07-1935, 84 y.o.   MRN: [id number]  Chief Complaint  Patient presents with  . Coronary Artery Disease  . Carotid  . Follow-up    6 month  . Results   HPI:    Mark Elliott  is a 84 y.o. African-American male with history of hypertension, hyperlipidemia, diabetes mellitus, remote history of prostate cancer, obesity who presents for follow-up of angina pectoris and carotid disease. Coronary angiography on 11/15/2016 had revealed no high-grade stenosis but moderate disease in multiple vessels.  Patient is presently doing well, does notice dizziness on occasion with sudden position changes.   He has noticed mild dyspnea on exertion but states that this is overall stable and as he has not been exercising regularly since COVID-19, feels he is deconditioned.  He has used 1 sublingual nitroglycerin in the past 6 months.  Past Medical History:  Diagnosis Date  . Cancer ([place])   . Diabetes mellitus without complication ([place])   . Hyperlipidemia    Past Surgical History:  Procedure Laterality Date  . ANTERIOR FUSION CERVICAL SPINE    . HERNIA REPAIR    . LEFT HEART CATH AND CORONARY ANGIOGRAPHY N/A 11/15/2016   Procedure: Left Heart Cath and Coronary Angiography;  Surgeon: [name], MD;  Location: [facility];  Service: Cardiovascular;  Laterality: N/A;   Family History  Problem Relation Age of Onset  . Cancer Father        liver  . Cancer Brother        pancreatic    Social History   Tobacco Use  . Smoking status: Former Smoker    Quit date: 02/13/1974    Years since quitting: 45.6  . Smokeless tobacco: Never Used  Substance Use Topics  . Alcohol use: No   ROS  Review of Systems  Cardiovascular: Positive for chest pain and dyspnea on exertion (chronic). Negative for leg swelling.  Gastrointestinal: Negative for melena.   Objective  Blood pressure 138/63, pulse 61,  temperature 98 F (36.7 C), height '5\' 6"'  (1.676 m), weight 215 lb (97.5 kg).  Vitals with BMI 09/20/2019 07/16/2018 07/16/2018  Height '5\' 6"'  - -  Weight 215 lbs - -  BMI 00.37 - -  Systolic 048 889 169  Diastolic 63 52 46  Pulse 61 52 53     Physical Exam  Cardiovascular: Normal rate, regular rhythm and normal heart sounds. Exam reveals no gallop.  No murmur heard. Pulses:      Carotid pulses are 2+ on the right side and 2+ on the left side with bruit.      Radial pulses are 2+ on the right side and 2+ on the left side.       Dorsalis pedis pulses are 1+ on the right side and 1+ on the left side.       Posterior tibial pulses are 1+ on the right side and 1+ on the left side.  No leg edema, no JVD.  Pulmonary/Chest: Effort normal and breath sounds normal.  Abdominal: Soft. Bowel sounds are normal.   Laboratory examination:   No results for input(s): NA, K, CL, CO2, GLUCOSE, BUN, CREATININE, CALCIUM, GFRNONAA, GFRAA in the last 8760 hours. CrCl cannot be calculated (Patient's most recent lab result is older than the maximum 21 days allowed.).  CMP Latest Ref Rng & Units 11/15/2016 06/14/2007 06/11/2007  Glucose 65 - 99 mg/dL 337(H)  230(H) 141(H)  BUN 6 - 20 mg/dL '14 11 15  ' Creatinine 0.61 - 1.24 mg/dL 1.07 1.10 1.13  Sodium 135 - 145 mmol/L 134(L) 135 135  Potassium 3.5 - 5.1 mmol/L 4.6 4.2 4.0  Chloride 101 - 111 mmol/L 102 105 101  CO2 22 - 32 mmol/L '27 25 28  ' Calcium 8.9 - 10.3 mg/dL 9.2 8.7 9.2   CBC Latest Ref Rng & Units 11/15/2016 11/15/2016 06/14/2007  WBC 4.0 - 10.5 K/uL 5.0 4.2 6.2  Hemoglobin 13.0 - 17.0 g/dL 13.5 12.9(L) 12.1(L)  Hematocrit 39.0 - 52.0 % 40.7 38.8(L) 34.9(L)  Platelets 150 - 400 K/uL 242 245 219   Lipid Panel     Component Value Date/Time   CHOL 131 11/15/2016 1348   TRIG 50 11/15/2016 1348   HDL 59 11/15/2016 1348   CHOLHDL 2.2 11/15/2016 1348   VLDL 10 11/15/2016 1348   LDLCALC 62 11/15/2016 1348   HEMOGLOBIN A1C Lab Results  Component Value Date     HGBA1C 8.7 (H) 11/15/2016   MPG 203 11/15/2016   TSH No results for input(s): TSH in the last 8760 hours.  External labs:   Labs 08/19/2019: A1c 8.7%.  TSH normal. Serum glucose 55 mg, BUN 18, creatinine 1.0, EGFR >60 mL, potassium 4.0.  CMP otherwise normal. Total cholesterol 140, triglycerides 51, HDL 55, LDL 74.  Non-HDL cholesterol 85.  03/08/2018: TSH 2.26.  Hemoglobin 13.0, CBC otherwise normal.  Hemoglobin A1c 8.5%.  Potassium 4.4, glucose 123, creatinine 1.09, EGFR 65/79, CMP otherwise normal.  Cholesterol 138, triglycerides 43, HDL 56, LDL 73.  Medications and allergies  No Known Allergies   Current Outpatient Medications  Medication Instructions  . ACCU-CHEK AVIVA PLUS test strip 1 application, Injection, 2 times daily  . amLODipine (NORVASC) 5 MG tablet TAKE ONE TABLET BY MOUTH DAILY  . aspirin 81 mg, Oral, Every evening  . isosorbide mononitrate (IMDUR) 60 MG 24 hr tablet TAKE ONE TABLET BY MOUTH DAILY  . Lantus SoloStar 14 Units, Injection, Daily  . meloxicam (MOBIC) 7.5 mg, Oral, Daily  . metFORMIN (GLUCOPHAGE-XR) 1,000 mg, Oral, 2 times daily  . methocarbamol (ROBAXIN) 500 mg, Oral, Every 8 hours PRN  . metoprolol succinate (TOPROL-XL) 50 MG 24 hr tablet TAKE ONE TABLET BY MOUTH DAILY  . nitroGLYCERIN (NITROSTAT) 0.4 mg, Sublingual, Every 5 min PRN  . olmesartan (BENICAR) 10 mg, Oral, Daily  . Ozempic (1 MG/DOSE) 1 mg, Subcutaneous, Weekly  . pravastatin (PRAVACHOL) 80 mg, Oral, Every evening   Radiology:   No results found.  Cardiac Studies:   Echocardiogram 09/13/2017: Left ventricle cavity is normal in size. Mild concentric hypertrophy of the left ventricle. Normal global wall motion. Doppler evidence of grade I (impaired) diastolic dysfunction, normal LAP. Calculated EF 68%. Mild tricuspid regurgitation. Estimated pulmonary artery systolic pressure 24 mmHg.  Coronary angiogram 11/15/2016: Diffuse coronary calcification involving all the coronary  arteries. Very short left main with mild calcification.  D1 ostial 60-70% stenosis.  Mid ramus intermediate 60% stenosis. Otherwise moderate scattered disease other vessels. Normal LVEF, EF estimated at 60%.  Carotid artery duplex 09/28/2018  There is moderate amount of homogeneous plaque noted throughout the  bilateral common carotid arteries with no hemodynamically significant  stenosis.  Mild stenosis in the left external carotid artery (<50%).  Antegrade right vertebral artery flow. Antegrade left vertebral artery  flow.  Follow up in one year is appropriate if clinically indicated. Compared to  03/25/2018, left ICA stenosis of 50-69% is not present.  EKG  EKG 09/20/2019: Normal sinus rhythm with rate of 57 bpm, normal axis, right bundle branch block.  No evidence of ischemia.   EKG 03/30/2018: Normal sinus rhythm at rate of 63 bpm, left axis deviation, left anterior fascicular block.  Right bundle branch block.  No evidence of ischemia.   Assessment     ICD-10-CM   1. Coronary artery disease involving native coronary artery of native heart without angina pectoris  I25.10 EKG 12-Lead    nitroGLYCERIN (NITROSTAT) 0.4 MG SL tablet  2. Atherosclerosis of both carotid arteries  I65.23 PCV CAROTID DUPLEX (BILATERAL)  3. Primary hypertension  I10   4. Hypercholesteremia  E78.00      Meds ordered this encounter  Medications  . nitroGLYCERIN (NITROSTAT) 0.4 MG SL tablet    Sig: Place 1 tablet (0.4 mg total) under the tongue every 5 (five) minutes as needed for chest pain.    Dispense:  25 tablet    Refill:  3    Requesting 1 year supply    Medications Discontinued During This Encounter  Medication Reason  . nitroGLYCERIN (NITROSTAT) 0.4 MG SL tablet Reorder  . olmesartan (BENICAR) 20 MG tablet Duplicate  . oxyCODONE-acetaminophen (PERCOCET) 5-325 MG tablet Patient has not taken in last 30 days    Recommendations:   Mark Elliott  is a  84 y.o. African-American male with  history of hypertension, hyperlipidemia, diabetes mellitus, remote history of prostate cancer, obesity who presents for follow-up of angina pectoris and carotid disease. Coronary angiography on 11/15/2016 had revealed no high-grade stenosis but moderate disease in multiple vessels.  Patient is presently doing well, does notice dizziness on occasion with sudden position changes.   This is his annual visit, he is presently doing well and has had one episode of angina for which he has taken sublingual nitroglycerin.  I refilled his prescriptions.  He also has chronic dyspnea which is remained stable.  No PND or orthopnea.   Blood pressures well controlled, I reviewed his labs, except for uncontrolled diabetes, labs are stable and lipids are very well controlled.  His carotid artery duplex reviewed diffuse atherosclerosis but no high-grade stenosis.  Compared to previous, left ICA 50 to 69% stenosis was not evident.  However he does have a very prominent left carotid bruit, would like to confirm that he does not have significant disease and hence I will repeat this in 1 year.  Otherwise appropriate medical therapy, blood pressure is well controlled, I will see him back in 1 year or sooner if problems with repeat carotid artery duplex.  [name], MD, [facility] 09/20/2019, 3:47 PM [facility] [place] Office: [phone]

## 2019-09-27 DIAGNOSIS — E7800 Pure hypercholesterolemia, unspecified: Secondary | ICD-10-CM | POA: Diagnosis not present

## 2019-09-27 DIAGNOSIS — E119 Type 2 diabetes mellitus without complications: Secondary | ICD-10-CM | POA: Diagnosis not present

## 2019-09-27 DIAGNOSIS — I10 Essential (primary) hypertension: Secondary | ICD-10-CM | POA: Diagnosis not present

## 2019-10-21 ENCOUNTER — Telehealth: Payer: Self-pay | Admitting: Cardiology

## 2019-10-21 DIAGNOSIS — I6522 Occlusion and stenosis of left carotid artery: Secondary | ICD-10-CM

## 2019-10-21 DIAGNOSIS — I6523 Occlusion and stenosis of bilateral carotid arteries: Secondary | ICD-10-CM

## 2019-10-21 NOTE — Telephone Encounter (Signed)
Patient previously has had asymptomatic carotid stenosis on the left which is high-grade, a year ago carotid duplex revealed diffuse plaque without high-grade stenosis, but clinically does have very prominent bruit, hence would like to confirm that indeed he does not have high-grade stenosis, will repeat carotid artery duplex now as it has been a year since last study.  Carotid atherosclerosis, bilateral - Plan: PCV CAROTID DUPLEX (BILATERAL)  Asymptomatic stenosis of left carotid artery - Plan: PCV CAROTID DUPLEX (BILATERAL)

## 2019-10-25 ENCOUNTER — Other Ambulatory Visit: Payer: Self-pay

## 2019-10-25 ENCOUNTER — Ambulatory Visit: Payer: Medicare Other

## 2019-10-25 DIAGNOSIS — I6523 Occlusion and stenosis of bilateral carotid arteries: Secondary | ICD-10-CM | POA: Diagnosis not present

## 2019-10-25 DIAGNOSIS — I6522 Occlusion and stenosis of left carotid artery: Secondary | ICD-10-CM | POA: Diagnosis not present

## 2019-11-08 DIAGNOSIS — E7800 Pure hypercholesterolemia, unspecified: Secondary | ICD-10-CM | POA: Diagnosis not present

## 2019-11-08 DIAGNOSIS — I10 Essential (primary) hypertension: Secondary | ICD-10-CM | POA: Diagnosis not present

## 2019-11-08 DIAGNOSIS — E119 Type 2 diabetes mellitus without complications: Secondary | ICD-10-CM | POA: Diagnosis not present

## 2019-11-14 DIAGNOSIS — I10 Essential (primary) hypertension: Secondary | ICD-10-CM | POA: Diagnosis not present

## 2019-11-14 DIAGNOSIS — E7800 Pure hypercholesterolemia, unspecified: Secondary | ICD-10-CM | POA: Diagnosis not present

## 2019-11-14 DIAGNOSIS — E1169 Type 2 diabetes mellitus with other specified complication: Secondary | ICD-10-CM | POA: Diagnosis not present

## 2019-11-25 ENCOUNTER — Other Ambulatory Visit: Payer: Self-pay | Admitting: Cardiology

## 2019-11-26 ENCOUNTER — Other Ambulatory Visit: Payer: Self-pay | Admitting: Cardiology

## 2019-12-04 DIAGNOSIS — M545 Low back pain: Secondary | ICD-10-CM | POA: Diagnosis not present

## 2019-12-04 DIAGNOSIS — M25552 Pain in left hip: Secondary | ICD-10-CM | POA: Diagnosis not present

## 2019-12-11 DIAGNOSIS — M545 Low back pain: Secondary | ICD-10-CM | POA: Diagnosis not present

## 2020-01-08 DIAGNOSIS — M545 Low back pain: Secondary | ICD-10-CM | POA: Diagnosis not present

## 2020-02-06 DIAGNOSIS — E119 Type 2 diabetes mellitus without complications: Secondary | ICD-10-CM | POA: Diagnosis not present

## 2020-02-06 DIAGNOSIS — E1169 Type 2 diabetes mellitus with other specified complication: Secondary | ICD-10-CM | POA: Diagnosis not present

## 2020-02-06 DIAGNOSIS — I10 Essential (primary) hypertension: Secondary | ICD-10-CM | POA: Diagnosis not present

## 2020-02-06 DIAGNOSIS — E7800 Pure hypercholesterolemia, unspecified: Secondary | ICD-10-CM | POA: Diagnosis not present

## 2020-02-24 ENCOUNTER — Other Ambulatory Visit: Payer: Self-pay | Admitting: Cardiology

## 2020-03-24 DIAGNOSIS — E7800 Pure hypercholesterolemia, unspecified: Secondary | ICD-10-CM | POA: Diagnosis not present

## 2020-03-24 DIAGNOSIS — I10 Essential (primary) hypertension: Secondary | ICD-10-CM | POA: Diagnosis not present

## 2020-03-24 DIAGNOSIS — E1169 Type 2 diabetes mellitus with other specified complication: Secondary | ICD-10-CM | POA: Diagnosis not present

## 2020-04-08 DIAGNOSIS — E119 Type 2 diabetes mellitus without complications: Secondary | ICD-10-CM | POA: Diagnosis not present

## 2020-04-08 DIAGNOSIS — E7800 Pure hypercholesterolemia, unspecified: Secondary | ICD-10-CM | POA: Diagnosis not present

## 2020-04-08 DIAGNOSIS — I10 Essential (primary) hypertension: Secondary | ICD-10-CM | POA: Diagnosis not present

## 2020-04-08 DIAGNOSIS — E1169 Type 2 diabetes mellitus with other specified complication: Secondary | ICD-10-CM | POA: Diagnosis not present

## 2020-05-05 ENCOUNTER — Ambulatory Visit: Payer: Medicare Other | Attending: Internal Medicine

## 2020-05-05 DIAGNOSIS — Z23 Encounter for immunization: Secondary | ICD-10-CM

## 2020-05-05 NOTE — Progress Notes (Signed)
   Covid-19 Vaccination Clinic  Name:  CORLEONE BIEGLER    MRN: [id number] DOB: January 16, 1936  05/05/2020  Mr. Harnish was observed post Covid-19 immunization for 15 minutes without incident. He was provided with Vaccine Information Sheet and instruction to access the V-Safe system.   Mr. Kilner was instructed to call [phone] with any severe reactions post vaccine: Marland Kitchen Difficulty breathing  . Swelling of face and throat  . A fast heartbeat  . A bad rash all over body  . Dizziness and weakness

## 2020-05-28 DIAGNOSIS — I25118 Atherosclerotic heart disease of native coronary artery with other forms of angina pectoris: Secondary | ICD-10-CM | POA: Diagnosis not present

## 2020-05-28 DIAGNOSIS — E1169 Type 2 diabetes mellitus with other specified complication: Secondary | ICD-10-CM | POA: Diagnosis not present

## 2020-05-28 DIAGNOSIS — Z23 Encounter for immunization: Secondary | ICD-10-CM | POA: Diagnosis not present

## 2020-05-28 DIAGNOSIS — Z1389 Encounter for screening for other disorder: Secondary | ICD-10-CM | POA: Diagnosis not present

## 2020-05-28 DIAGNOSIS — R110 Nausea: Secondary | ICD-10-CM | POA: Diagnosis not present

## 2020-05-28 DIAGNOSIS — E7800 Pure hypercholesterolemia, unspecified: Secondary | ICD-10-CM | POA: Diagnosis not present

## 2020-05-28 DIAGNOSIS — Z0000 Encounter for general adult medical examination without abnormal findings: Secondary | ICD-10-CM | POA: Diagnosis not present

## 2020-05-28 DIAGNOSIS — I10 Essential (primary) hypertension: Secondary | ICD-10-CM | POA: Diagnosis not present

## 2020-05-29 ENCOUNTER — Other Ambulatory Visit: Payer: Self-pay | Admitting: Cardiology

## 2020-06-08 ENCOUNTER — Telehealth: Payer: Self-pay

## 2020-06-08 NOTE — Telephone Encounter (Signed)
Patient wife called and stated that patient has been using Nitroglycerin more often now. When he is exerting himself while walking, pulls the trash bin to the road, bringing in groceries, etc. He beings to have chest pain or is extremely fatigued, or short of breath and by the end of the day, he is completely worn out. It was advised by patient's PCP to give [place] a call about it. Do you want patient to come in to be seen? Please advise.     Wife number:  [phone]

## 2020-06-08 NOTE — Telephone Encounter (Signed)
Sure we can see either me or [employer]

## 2020-06-10 NOTE — Telephone Encounter (Signed)
Called wife back. She will go ahead and schedule appointment with [facility]. Transferred call to front desk staff.

## 2020-06-16 DIAGNOSIS — R748 Abnormal levels of other serum enzymes: Secondary | ICD-10-CM | POA: Diagnosis not present

## 2020-06-16 NOTE — Progress Notes (Signed)
Primary Physician/Referring:  Seward Carol, MD  [name], male    DOB: 06/07/1936, 84 y.o.   MRN: [id number]  Chief Complaint  Patient presents with  . Chest Pain  . Follow-up   HPI:    Mark Elliott  is a 84 y.o. African-American male with history of hypertension, hyperlipidemia, diabetes mellitus, remote history of prostate cancer, obesity who presents for follow-up of angina pectoris and carotid disease. Coronary angiography on 11/15/2016 had revealed no high-grade stenosis but moderate disease in multiple vessels.   Patient presents for urgent visit with complaints of chest pain, fatigue, and more frequent use of sublingual nitroglycerin.  Patient was last seen in our office by Dr. Einar Gip 09/2019.  Since then he states over the last 6 months he has had frequent exertional chest pain associated with dyspnea.  Patient states this occurs anytime he exerts himself including walking the garbage out to the curb or carrying groceries into the house.  For the last 1 month he has been walking on the treadmill at the gym for about 30 minutes without incline, and without issue.  He monitors his blood pressure at home with readings averaging 130s/70s.  He denies other associated symptoms with chest pain.  He has no pain at rest.  Patient's chest pain improved with sublingual nitroglycerin, which he is taking several days a week presently. Denies orthopnea, PND, leg swelling.   Past Medical History:  Diagnosis Date  . Cancer ([place])   . Diabetes mellitus without complication ([place])   . Hyperlipidemia    Past Surgical History:  Procedure Laterality Date  . ANTERIOR FUSION CERVICAL SPINE    . HERNIA REPAIR    . LEFT HEART CATH AND CORONARY ANGIOGRAPHY N/A 11/15/2016   Procedure: Left Heart Cath and Coronary Angiography;  Surgeon: [name], MD;  Location: [place] [facility];  Service: Cardiovascular;  Laterality: N/A;   Family History  Problem Relation Age of Onset  .  Cancer Father        liver  . Cancer Brother        pancreatic    Social History   Tobacco Use  . Smoking status: Former Smoker    Quit date: 02/13/1974    Years since quitting: 46.3  . Smokeless tobacco: Never Used  Substance Use Topics  . Alcohol use: No  Marital Status: Married   ROS  Review of Systems  Constitutional: Positive for malaise/fatigue. Negative for weight gain.  Cardiovascular: Positive for chest pain and dyspnea on exertion (chronic). Negative for claudication, leg swelling, near-syncope, orthopnea, palpitations, paroxysmal nocturnal dyspnea and syncope.  Respiratory: Negative for shortness of breath.   Hematologic/Lymphatic: Does not bruise/bleed easily.  Gastrointestinal: Negative for melena.  Neurological: Negative for dizziness and weakness.   Objective  Blood pressure (!) 138/54, pulse (!) 57, resp. rate 16, height '5\' 6"'  (1.676 m), weight 217 lb (98.4 kg), SpO2 95 %.  Vitals with BMI 06/17/2020 09/20/2019 07/16/2018  Height '5\' 6"'  '5\' 6"'  -  Weight 217 lbs 215 lbs -  BMI 27.03 50.09 -  Systolic 381 829 937  Diastolic 54 63 52  Pulse 57 61 52     Physical Exam Vitals reviewed.  HENT:     Head: Normocephalic and atraumatic.  Cardiovascular:     Rate and Rhythm: Normal rate and regular rhythm.     Pulses:          Carotid pulses are 2+ on the right side and 2+ on  the left side with bruit.      Radial pulses are 2+ on the right side and 2+ on the left side.       Dorsalis pedis pulses are 1+ on the right side and 1+ on the left side.       Posterior tibial pulses are 1+ on the right side and 1+ on the left side.     Heart sounds: Normal heart sounds, S1 normal and S2 normal. No murmur heard.  No gallop.      Comments: No leg edema, no JVD. Pulmonary:     Effort: Pulmonary effort is normal. No respiratory distress.     Breath sounds: Normal breath sounds. No wheezing, rhonchi or rales.  Abdominal:     General: Bowel sounds are normal.     Palpations:  Abdomen is soft.  Musculoskeletal:     Right lower leg: No edema.     Left lower leg: No edema.  Neurological:     Mental Status: He is alert.    Laboratory examination:   No results for input(s): NA, K, CL, CO2, GLUCOSE, BUN, CREATININE, CALCIUM, GFRNONAA, GFRAA in the last 8760 hours. CrCl cannot be calculated (Patient's most recent lab result is older than the maximum 21 days allowed.).  CMP Latest Ref Rng & Units 11/15/2016 06/14/2007 06/11/2007  Glucose 65 - 99 mg/dL 337(H) 230(H) 141(H)  BUN 6 - 20 mg/dL '14 11 15  ' Creatinine 0.61 - 1.24 mg/dL 1.07 1.10 1.13  Sodium 135 - 145 mmol/L 134(L) 135 135  Potassium 3.5 - 5.1 mmol/L 4.6 4.2 4.0  Chloride 101 - 111 mmol/L 102 105 101  CO2 22 - 32 mmol/L '27 25 28  ' Calcium 8.9 - 10.3 mg/dL 9.2 8.7 9.2   CBC Latest Ref Rng & Units 11/15/2016 11/15/2016 06/14/2007  WBC 4.0 - 10.5 K/uL 5.0 4.2 6.2  Hemoglobin 13.0 - 17.0 g/dL 13.5 12.9(L) 12.1(L)  Hematocrit 39 - 52 % 40.7 38.8(L) 34.9(L)  Platelets 150 - 400 K/uL 242 245 219   Lipid Panel     Component Value Date/Time   CHOL 131 11/15/2016 1348   TRIG 50 11/15/2016 1348   HDL 59 11/15/2016 1348   CHOLHDL 2.2 11/15/2016 1348   VLDL 10 11/15/2016 1348   LDLCALC 62 11/15/2016 1348   HEMOGLOBIN A1C Lab Results  Component Value Date   HGBA1C 8.7 (H) 11/15/2016   MPG 203 11/15/2016   TSH No results for input(s): TSH in the last 8760 hours.  External labs:  05/28/2020: HDL 53, LDL 71, total cholesterol 138, triglycerides 64 A1c 8.0% BUN 19, creatinine 1.22, EGFR 60 TSH 1.93  Labs 08/19/2019: A1c 8.7%.  TSH normal. Serum glucose 55 mg, BUN 18, creatinine 1.0, EGFR >60 mL, potassium 4.0.  CMP otherwise normal. Total cholesterol 140, triglycerides 51, HDL 55, LDL 74.  Non-HDL cholesterol 85.  03/08/2018: TSH 2.26.  Hemoglobin 13.0, CBC otherwise normal.  Hemoglobin A1c 8.5%.  Potassium 4.4, glucose 123, creatinine 1.09, EGFR 65/79, CMP otherwise normal.  Cholesterol 138, triglycerides  43, HDL 56, LDL 73.  Medications and allergies  No Known Allergies   Current Outpatient Medications  Medication Instructions  . ACCU-CHEK AVIVA PLUS test strip 1 application, Injection, 2 times daily  . amLODipine (NORVASC) 5 MG tablet TAKE ONE TABLET BY MOUTH DAILY  . aspirin 81 mg, Oral, Every evening  . isosorbide mononitrate (IMDUR) 60 MG 24 hr tablet TAKE ONE TABLET BY MOUTH DAILY  . Lantus SoloStar 14 Units, Injection, Daily  .  meloxicam (MOBIC) 7.5 mg, Oral, Daily  . metFORMIN (GLUCOPHAGE-XR) 1,000 mg, Oral, 2 times daily  . methocarbamol (ROBAXIN) 500 mg, Oral, Every 8 hours PRN  . metoprolol succinate (TOPROL-XL) 50 MG 24 hr tablet TAKE ONE TABLET BY MOUTH DAILY  . nitroGLYCERIN (NITROSTAT) 0.4 mg, Sublingual, Every 5 min PRN  . olmesartan (BENICAR) 20 MG tablet TAKE 1/2 TABLET BY MOUTH DAILY  . pravastatin (PRAVACHOL) 80 mg, Oral, Every evening  . ranolazine (RANEXA) 500 mg, Oral, 2 times daily   Radiology:   No results found.  Cardiac Studies:  Carotid artery duplex 10/25/2019:  Diffuse homogeneous plaque in bilateral common carotid arteries with <50%  stenosis.  Bilateral external carotid stenosis <50%.  Antegrade bilateral vertebral flow.  Compared to 03/21/2018, left ICA stenosis of 50-69% not present.  Follow up studies when clinically indicated.  See enclosed images.  Echocardiogram 09/13/2017: Left ventricle cavity is normal in size. Mild concentric hypertrophy of the left ventricle. Normal global wall motion. Doppler evidence of grade I (impaired) diastolic dysfunction, normal LAP. Calculated EF 68%. Mild tricuspid regurgitation. Estimated pulmonary artery systolic pressure 24 mmHg.  Coronary angiogram 11/15/2016: Diffuse coronary calcification involving all the coronary arteries. Very short left main with mild calcification.  D1 ostial 60-70% stenosis.  Mid ramus intermediate 60% stenosis. Otherwise moderate scattered disease other vessels. Normal LVEF, EF  estimated at 60%.  Carotid artery duplex 09/28/2018  There is moderate amount of homogeneous plaque noted throughout the bilateral common carotid arteries with no hemodynamically significant stenosis.  Mild stenosis in the left external carotid artery (<50%).  Antegrade right vertebral artery flow. Antegrade left vertebral artery flow.  Follow up in one year is appropriate if clinically indicated. Compared to 03/25/2018, left ICA stenosis of 50-69% is not present.  EKG   EKG 06/17/2020: Sinus rhythm at a rate of 61 bpm.  Normal axis.  Right bundle branch block. No evidence of ischemia. Compared to 09/20/2019, no significant change.   EKG 09/20/2019: Normal sinus rhythm with rate of 57 bpm, normal axis, right bundle branch block.  No evidence of ischemia.   EKG 03/30/2018: Normal sinus rhythm at rate of 63 bpm, left axis deviation, left anterior fascicular block.  Right bundle branch block.  No evidence of ischemia.   Assessment     ICD-10-CM   1. Angina pectoris (HCC)  I20.9 EKG 12-Lead    PCV MYOCARDIAL PERFUSION WO LEXISCAN    ranolazine (RANEXA) 500 MG 12 hr tablet    PCV ECHOCARDIOGRAM COMPLETE  2. Dyspnea on exertion  R06.00 PCV MYOCARDIAL PERFUSION WO LEXISCAN    PCV ECHOCARDIOGRAM COMPLETE  3. Coronary artery disease involving native coronary artery of native heart without angina pectoris  I25.10 PCV MYOCARDIAL PERFUSION WO LEXISCAN    ranolazine (RANEXA) 500 MG 12 hr tablet    PCV ECHOCARDIOGRAM COMPLETE  4. Carotid atherosclerosis, bilateral  I65.23   5. Primary hypertension  I10      Meds ordered this encounter  Medications  . ranolazine (RANEXA) 500 MG 12 hr tablet    Sig: Take 1 tablet (500 mg total) by mouth 2 (two) times daily.    Dispense:  60 tablet    Refill:  3    Medications Discontinued During This Encounter  Medication Reason  . OZEMPIC, 1 MG/DOSE, 2 MG/1.5ML SOPN Patient has not taken in last 30 days    Recommendations:   Mark Elliott  is a  84  y.o. African-American male with history of hypertension, hyperlipidemia, diabetes mellitus,  remote history of prostate cancer, obesity who presents for follow-up of angina pectoris and carotid disease. Coronary angiography on 11/15/2016 had revealed no high-grade stenosis but moderate disease in multiple vessels.    Patient presents for urgent visit with complaints of worsening anginal symptoms and dyspnea over the last 6 months with increased use of sublingual nitroglycerin.  Patient symptoms are classic of angina pectoris. EKG is without evidence of ischemia and is unchanged compared to previous. There are no clinical signs of heart failure. In view of known underlying coronary artery disease will obtain nuclear stress test.  We will also repeat echocardiogram at this time to further evaluate dyspnea and chest pain.  Patient is presently on multiple antianginal medications, will continue these.  We will also add Ranexa 500 mg twice daily as he is having symptoms several days per week.  In regard to hypertension his blood pressure in the office is above goal, however home blood pressure readings are well controlled. In regard to carotid stenosis, will plan to repeat carotid duplex closer to 10/2020 as his last one was done in 10/2019. I personally reviewed external records, ipids remain well controlled.   Will continue other current cardiovascular medications.   Follow up in 6 weeks for angina pectoris, hypertension, and to review cardiac test results.   Patient was seen in collaboration with Dr. Einar Gip. He also reviewed patient's chart and Dr. Einar Gip is in agreement of the plan.    Alethia Berthold, PA-C 06/17/2020, 3:40 PM Office: [phone]

## 2020-06-17 ENCOUNTER — Ambulatory Visit: Payer: Medicare Other | Admitting: Student

## 2020-06-17 ENCOUNTER — Other Ambulatory Visit: Payer: Self-pay

## 2020-06-17 ENCOUNTER — Encounter: Payer: Self-pay | Admitting: Student

## 2020-06-17 VITALS — BP 138/54 | HR 57 | Resp 16 | Ht 66.0 in | Wt 217.0 lb

## 2020-06-17 DIAGNOSIS — I2510 Atherosclerotic heart disease of native coronary artery without angina pectoris: Secondary | ICD-10-CM

## 2020-06-17 DIAGNOSIS — I10 Essential (primary) hypertension: Secondary | ICD-10-CM | POA: Diagnosis not present

## 2020-06-17 DIAGNOSIS — I209 Angina pectoris, unspecified: Secondary | ICD-10-CM

## 2020-06-17 DIAGNOSIS — R0600 Dyspnea, unspecified: Secondary | ICD-10-CM

## 2020-06-17 DIAGNOSIS — I6523 Occlusion and stenosis of bilateral carotid arteries: Secondary | ICD-10-CM

## 2020-06-17 DIAGNOSIS — R0609 Other forms of dyspnea: Secondary | ICD-10-CM | POA: Diagnosis not present

## 2020-06-17 DIAGNOSIS — R072 Precordial pain: Secondary | ICD-10-CM

## 2020-06-17 MED ORDER — RANOLAZINE ER 500 MG PO TB12: 500.0000 mg | ORAL_TABLET | Freq: Two times a day (BID) | ORAL | 3 refills | Status: DC

## 2020-06-19 ENCOUNTER — Ambulatory Visit: Payer: Medicare Other | Admitting: Student

## 2020-06-29 ENCOUNTER — Ambulatory Visit: Payer: Medicare Other

## 2020-06-29 ENCOUNTER — Other Ambulatory Visit: Payer: Self-pay

## 2020-06-29 DIAGNOSIS — I2510 Atherosclerotic heart disease of native coronary artery without angina pectoris: Secondary | ICD-10-CM

## 2020-06-29 DIAGNOSIS — R0600 Dyspnea, unspecified: Secondary | ICD-10-CM

## 2020-06-29 DIAGNOSIS — R0609 Other forms of dyspnea: Secondary | ICD-10-CM

## 2020-06-29 DIAGNOSIS — I209 Angina pectoris, unspecified: Secondary | ICD-10-CM | POA: Diagnosis not present

## 2020-07-01 NOTE — Progress Notes (Signed)
Please inform patient he does have some evidence of ischemia on his stress test. We will discuss further at upcoming visit. Please advise him he is safe to go about his regular activity, but avoid strenuous activity until follow up appointment.

## 2020-07-02 NOTE — Progress Notes (Signed)
No answer pt doesn't have a vm will try again later

## 2020-07-07 DIAGNOSIS — E119 Type 2 diabetes mellitus without complications: Secondary | ICD-10-CM | POA: Diagnosis not present

## 2020-07-07 DIAGNOSIS — I10 Essential (primary) hypertension: Secondary | ICD-10-CM | POA: Diagnosis not present

## 2020-07-07 DIAGNOSIS — E1169 Type 2 diabetes mellitus with other specified complication: Secondary | ICD-10-CM | POA: Diagnosis not present

## 2020-07-07 DIAGNOSIS — E7800 Pure hypercholesterolemia, unspecified: Secondary | ICD-10-CM | POA: Diagnosis not present

## 2020-07-07 NOTE — Progress Notes (Signed)
Spoke to patient regarding his results he is aware

## 2020-07-14 ENCOUNTER — Other Ambulatory Visit: Payer: Self-pay

## 2020-07-14 ENCOUNTER — Ambulatory Visit: Payer: Medicare Other

## 2020-07-14 DIAGNOSIS — I2510 Atherosclerotic heart disease of native coronary artery without angina pectoris: Secondary | ICD-10-CM

## 2020-07-14 DIAGNOSIS — R0609 Other forms of dyspnea: Secondary | ICD-10-CM

## 2020-07-14 DIAGNOSIS — R0600 Dyspnea, unspecified: Secondary | ICD-10-CM

## 2020-07-14 DIAGNOSIS — I209 Angina pectoris, unspecified: Secondary | ICD-10-CM

## 2020-07-16 NOTE — Progress Notes (Signed)
Patient is aware of results.

## 2020-07-16 NOTE — Progress Notes (Signed)
Please inform patient echo is unchanged from previous in 2019. Will discuss further at upcoming visit.

## 2020-07-20 ENCOUNTER — Other Ambulatory Visit: Payer: Medicare Other

## 2020-07-20 DIAGNOSIS — Z20822 Contact with and (suspected) exposure to covid-19: Secondary | ICD-10-CM

## 2020-07-20 NOTE — Addendum Note (Signed)
Addended by: Reggy Eye on: 07/20/2020 07:53 PM   Modules accepted: Orders

## 2020-07-21 DIAGNOSIS — Z20822 Contact with and (suspected) exposure to covid-19: Secondary | ICD-10-CM | POA: Diagnosis not present

## 2020-07-23 LAB — SARS-COV-2, NAA 2 DAY TAT

## 2020-07-23 LAB — NOVEL CORONAVIRUS, NAA: SARS-CoV-2, NAA: NOT DETECTED

## 2020-07-27 NOTE — Progress Notes (Signed)
Primary Physician/Referring:  Seward Carol, MD  [name], male    DOB: May 16, 1936, 85 y.o.   MRN: [id number]  Chief Complaint  Patient presents with  . Chest Pain  . Hypertension  . Results  . Follow-up    6 week   HPI:    Mark Elliott  is a 85 y.o. African-American male with history of hypertension, hyperlipidemia, diabetes mellitus, remote history of prostate cancer, obesity who presents for follow-up of angina pectoris and carotid disease. Coronary angiography on 11/15/2016 had revealed no high-grade stenosis but moderate disease in multiple vessels.   Patient presents for 6 week follow up of angina pectoris, hypertension, and results of cardiac testing. At last visit added Ranexa 500 mg twice Elliott. Patient reports since starting Ranexa his anginal symptoms have improved, however he continues to experience exertional chest pain and dyspnea regularly. Denies orthopnea, PND, leg swelling. He has not been monitoring his blood pressure at home.   Past Medical History:  Diagnosis Date  . Cancer ([place])   . Diabetes mellitus without complication ([place])   . Hyperlipidemia    Past Surgical History:  Procedure Laterality Date  . ANTERIOR FUSION CERVICAL SPINE    . HERNIA REPAIR    . LEFT HEART CATH AND CORONARY ANGIOGRAPHY N/A 11/15/2016   Procedure: Left Heart Cath and Coronary Angiography;  Surgeon: [name], MD;  Location: [facility];  Service: Cardiovascular;  Laterality: N/A;   Family History  Problem Relation Age of Onset  . Cancer Father        liver  . Cancer Brother        pancreatic    Social History   Tobacco Use  . Smoking status: Former Smoker    Quit date: 02/13/1974    Years since quitting: 46.4  . Smokeless tobacco: Never Used  Substance Use Topics  . Alcohol use: No  Marital Status: Married   ROS  Review of Systems  Constitutional: Negative for weight gain.  Cardiovascular: Positive for chest pain and dyspnea on  exertion (chronic). Negative for claudication, leg swelling, near-syncope, orthopnea, palpitations, paroxysmal nocturnal dyspnea and syncope.  Respiratory: Negative for shortness of breath.   Hematologic/Lymphatic: Does not bruise/bleed easily.  Gastrointestinal: Negative for melena.  Neurological: Negative for dizziness and weakness.   Objective  Blood pressure (!) 150/70, pulse 64, temperature (!) 97.4 F (36.3 C), resp. rate 16, height '5\' 6"'  (1.676 m), weight 223 lb (101.2 kg), SpO2 99 %.  Vitals with BMI 07/29/2020 06/17/2020 09/20/2019  Height '5\' 6"'  '5\' 6"'  '5\' 6"'   Weight 223 lbs 217 lbs 215 lbs  BMI 36.01 88.75 79.72  Systolic 820 601 561  Diastolic 70 54 63  Pulse 64 57 61     Physical Exam Vitals reviewed.  HENT:     Head: Normocephalic and atraumatic.  Cardiovascular:     Rate and Rhythm: Normal rate and regular rhythm.     Pulses:          Carotid pulses are 2+ on the right side and 2+ on the left side with bruit.      Radial pulses are 2+ on the right side and 2+ on the left side.       Dorsalis pedis pulses are 1+ on the right side and 1+ on the left side.       Posterior tibial pulses are 1+ on the right side and 1+ on the left side.     Heart sounds:  Normal heart sounds, S1 normal and S2 normal. No murmur heard. No gallop.      Comments: Trace bilateral lower leg edema. No JVD. Pulmonary:     Effort: Pulmonary effort is normal. No respiratory distress.     Breath sounds: Normal breath sounds. No wheezing, rhonchi or rales.  Abdominal:     General: Bowel sounds are normal.     Palpations: Abdomen is soft.  Musculoskeletal:     Right lower leg: No edema.     Left lower leg: No edema.  Neurological:     Mental Status: He is alert.    Laboratory examination:   No results for input(s): NA, K, CL, CO2, GLUCOSE, BUN, CREATININE, CALCIUM, GFRNONAA, GFRAA in the last 8760 hours. CrCl cannot be calculated (Patient's most recent lab result is older than the maximum 21 days  allowed.).  CMP Latest Ref Rng & Units 11/15/2016 06/14/2007 06/11/2007  Glucose 65 - 99 mg/dL 337(H) 230(H) 141(H)  BUN 6 - 20 mg/dL '14 11 15  ' Creatinine 0.61 - 1.24 mg/dL 1.07 1.10 1.13  Sodium 135 - 145 mmol/L 134(L) 135 135  Potassium 3.5 - 5.1 mmol/L 4.6 4.2 4.0  Chloride 101 - 111 mmol/L 102 105 101  CO2 22 - 32 mmol/L '27 25 28  ' Calcium 8.9 - 10.3 mg/dL 9.2 8.7 9.2   CBC Latest Ref Rng & Units 11/15/2016 11/15/2016 06/14/2007  WBC 4.0 - 10.5 K/uL 5.0 4.2 6.2  Hemoglobin 13.0 - 17.0 g/dL 13.5 12.9(L) 12.1(L)  Hematocrit 39.0 - 52.0 % 40.7 38.8(L) 34.9(L)  Platelets 150 - 400 K/uL 242 245 219   Lipid Panel     Component Value Date/Time   CHOL 131 11/15/2016 1348   TRIG 50 11/15/2016 1348   HDL 59 11/15/2016 1348   CHOLHDL 2.2 11/15/2016 1348   VLDL 10 11/15/2016 1348   LDLCALC 62 11/15/2016 1348   HEMOGLOBIN A1C Lab Results  Component Value Date   HGBA1C 8.7 (H) 11/15/2016   MPG 203 11/15/2016   TSH No results for input(s): TSH in the last 8760 hours.  External labs:  05/28/2020: HDL 53, LDL 71, total cholesterol 138, triglycerides 64 A1c 8.0% BUN 19, creatinine 1.22, EGFR 60 TSH 1.93  Labs 08/19/2019: A1c 8.7%.  TSH normal. Serum glucose 55 mg, BUN 18, creatinine 1.0, EGFR >60 mL, potassium 4.0.  CMP otherwise normal. Total cholesterol 140, triglycerides 51, HDL 55, LDL 74.  Non-HDL cholesterol 85.  03/08/2018: TSH 2.26.  Hemoglobin 13.0, CBC otherwise normal.  Hemoglobin A1c 8.5%.  Potassium 4.4, glucose 123, creatinine 1.09, EGFR 65/79, CMP otherwise normal.  Cholesterol 138, triglycerides 43, HDL 56, LDL 73.  Medications and allergies  No Known Allergies   Current Outpatient Medications  Medication Instructions  . ACCU-CHEK AVIVA PLUS test strip 1 application, Injection, 2 times Elliott  . amLODipine (NORVASC) 10 mg, Oral, Elliott  . aspirin 81 mg, Oral, Every evening  . isosorbide mononitrate (IMDUR) 60 MG 24 hr tablet TAKE ONE TABLET BY MOUTH Elliott  . Lantus  SoloStar 14 Units, Injection, Elliott  . meloxicam (MOBIC) 7.5 mg, Oral, Elliott  . metFORMIN (GLUCOPHAGE-XR) 1,000 mg, Oral, 2 times Elliott  . methocarbamol (ROBAXIN) 500 mg, Oral, Every 8 hours PRN  . metoprolol succinate (TOPROL-XL) 50 MG 24 hr tablet TAKE ONE TABLET BY MOUTH Elliott  . nitroGLYCERIN (NITROSTAT) 0.4 mg, Sublingual, Every 5 min PRN  . olmesartan (BENICAR) 20 MG tablet TAKE 1/2 TABLET BY MOUTH Elliott  . ondansetron (ZOFRAN) 4 mg, Oral,  Elliott at bedtime  . pravastatin (PRAVACHOL) 80 mg, Oral, Every evening  . ranolazine (RANEXA) 500 mg, Oral, 2 times Elliott   Radiology:   No results found.  Cardiac Studies:   PCV ECHOCARDIOGRAM COMPLETE 56/43/3295 Normal LV systolic function with visual EF 55-60%. Left ventricle cavity is normal in size. Mild left ventricular hypertrophy. Normal global wall motion. Indeterminate diastolic filling pattern, normal LAP. Mild (Grade I) mitral regurgitation. Mild tricuspid regurgitation. Compared to prior study dated 09/13/2017 no significant change.  PCV MYOCARDIAL PERFUSION WO LEXISCAN 06/29/2020 Exercise nuclear stress test was performed using Bruce protocol. Patient reached 7.2 METS, and 86% of age predicted maximum heart rate. Exercise capacity was low. No chest pain reported. Heart rate and hemodynamic response were normal. Peak stress EKG showed sinus tachycardia, RBBB, nonspecific T wave changes inferior leads. SPECT images showed large sized, severe intensity, mid to basal, predominantly reversible perfusion defect in anterolateral, inferolateral myocardium. Intermediate risk study.  Carotid artery duplex 10/25/2019:  Diffuse homogeneous plaque in bilateral common carotid arteries with <50%  stenosis.  Bilateral external carotid stenosis <50%.  Antegrade bilateral vertebral flow.  Compared to 03/21/2018, left ICA stenosis of 50-69% not present.  Follow up studies when clinically indicated.  See enclosed images.  Echocardiogram  09/13/2017: Left ventricle cavity is normal in size. Mild concentric hypertrophy of the left ventricle. Normal global wall motion. Doppler evidence of grade I (impaired) diastolic dysfunction, normal LAP. Calculated EF 68%. Mild tricuspid regurgitation. Estimated pulmonary artery systolic pressure 24 mmHg.  Coronary angiogram 11/15/2016: Diffuse coronary calcification involving all the coronary arteries. Very short left main with mild calcification.  D1 ostial 60-70% stenosis.  Mid ramus intermediate 60% stenosis. Otherwise moderate scattered disease other vessels. Normal LVEF, EF estimated at 60%.  EKG   EKG 06/17/2020: Sinus rhythm at a rate of 61 bpm.  Normal axis.  Right bundle branch block. No evidence of ischemia. Compared to 09/20/2019, no significant change.   EKG 09/20/2019: Normal sinus rhythm with rate of 57 bpm, normal axis, right bundle branch block.  No evidence of ischemia.   EKG 03/30/2018: Normal sinus rhythm at rate of 63 bpm, left axis deviation, left anterior fascicular block.  Right bundle branch block.  No evidence of ischemia.   Assessment     ICD-10-CM   1. Coronary artery disease involving native coronary artery of native heart without angina pectoris  J88.41 Basic metabolic panel    CBC    Novel Coronavirus, NAA ([facility])  2. Angina pectoris (HCC)  I20.9   3. Dyspnea on exertion  R06.00   4. Primary hypertension  I10      Meds ordered this encounter  Medications  . amLODipine (NORVASC) 10 MG tablet    Sig: Take 1 tablet (10 mg total) by mouth Elliott.    Dispense:  90 tablet    Refill:  3    Medications Discontinued During This Encounter  Medication Reason  . amLODipine (NORVASC) 5 MG tablet Change in therapy    Recommendations:   CARMIN DIBARTOLO  is a  85 y.o. African-American male with history of hypertension, hyperlipidemia, diabetes mellitus, remote history of prostate cancer, obesity who presents for follow-up of angina pectoris and carotid disease.  Coronary angiography on 11/15/2016 had revealed no high-grade stenosis but moderate disease in multiple vessels.    Patient presents for 6 week follow up of angina and dyspnea on exertion. Angina symptoms have improved however patient continues to have frequent episodes of exertional chest pain and dyspnea.  Reviewed and discussed results of echocardiogram and nuclear stress test with patient and his wife. Nuclear stress test revealed nonspecific T wave changes inferior leads with stress and large sized, severe intensity, mid to basal, predominantly reversible perfusion defect in anterolateral, inferolateral myocardium. In view of ongoing classic symptoms and stress test findings concerning for ischemia recommend cardiac catheretization. The heart catheterization procedure was explained to the patient in detail. The indication, alternatives, risks and benefits were reviewed. Complications including but not limited to bleeding, infection, acute kidney injury, blood transfusion, heart rhythm disturbances, contrast (dye) reaction, damage to the arteries or nerves in the legs or hands, cerebrovascular accident, myocardial infarction, need for emergent bypass surgery, blood clots in the legs, possible need for emergent blood transfusion, and rarely death were reviewed and discussed with the patient. The patient voices understanding and wishes to proceed.  In regard to hypertension, blood pressure is uncontrolled. Will increased amlodipine from 5 mg to 10 mg Elliott. Patient will monitor his blood pressure Elliott at home and bring a log with him to his next visit.   Follow up in 6 week for hypertension and angina following cardiac catheterization.    Alethia Berthold, PA-C 07/29/2020, 7:06 PM Office: [phone]

## 2020-07-27 NOTE — H&P (View-Only) (Signed)
Primary Physician/Referring:  Seward Carol, MD  [name], male    DOB: 11/10/1935, 85 y.o.   MRN: [id number]  Chief Complaint  Patient presents with  . Chest Pain  . Hypertension  . Results  . Follow-up    6 week   HPI:    Mark Elliott  is a 86 y.o. African-American male with history of hypertension, hyperlipidemia, diabetes mellitus, remote history of prostate cancer, obesity who presents for follow-up of angina pectoris and carotid disease. Coronary angiography on 11/15/2016 had revealed no high-grade stenosis but moderate disease in multiple vessels.   Patient presents for 6 week follow up of angina pectoris, hypertension, and results of cardiac testing. At last visit added Ranexa 500 mg twice daily. Patient reports since starting Ranexa his anginal symptoms have improved, however he continues to experience exertional chest pain and dyspnea regularly. Denies orthopnea, PND, leg swelling. He has not been monitoring his blood pressure at home.   Past Medical History:  Diagnosis Date  . Cancer ([place])   . Diabetes mellitus without complication ([place])   . Hyperlipidemia    Past Surgical History:  Procedure Laterality Date  . ANTERIOR FUSION CERVICAL SPINE    . HERNIA REPAIR    . LEFT HEART CATH AND CORONARY ANGIOGRAPHY N/A 11/15/2016   Procedure: Left Heart Cath and Coronary Angiography;  Surgeon: [name], MD;  Location: [facility];  Service: Cardiovascular;  Laterality: N/A;   Family History  Problem Relation Age of Onset  . Cancer Father        liver  . Cancer Brother        pancreatic    Social History   Tobacco Use  . Smoking status: Former Smoker    Quit date: 02/13/1974    Years since quitting: 46.4  . Smokeless tobacco: Never Used  Substance Use Topics  . Alcohol use: No  Marital Status: Married   ROS  Review of Systems  Constitutional: Negative for weight gain.  Cardiovascular: Positive for chest pain and dyspnea on  exertion (chronic). Negative for claudication, leg swelling, near-syncope, orthopnea, palpitations, paroxysmal nocturnal dyspnea and syncope.  Respiratory: Negative for shortness of breath.   Hematologic/Lymphatic: Does not bruise/bleed easily.  Gastrointestinal: Negative for melena.  Neurological: Negative for dizziness and weakness.   Objective  Blood pressure (!) 150/70, pulse 64, temperature (!) 97.4 F (36.3 C), resp. rate 16, height '5\' 6"'  (1.676 m), weight 223 lb (101.2 kg), SpO2 99 %.  Vitals with BMI 07/29/2020 06/17/2020 09/20/2019  Height '5\' 6"'  '5\' 6"'  '5\' 6"'   Weight 223 lbs 217 lbs 215 lbs  BMI 36.01 67.59 16.38  Systolic 466 599 357  Diastolic 70 54 63  Pulse 64 57 61     Physical Exam Vitals reviewed.  HENT:     Head: Normocephalic and atraumatic.  Cardiovascular:     Rate and Rhythm: Normal rate and regular rhythm.     Pulses:          Carotid pulses are 2+ on the right side and 2+ on the left side with bruit.      Radial pulses are 2+ on the right side and 2+ on the left side.       Dorsalis pedis pulses are 1+ on the right side and 1+ on the left side.       Posterior tibial pulses are 1+ on the right side and 1+ on the left side.     Heart sounds:  Normal heart sounds, S1 normal and S2 normal. No murmur heard. No gallop.      Comments: Trace bilateral lower leg edema. No JVD. Pulmonary:     Effort: Pulmonary effort is normal. No respiratory distress.     Breath sounds: Normal breath sounds. No wheezing, rhonchi or rales.  Abdominal:     General: Bowel sounds are normal.     Palpations: Abdomen is soft.  Musculoskeletal:     Right lower leg: No edema.     Left lower leg: No edema.  Neurological:     Mental Status: He is alert.    Laboratory examination:   No results for input(s): NA, K, CL, CO2, GLUCOSE, BUN, CREATININE, CALCIUM, GFRNONAA, GFRAA in the last 8760 hours. CrCl cannot be calculated (Patient's most recent lab result is older than the maximum 21 days  allowed.).  CMP Latest Ref Rng & Units 11/15/2016 06/14/2007 06/11/2007  Glucose 65 - 99 mg/dL 337(H) 230(H) 141(H)  BUN 6 - 20 mg/dL '14 11 15  ' Creatinine 0.61 - 1.24 mg/dL 1.07 1.10 1.13  Sodium 135 - 145 mmol/L 134(L) 135 135  Potassium 3.5 - 5.1 mmol/L 4.6 4.2 4.0  Chloride 101 - 111 mmol/L 102 105 101  CO2 22 - 32 mmol/L '27 25 28  ' Calcium 8.9 - 10.3 mg/dL 9.2 8.7 9.2   CBC Latest Ref Rng & Units 11/15/2016 11/15/2016 06/14/2007  WBC 4.0 - 10.5 K/uL 5.0 4.2 6.2  Hemoglobin 13.0 - 17.0 g/dL 13.5 12.9(L) 12.1(L)  Hematocrit 39.0 - 52.0 % 40.7 38.8(L) 34.9(L)  Platelets 150 - 400 K/uL 242 245 219   Lipid Panel     Component Value Date/Time   CHOL 131 11/15/2016 1348   TRIG 50 11/15/2016 1348   HDL 59 11/15/2016 1348   CHOLHDL 2.2 11/15/2016 1348   VLDL 10 11/15/2016 1348   LDLCALC 62 11/15/2016 1348   HEMOGLOBIN A1C Lab Results  Component Value Date   HGBA1C 8.7 (H) 11/15/2016   MPG 203 11/15/2016   TSH No results for input(s): TSH in the last 8760 hours.  External labs:  05/28/2020: HDL 53, LDL 71, total cholesterol 138, triglycerides 64 A1c 8.0% BUN 19, creatinine 1.22, EGFR 60 TSH 1.93  Labs 08/19/2019: A1c 8.7%.  TSH normal. Serum glucose 55 mg, BUN 18, creatinine 1.0, EGFR >60 mL, potassium 4.0.  CMP otherwise normal. Total cholesterol 140, triglycerides 51, HDL 55, LDL 74.  Non-HDL cholesterol 85.  03/08/2018: TSH 2.26.  Hemoglobin 13.0, CBC otherwise normal.  Hemoglobin A1c 8.5%.  Potassium 4.4, glucose 123, creatinine 1.09, EGFR 65/79, CMP otherwise normal.  Cholesterol 138, triglycerides 43, HDL 56, LDL 73.  Medications and allergies  No Known Allergies   Current Outpatient Medications  Medication Instructions  . ACCU-CHEK AVIVA PLUS test strip 1 application, Injection, 2 times daily  . amLODipine (NORVASC) 10 mg, Oral, Daily  . aspirin 81 mg, Oral, Every evening  . isosorbide mononitrate (IMDUR) 60 MG 24 hr tablet TAKE ONE TABLET BY MOUTH DAILY  . Lantus  SoloStar 14 Units, Injection, Daily  . meloxicam (MOBIC) 7.5 mg, Oral, Daily  . metFORMIN (GLUCOPHAGE-XR) 1,000 mg, Oral, 2 times daily  . methocarbamol (ROBAXIN) 500 mg, Oral, Every 8 hours PRN  . metoprolol succinate (TOPROL-XL) 50 MG 24 hr tablet TAKE ONE TABLET BY MOUTH DAILY  . nitroGLYCERIN (NITROSTAT) 0.4 mg, Sublingual, Every 5 min PRN  . olmesartan (BENICAR) 20 MG tablet TAKE 1/2 TABLET BY MOUTH DAILY  . ondansetron (ZOFRAN) 4 mg, Oral,  Daily at bedtime  . pravastatin (PRAVACHOL) 80 mg, Oral, Every evening  . ranolazine (RANEXA) 500 mg, Oral, 2 times daily   Radiology:   No results found.  Cardiac Studies:   PCV ECHOCARDIOGRAM COMPLETE 17/40/8144 Normal LV systolic function with visual EF 55-60%. Left ventricle cavity is normal in size. Mild left ventricular hypertrophy. Normal global wall motion. Indeterminate diastolic filling pattern, normal LAP. Mild (Grade I) mitral regurgitation. Mild tricuspid regurgitation. Compared to prior study dated 09/13/2017 no significant change.  PCV MYOCARDIAL PERFUSION WO LEXISCAN 06/29/2020 Exercise nuclear stress test was performed using Bruce protocol. Patient reached 7.2 METS, and 86% of age predicted maximum heart rate. Exercise capacity was low. No chest pain reported. Heart rate and hemodynamic response were normal. Peak stress EKG showed sinus tachycardia, RBBB, nonspecific T wave changes inferior leads. SPECT images showed large sized, severe intensity, mid to basal, predominantly reversible perfusion defect in anterolateral, inferolateral myocardium. Intermediate risk study.  Carotid artery duplex 10/25/2019:  Diffuse homogeneous plaque in bilateral common carotid arteries with <50%  stenosis.  Bilateral external carotid stenosis <50%.  Antegrade bilateral vertebral flow.  Compared to 03/21/2018, left ICA stenosis of 50-69% not present.  Follow up studies when clinically indicated.  See enclosed images.  Echocardiogram  09/13/2017: Left ventricle cavity is normal in size. Mild concentric hypertrophy of the left ventricle. Normal global wall motion. Doppler evidence of grade I (impaired) diastolic dysfunction, normal LAP. Calculated EF 68%. Mild tricuspid regurgitation. Estimated pulmonary artery systolic pressure 24 mmHg.  Coronary angiogram 11/15/2016: Diffuse coronary calcification involving all the coronary arteries. Very short left main with mild calcification.  D1 ostial 60-70% stenosis.  Mid ramus intermediate 60% stenosis. Otherwise moderate scattered disease other vessels. Normal LVEF, EF estimated at 60%.  EKG   EKG 06/17/2020: Sinus rhythm at a rate of 61 bpm.  Normal axis.  Right bundle branch block. No evidence of ischemia. Compared to 09/20/2019, no significant change.   EKG 09/20/2019: Normal sinus rhythm with rate of 57 bpm, normal axis, right bundle branch block.  No evidence of ischemia.   EKG 03/30/2018: Normal sinus rhythm at rate of 63 bpm, left axis deviation, left anterior fascicular block.  Right bundle branch block.  No evidence of ischemia.   Assessment     ICD-10-CM   1. Coronary artery disease involving native coronary artery of native heart without angina pectoris  Y18.56 Basic metabolic panel    CBC    Novel Coronavirus, NAA ([facility])  2. Angina pectoris (HCC)  I20.9   3. Dyspnea on exertion  R06.00   4. Primary hypertension  I10      Meds ordered this encounter  Medications  . amLODipine (NORVASC) 10 MG tablet    Sig: Take 1 tablet (10 mg total) by mouth daily.    Dispense:  90 tablet    Refill:  3    Medications Discontinued During This Encounter  Medication Reason  . amLODipine (NORVASC) 5 MG tablet Change in therapy    Recommendations:   Mark Elliott  is a  84 y.o. African-American male with history of hypertension, hyperlipidemia, diabetes mellitus, remote history of prostate cancer, obesity who presents for follow-up of angina pectoris and carotid disease.  Coronary angiography on 11/15/2016 had revealed no high-grade stenosis but moderate disease in multiple vessels.    Patient presents for 6 week follow up of angina and dyspnea on exertion. Angina symptoms have improved however patient continues to have frequent episodes of exertional chest pain and dyspnea.  Reviewed and discussed results of echocardiogram and nuclear stress test with patient and his wife. Nuclear stress test revealed nonspecific T wave changes inferior leads with stress and large sized, severe intensity, mid to basal, predominantly reversible perfusion defect in anterolateral, inferolateral myocardium. In view of ongoing classic symptoms and stress test findings concerning for ischemia recommend cardiac catheretization. The heart catheterization procedure was explained to the patient in detail. The indication, alternatives, risks and benefits were reviewed. Complications including but not limited to bleeding, infection, acute kidney injury, blood transfusion, heart rhythm disturbances, contrast (dye) reaction, damage to the arteries or nerves in the legs or hands, cerebrovascular accident, myocardial infarction, need for emergent bypass surgery, blood clots in the legs, possible need for emergent blood transfusion, and rarely death were reviewed and discussed with the patient. The patient voices understanding and wishes to proceed.  In regard to hypertension, blood pressure is uncontrolled. Will increased amlodipine from 5 mg to 10 mg daily. Patient will monitor his blood pressure daily at home and bring a log with him to his next visit.   Follow up in 6 week for hypertension and angina following cardiac catheterization.    Alethia Berthold, PA-C 07/29/2020, 7:06 PM Office: [phone]

## 2020-07-29 ENCOUNTER — Other Ambulatory Visit: Payer: Self-pay

## 2020-07-29 ENCOUNTER — Encounter: Payer: Self-pay | Admitting: Student

## 2020-07-29 ENCOUNTER — Ambulatory Visit: Payer: Medicare Other | Admitting: Student

## 2020-07-29 VITALS — BP 150/70 | HR 64 | Temp 97.4°F | Resp 16 | Ht 66.0 in | Wt 223.0 lb

## 2020-07-29 DIAGNOSIS — I2510 Atherosclerotic heart disease of native coronary artery without angina pectoris: Secondary | ICD-10-CM

## 2020-07-29 DIAGNOSIS — I10 Essential (primary) hypertension: Secondary | ICD-10-CM

## 2020-07-29 DIAGNOSIS — I209 Angina pectoris, unspecified: Secondary | ICD-10-CM

## 2020-07-29 DIAGNOSIS — R0600 Dyspnea, unspecified: Secondary | ICD-10-CM

## 2020-07-29 DIAGNOSIS — R0609 Other forms of dyspnea: Secondary | ICD-10-CM

## 2020-07-29 MED ORDER — AMLODIPINE BESYLATE 10 MG PO TABS: 10.0000 mg | ORAL_TABLET | Freq: Every day | ORAL | 3 refills | Status: DC

## 2020-08-10 ENCOUNTER — Other Ambulatory Visit: Payer: Self-pay | Admitting: Student

## 2020-08-10 DIAGNOSIS — I2510 Atherosclerotic heart disease of native coronary artery without angina pectoris: Secondary | ICD-10-CM | POA: Diagnosis not present

## 2020-08-11 LAB — BASIC METABOLIC PANEL
BUN/Creatinine Ratio: 15 (ref 10–24)
BUN: 21 mg/dL (ref 8–27)
CO2: 23 mmol/L (ref 20–29)
Calcium: 10 mg/dL (ref 8.6–10.2)
Chloride: 98 mmol/L (ref 96–106)
Creatinine, Ser: 1.39 mg/dL — ABNORMAL HIGH (ref 0.76–1.27)
GFR calc Af Amer: 54 mL/min/{1.73_m2} — ABNORMAL LOW (ref >59)
GFR calc non Af Amer: 47 mL/min/{1.73_m2} — ABNORMAL LOW (ref >59)
Glucose: 191 mg/dL — ABNORMAL HIGH (ref 65–99)
Potassium: 5.6 mmol/L — ABNORMAL HIGH (ref 3.5–5.2)
Sodium: 133 mmol/L — ABNORMAL LOW (ref 134–144)

## 2020-08-11 LAB — CBC
Hematocrit: 39.6 % (ref 37.5–51.0)
Hemoglobin: 13.3 g/dL (ref 13.0–17.7)
MCH: 29 pg (ref 26.6–33.0)
MCHC: 33.6 g/dL (ref 31.5–35.7)
MCV: 86 fL (ref 79–97)
Platelets: 252 10*3/uL (ref 150–450)
RBC: 4.59 x10E6/uL (ref 4.14–5.80)
RDW: 12 % (ref 11.6–15.4)
WBC: 5.1 10*3/uL (ref 3.4–10.8)

## 2020-08-15 ENCOUNTER — Other Ambulatory Visit (HOSPITAL_COMMUNITY)
Admission: RE | Admit: 2020-08-15 | Discharge: 2020-08-15 | Disposition: A | Discharge status: Home / Self Care | Payer: Medicare Other | Source: Ambulatory Visit | Attending: Cardiology | Admitting: Cardiology

## 2020-08-15 DIAGNOSIS — Z20822 Contact with and (suspected) exposure to covid-19: Secondary | ICD-10-CM | POA: Insufficient documentation

## 2020-08-15 DIAGNOSIS — Z01812 Encounter for preprocedural laboratory examination: Secondary | ICD-10-CM | POA: Insufficient documentation

## 2020-08-15 LAB — SARS CORONAVIRUS 2 (TAT 6-24 HRS): SARS Coronavirus 2: NEGATIVE

## 2020-08-18 ENCOUNTER — Other Ambulatory Visit: Payer: Self-pay

## 2020-08-18 ENCOUNTER — Ambulatory Visit (HOSPITAL_COMMUNITY)
Admission: RE | Admit: 2020-08-18 | Discharge: 2020-08-18 | Disposition: A | Discharge status: Home / Self Care | Payer: Medicare Other | Attending: Cardiology | Admitting: Cardiology

## 2020-08-18 ENCOUNTER — Encounter (HOSPITAL_COMMUNITY): Payer: Self-pay | Admitting: Cardiology

## 2020-08-18 ENCOUNTER — Other Ambulatory Visit: Payer: Self-pay | Admitting: Cardiology

## 2020-08-18 ENCOUNTER — Ambulatory Visit (HOSPITAL_COMMUNITY)
Admission: RE | Disposition: A | Discharge status: Home / Self Care | Payer: Self-pay | Source: Home / Self Care | Attending: Cardiology

## 2020-08-18 DIAGNOSIS — I209 Angina pectoris, unspecified: Secondary | ICD-10-CM

## 2020-08-18 DIAGNOSIS — E119 Type 2 diabetes mellitus without complications: Secondary | ICD-10-CM | POA: Insufficient documentation

## 2020-08-18 DIAGNOSIS — I2510 Atherosclerotic heart disease of native coronary artery without angina pectoris: Secondary | ICD-10-CM | POA: Diagnosis present

## 2020-08-18 DIAGNOSIS — Z87891 Personal history of nicotine dependence: Secondary | ICD-10-CM | POA: Insufficient documentation

## 2020-08-18 DIAGNOSIS — Z794 Long term (current) use of insulin: Secondary | ICD-10-CM | POA: Insufficient documentation

## 2020-08-18 DIAGNOSIS — R0600 Dyspnea, unspecified: Secondary | ICD-10-CM | POA: Diagnosis not present

## 2020-08-18 DIAGNOSIS — Z79899 Other long term (current) drug therapy: Secondary | ICD-10-CM | POA: Insufficient documentation

## 2020-08-18 DIAGNOSIS — I25118 Atherosclerotic heart disease of native coronary artery with other forms of angina pectoris: Secondary | ICD-10-CM | POA: Diagnosis not present

## 2020-08-18 DIAGNOSIS — I10 Essential (primary) hypertension: Secondary | ICD-10-CM | POA: Diagnosis not present

## 2020-08-18 DIAGNOSIS — Z7984 Long term (current) use of oral hypoglycemic drugs: Secondary | ICD-10-CM | POA: Diagnosis not present

## 2020-08-18 DIAGNOSIS — Z7982 Long term (current) use of aspirin: Secondary | ICD-10-CM | POA: Insufficient documentation

## 2020-08-18 DIAGNOSIS — E785 Hyperlipidemia, unspecified: Secondary | ICD-10-CM | POA: Diagnosis not present

## 2020-08-18 HISTORY — PX: LEFT HEART CATH AND CORONARY ANGIOGRAPHY: CATH118249

## 2020-08-18 LAB — GLUCOSE, CAPILLARY
Glucose-Capillary: 132 mg/dL — ABNORMAL HIGH (ref 70–99)
Glucose-Capillary: 69 mg/dL — ABNORMAL LOW (ref 70–99)

## 2020-08-18 LAB — BASIC METABOLIC PANEL
Anion gap: 12 (ref 5–15)
BUN: 20 mg/dL (ref 8–23)
CO2: 23 mmol/L (ref 22–32)
Calcium: 9 mg/dL (ref 8.9–10.3)
Chloride: 102 mmol/L (ref 98–111)
Creatinine, Ser: 1.31 mg/dL — ABNORMAL HIGH (ref 0.61–1.24)
GFR, Estimated: 54 mL/min — ABNORMAL LOW (ref >60)
Glucose, Bld: 117 mg/dL — ABNORMAL HIGH (ref 70–99)
Potassium: 4.6 mmol/L (ref 3.5–5.1)
Sodium: 137 mmol/L (ref 135–145)

## 2020-08-18 LAB — POCT ACTIVATED CLOTTING TIME
Activated Clotting Time: 303 seconds
Activated Clotting Time: 267 seconds

## 2020-08-18 SURGERY — LEFT HEART CATH AND CORONARY ANGIOGRAPHY
Anesthesia: LOCAL

## 2020-08-18 MED ORDER — NITROGLYCERIN 1 MG/10 ML FOR IR/CATH LAB
INTRA_ARTERIAL | Status: DC | PRN
  Administered 2020-08-18: 100 ug via INTRA_ARTERIAL

## 2020-08-18 MED ORDER — SODIUM CHLORIDE 0.9 % WEIGHT BASED INFUSION: 1.0000 mL/kg/h | INTRAVENOUS | Status: DC

## 2020-08-18 MED ORDER — SODIUM CHLORIDE 0.9 % WEIGHT BASED INFUSION
1.0000 mL/kg/h | INTRAVENOUS | Status: DC
  Administered 2020-08-18: 500 mL via INTRAVENOUS

## 2020-08-18 MED ORDER — VERAPAMIL HCL 2.5 MG/ML IV SOLN
INTRAVENOUS | Status: AC
  Filled 2020-08-18: qty 2

## 2020-08-18 MED ORDER — LIDOCAINE HCL (PF) 1 % IJ SOLN
INTRAMUSCULAR | Status: AC
  Filled 2020-08-18: qty 30

## 2020-08-18 MED ORDER — SODIUM CHLORIDE 0.9 % IV SOLN: 250.0000 mL | INTRAVENOUS | Status: DC | PRN

## 2020-08-18 MED ORDER — IOHEXOL 350 MG/ML SOLN
INTRAVENOUS | Status: DC | PRN
  Administered 2020-08-18: 85 mL via INTRA_ARTERIAL

## 2020-08-18 MED ORDER — HEPARIN (PORCINE) IN NACL 1000-0.9 UT/500ML-% IV SOLN
INTRAVENOUS | Status: DC | PRN
  Administered 2020-08-18 (×3): 500 mL

## 2020-08-18 MED ORDER — HEPARIN SODIUM (PORCINE) 1000 UNIT/ML IJ SOLN
INTRAMUSCULAR | Status: DC | PRN
  Administered 2020-08-18: 4000 [IU] via INTRAVENOUS
  Administered 2020-08-18: 6000 [IU] via INTRAVENOUS

## 2020-08-18 MED ORDER — ASPIRIN 81 MG PO CHEW
81.0000 mg | CHEWABLE_TABLET | ORAL | Status: AC
  Administered 2020-08-18: 81 mg via ORAL
  Filled 2020-08-18: qty 1

## 2020-08-18 MED ORDER — MIDAZOLAM HCL 2 MG/2ML IJ SOLN
INTRAMUSCULAR | Status: AC
  Filled 2020-08-18: qty 2

## 2020-08-18 MED ORDER — SODIUM CHLORIDE 0.9% FLUSH: 3.0000 mL | Freq: Two times a day (BID) | INTRAVENOUS | Status: DC

## 2020-08-18 MED ORDER — ACETAMINOPHEN 325 MG PO TABS: 650.0000 mg | ORAL_TABLET | ORAL | Status: DC | PRN

## 2020-08-18 MED ORDER — ONDANSETRON HCL 4 MG/2ML IJ SOLN: 4.0000 mg | Freq: Four times a day (QID) | INTRAMUSCULAR | Status: DC | PRN

## 2020-08-18 MED ORDER — SODIUM CHLORIDE 0.9 % WEIGHT BASED INFUSION: 3.0000 mL/kg/h | INTRAVENOUS | Status: DC

## 2020-08-18 MED ORDER — VERAPAMIL HCL 2.5 MG/ML IV SOLN
INTRAVENOUS | Status: DC | PRN
  Administered 2020-08-18: 5 mL via INTRA_ARTERIAL

## 2020-08-18 MED ORDER — SODIUM CHLORIDE 0.9 % IV SOLN
250.0000 mL | INTRAVENOUS | Status: DC | PRN
Start: 2020-08-18 — End: 2020-08-18

## 2020-08-18 MED ORDER — SODIUM CHLORIDE 0.9% FLUSH
3.0000 mL | INTRAVENOUS | Status: DC | PRN
Start: 2020-08-18 — End: 2020-08-18

## 2020-08-18 MED ORDER — FENTANYL CITRATE (PF) 100 MCG/2ML IJ SOLN
INTRAMUSCULAR | Status: DC | PRN
  Administered 2020-08-18: 25 ug via INTRAVENOUS

## 2020-08-18 MED ORDER — NITROGLYCERIN IN D5W 200-5 MCG/ML-% IV SOLN
INTRAVENOUS | Status: AC
  Filled 2020-08-18: qty 250

## 2020-08-18 MED ORDER — FENTANYL CITRATE (PF) 100 MCG/2ML IJ SOLN
INTRAMUSCULAR | Status: AC
  Filled 2020-08-18: qty 2

## 2020-08-18 MED ORDER — LIDOCAINE HCL (PF) 1 % IJ SOLN
INTRAMUSCULAR | Status: DC | PRN
  Administered 2020-08-18: 2 mL

## 2020-08-18 MED ORDER — MIDAZOLAM HCL 2 MG/2ML IJ SOLN
INTRAMUSCULAR | Status: DC | PRN
  Administered 2020-08-18: 1 mg via INTRAVENOUS

## 2020-08-18 MED ORDER — METFORMIN HCL ER 500 MG PO TB24: 1000.0000 mg | ORAL_TABLET | Freq: Two times a day (BID) | ORAL | Status: DC

## 2020-08-18 MED ORDER — HEPARIN (PORCINE) IN NACL 1000-0.9 UT/500ML-% IV SOLN
INTRAVENOUS | Status: AC
  Filled 2020-08-18: qty 1000

## 2020-08-18 MED ORDER — HEPARIN SODIUM (PORCINE) 1000 UNIT/ML IJ SOLN
INTRAMUSCULAR | Status: AC
  Filled 2020-08-18: qty 1

## 2020-08-18 SURGICAL SUPPLY — 17 items
CATH LAUNCHER 6FR EBU3.5 (CATHETERS) ×1 IMPLANT
CATH OPTITORQUE TIG 4.0 5F (CATHETERS) ×1 IMPLANT
CATH TELEPORT (CATHETERS) ×1 IMPLANT
CATH VISTA GUIDE 6FR JL4 (CATHETERS) ×1 IMPLANT
DEVICE RAD COMP TR BAND LRG (VASCULAR PRODUCTS) ×1 IMPLANT
GLIDESHEATH SLEND A-KIT 6F 22G (SHEATH) ×1 IMPLANT
GUIDEWIRE INQWIRE 1.5J.035X260 (WIRE) IMPLANT
INQWIRE 1.5J .035X260CM (WIRE) ×2
KIT ENCORE 26 ADVANTAGE (KITS) ×1 IMPLANT
KIT HEART LEFT (KITS) ×2 IMPLANT
PACK CARDIAC CATHETERIZATION (CUSTOM PROCEDURE TRAY) ×2 IMPLANT
TRANSDUCER W/STOPCOCK (MISCELLANEOUS) ×2 IMPLANT
TUBING CIL FLEX 10 FLL-RA (TUBING) ×2 IMPLANT
WIRE ASAHI FIELDER XT 190CM (WIRE) ×1 IMPLANT
WIRE ASAHI MIRACLEBROS-6 300CM (WIRE) ×1 IMPLANT
WIRE COUGAR XT STRL 300CM (WIRE) ×1 IMPLANT
WIRE HI TORQ WHISPER MS 190CM (WIRE) ×1 IMPLANT

## 2020-08-18 NOTE — Interval H&P Note (Signed)
History and Physical Interval Note:  08/18/2020 8:52 AM  Mark Elliott  has presented today for surgery, with the diagnosis of CAD,chest pain.  The various methods of treatment have been discussed with the patient and family. After consideration of risks, benefits and other options for treatment, the patient has consented to  Procedure(s): LEFT HEART CATH AND CORONARY ANGIOGRAPHY (N/A) and possible angioplasty as a surgical intervention.  The patient's history has been reviewed, patient examined, no change in status, stable for surgery.  I have reviewed the patient's chart and labs.  Questions were answered to the patient's satisfaction.   Cath Lab Visit (complete for each Cath Lab visit)  Clinical Evaluation Leading to the Procedure:   ACS: Yes.    Non-ACS:    Anginal Classification: CCS III  Anti-ischemic medical therapy: Maximal Therapy (2 or more classes of medications)  Non-Invasive Test Results: Intermediate-risk stress test findings: cardiac mortality 1-3%/year  Prior CABG: No previous CABG   Mark Elliott

## 2020-08-18 NOTE — Discharge Instructions (Signed)
Radial Site Care  This sheet gives you information about how to care for yourself after your procedure. Your health care provider may also give you more specific instructions. If you have problems or questions, contact your health care provider. What can I expect after the procedure? After the procedure, it is common to have:  Bruising and tenderness at the catheter insertion area. Follow these instructions at home: Medicines  Take over-the-counter and prescription medicines only as told by your health care provider.  HOLD METFORMIN FOR A FULL 48 HOURS AFTER DISCHARGE. Insertion site care 1. Follow instructions from your health care provider about how to take care of your insertion site. Make sure you: ? Wash your hands with soap and water before you remove your bandage (dressing). If soap and water are not available, use hand sanitizer. ? May remove dressing in 24 hours. 2. Check your insertion site every day for signs of infection. Check for: ? Redness, swelling, or pain. ? Fluid or blood. ? Pus or a bad smell. ? Warmth. 3. Do no take baths, swim, or use a hot tub for 5 days. 4. You may shower 24-48 hours after the procedure. ? Remove the dressing and gently wash the site with plain soap and water. ? Pat the area dry with a clean towel. ? Do not rub the site. That could cause bleeding. 5. Do not apply powder or lotion to the site. Activity  1. For 24 hours after the procedure, or as directed by your health care provider: ? Do not flex or bend the affected arm. ? Do not push or pull heavy objects with the affected arm. ? Do not drive yourself home from the hospital or clinic. You may drive 24 hours after the procedure. ? Do not operate machinery or power tools. ? KEEP ARM ELEVATED THE REMAINDER OF THE DAY. 2. Do not push, pull or lift anything that is heavier than 10 lb for 5 days. 3. Ask your health care provider when it is okay to: ? Return to work or school. ? Resume  usual physical activities or sports. ? Resume sexual activity. General instructions  If the catheter site starts to bleed, raise your arm and put firm pressure on the site. If the bleeding does not stop, get help right away. This is a medical emergency.  DRINK PLENTY OF FLUIDS FOR THE NEXT 2-3 DAYS.  No alcohol consumption for 24 hours after receiving sedation.  If you went home on the same day as your procedure, a responsible adult should be with you for the first 24 hours after you arrive home.  Keep all follow-up visits as told by your health care provider. This is important. Contact a health care provider if:  You have a fever.  You have redness, swelling, or yellow drainage around your insertion site. Get help right away if:  You have unusual pain at the radial site.  The catheter insertion area swells very fast.  The insertion area is bleeding, and the bleeding does not stop when you hold steady pressure on the area.  Your arm or hand becomes pale, cool, tingly, or numb. These symptoms may represent a serious problem that is an emergency. Do not wait to see if the symptoms will go away. Get medical help right away. Call your local emergency services (911 in the [place] Do not drive yourself to the hospital. Summary  After the procedure, it is common to have bruising and tenderness at the site.  Follow instructions  from your health care provider about how to take care of your radial site wound. Check the wound every day for signs of infection.  This information is not intended to replace advice given to you by your health care provider. Make sure you discuss any questions you have with your health care provider. Document Revised: 08/02/2017 Document Reviewed: 08/02/2017 Elsevier Patient Education  2020 [employer].

## 2020-08-18 NOTE — Progress Notes (Signed)
ICD-10-CM   1. Angina pectoris ([place])  I20.9 Ambulatory referral to Cardiology  2. Coronary artery disease involving native coronary artery of native heart without angina pectoris  I25.10 Ambulatory referral to Cardiology    Orders Placed This Encounter  Procedures  . Ambulatory referral to Cardiology    Referral Priority:   Routine    Referral Type:   Consultation    Referral Reason:   Specialty Services Required    Referred to Provider:   Martinique, Peter M, MD    Requested Specialty:   Cardiology    Number of Visits Requested:   1     [name], MD, [facility] 08/18/2020, 11:33 AM Office: [phone] Pager: [phone]

## 2020-08-19 MED FILL — Nitroglycerin IV Soln 200 MCG/ML in D5W: INTRAVENOUS | Qty: 250 | Status: AC

## 2020-09-09 ENCOUNTER — Ambulatory Visit: Payer: Medicare Other | Admitting: Student

## 2020-09-10 NOTE — H&P (View-Only) (Signed)
Cardiology Office Note   Date:  09/11/2020   ID:  Mark Elliott, DOB 13-Oct-1935, MRN [id number]  PCP:  Seward Carol, MD  Cardiologist:   [name] MD  Chief Complaint  Patient presents with  . Chest Pain  . Coronary Artery Disease      History of Present Illness: Mark Elliott is a 85 y.o. male who is seen at the request of Dr Einar Gip for consideration of CTO PCI. He has a history of DM, HTN,  and HLD. He presented in January with progressive symptoms of angina and DOE. He underwent evaluation with Echo which was normal. Myoview showed  large sized, severe intensity, mid to basal, predominantly reversible perfusion defect in anterolateral, inferolateral myocardium. Medications have been maximized with persistent symptoms. He underwent cardiac cath on 08/18/20 by Dr Einar Gip demonstrating occlusion of the LCx at the ostium. This was a large branch. PCI was attempted but unsuccessful due to inability to cross the lesion with a wire.   The patient reports symptoms of angina for the past year. He is still quite active. 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 Complains of dyspnea and fatigue with exertion. Also experiences tightness in the chest that scares him. Symptoms do improve with sl Ntg and rest. No rest angina. He states he has significantly curtailed his activity because of these symptoms.     Past Medical History:  Diagnosis Date  . Cancer ([place])   . Diabetes mellitus without complication ([place])   . Hyperlipidemia     Past Surgical History:  Procedure Laterality Date  . ANTERIOR FUSION CERVICAL SPINE    . HERNIA REPAIR    . LEFT HEART CATH AND CORONARY ANGIOGRAPHY N/A 11/15/2016   Procedure: Left Heart Cath and Coronary Angiography;  Surgeon: [name], MD;  Location: [place] [facility];  Service: Cardiovascular;  Laterality: N/A;  . LEFT HEART CATH AND CORONARY ANGIOGRAPHY N/A 08/18/2020   Procedure: LEFT HEART CATH AND CORONARY ANGIOGRAPHY;  Surgeon:  [name], MD;  Location: [place];  Service: Cardiovascular;  Laterality: N/A;     Current Outpatient Medications  Medication Sig Dispense Refill  . ACCU-CHEK AVIVA PLUS test strip Inject 1 application as directed 2 (two) times daily.    Marland Kitchen amLODipine (NORVASC) 5 MG tablet Take 5 mg by mouth daily.    Marland Kitchen aspirin 81 MG tablet Take 81 mg by mouth every evening.     . isosorbide mononitrate (IMDUR) 60 MG 24 hr tablet TAKE ONE TABLET BY MOUTH DAILY (Patient taking differently: Take 60 mg by mouth daily.) 90 tablet 1  . LANTUS SOLOSTAR 100 UNIT/ML Solostar Pen Inject 16 Units as directed daily.    . metFORMIN (GLUCOPHAGE-XR) 500 MG 24 hr tablet Take 2 tablets (1,000 mg total) by mouth 2 (two) times daily.    . metoprolol succinate (TOPROL-XL) 50 MG 24 hr tablet TAKE ONE TABLET BY MOUTH DAILY (Patient taking differently: Take 50 mg by mouth daily.) 90 tablet 3  . nitroGLYCERIN (NITROSTAT) 0.4 MG SL tablet Place 1 tablet (0.4 mg total) under the tongue every 5 (five) minutes as needed for chest pain. 25 tablet 3  . olmesartan (BENICAR) 20 MG tablet TAKE 1/2 TABLET BY MOUTH DAILY (Patient taking differently: Take 10 mg by mouth daily.) 45 tablet 1  . pravastatin (PRAVACHOL) 80 MG tablet Take 1 tablet (80 mg total) by mouth every evening. 90 tablet 3  . ranolazine (RANEXA) 500 MG 12 hr tablet  Take 500 mg by mouth 2 (two) times daily.     No current facility-administered medications for this visit.    Allergies:   Patient has no known allergies.    Social History:  The patient  reports that he quit smoking about 46 years ago. He has never used smokeless tobacco. He reports that he does not drink alcohol and does not use drugs.   Family History:  The patient's family history includes Cancer in his brother and father.    ROS:  Please see the history of present illness.   Otherwise, review of systems are positive for none.   All other systems are reviewed and negative.    PHYSICAL  EXAM: VS:  BP 122/60   Pulse (!) 47   Ht '5\' 6"'  (1.676 m)   Wt 223 lb 6.4 oz (101.3 kg)   BMI 36.06 kg/m  , BMI Body mass index is 36.06 kg/m. GEN: Well nourished, well developed, in no acute distress  HEENT: normal  Neck: no JVD, carotid bruits, or masses Cardiac: RRR; no murmurs, rubs, or gallops,no edema  Respiratory:  clear to auscultation bilaterally, normal work of breathing GI: soft, nontender, nondistended, + BS MS: no deformity or atrophy  Skin: warm and dry, no rash Neuro:  Strength and sensation are intact Psych: euthymic mood, full affect   EKG:  EKG is ordered today. The ekg ordered today demonstrates NSR with rate 47. RBBB. I have personally reviewed and interpreted this study.    Recent Labs: 08/10/2020: Hemoglobin 13.3; Platelets 252 08/18/2020: BUN 20; Creatinine, Ser 1.31; Potassium 4.6; Sodium 137    Lipid Panel    Component Value Date/Time   CHOL 131 11/15/2016 1348   TRIG 50 11/15/2016 1348   HDL 59 11/15/2016 1348   CHOLHDL 2.2 11/15/2016 1348   VLDL 10 11/15/2016 1348   LDLCALC 62 11/15/2016 1348   Labs:  05/28/2020: HDL 53, LDL 71, total cholesterol 138, triglycerides 64 A1c 8.0% BUN 19, creatinine 1.22, EGFR 60 TSH 1.93  Labs 08/19/2019: A1c 8.7%.  TSH normal. Serum glucose 55 mg, BUN 18, creatinine 1.0, EGFR >60 mL, potassium 4.0.  CMP otherwise normal. Total cholesterol 140, triglycerides 51, HDL 55, LDL 74.  Non-HDL cholesterol 85.  Wt Readings from Last 3 Encounters:  09/11/20 223 lb 6.4 oz (101.3 kg)  08/18/20 224 lb (101.6 kg)  07/29/20 223 lb (101.2 kg)      Other studies Reviewed: Additional studies/ records that were reviewed today include:    Records from Dr Irven Shelling office: PCV ECHOCARDIOGRAM COMPLETE 15/40/0867 Normal LV systolic function with visual EF 55-60%. Left ventricle cavity is normal in size. Mild left ventricular hypertrophy. Normal global wall motion. Indeterminate diastolic filling pattern, normal LAP. Mild  (Grade I) mitral regurgitation. Mild tricuspid regurgitation. Compared to prior study dated 09/13/2017 no significant change.  PCV MYOCARDIAL PERFUSION WO LEXISCAN 06/29/2020 Exercise nuclear stress test was performed using Bruce protocol. Patient reached 7.2 METS, and 86% of age predicted maximum heart rate. Exercise capacity was low. No chest pain reported. Heart rate and hemodynamic response were normal. Peak stress EKG showed sinus tachycardia, RBBB, nonspecific T wave changes inferior leads. SPECT images showed large sized, severe intensity, mid to basal, predominantly reversible perfusion defect in anterolateral, inferolateral myocardium. Intermediate risk study.  Carotid artery duplex 10/25/2019:  Diffuse homogeneous plaque in bilateral common carotid arteries with <50%  stenosis.  Bilateral external carotid stenosis <50%.  Antegrade bilateral vertebral flow.  Compared to 03/21/2018, left ICA stenosis of  50-69% not present.  Follow up studies when clinically indicated.  See enclosed images.  Echocardiogram 09/13/2017: Left ventricle cavity is normal in size. Mild concentric hypertrophy of the left ventricle. Normal global wall motion. Doppler evidence of grade I (impaired) diastolic dysfunction, normal LAP. Calculated EF 68%. Mild tricuspid regurgitation. Estimated pulmonary artery systolic pressure 24 mmHg.  Coronary angiogram 11/15/2016: Diffuse coronary calcification involving all the coronary arteries. Very short left main with mild calcification.  D1 ostial 60-70% stenosis.  Mid ramus intermediate 60% stenosis. Otherwise moderate scattered disease other vessels. Normal LVEF, EF estimated at 60%.  Cardiac cath 08/18/20:  LEFT HEART CATH AND CORONARY ANGIOGRAPHY    Conclusion  Left Heart Catheterization 08/18/20: LV: Normal LV systolic function. Normal EDP. No pressure gradient across the aortic valve. Left main: Very short. Mildly calcified. LAD: Large caliber vessel.  Has moderate diffuse coronary calcification. Proximal segment has a 30 to 35% stenosis and mid to distal segment is diffusely diseased and focal 30% stenosis in the distal segment. No significant change from prior angiography 2018. Circumflex: Very large caliber vessel and a large vessel. Heavily calcified and flush occluded at the ostium. Faint TIMI I flow is evident distally. Collaterals are evident from the RCA to the circumflex. RCA: Dominant. Mild to moderate disease in the proximal segment with calcification. Tortuous. Distal right has calcific 60% stenosis, again not significantly changed from prior angiography in 2018. Collaterals are noted to the circumflex. Attempted angioplasty and recommendations: In spite of aggressive attempted angioplasty, failed attempt. I will refer the patient to Dr. Arti Trang Martinique to see whether she would be a candidate for attempted revascularization of the CTO, he has gracefully reviewed the angiograms and is willing to see the patient in the outpatient basis first. 85 mL contrast utilized. He will be discharged home with outpatient follow-up.   [name], MD, [facility]  ASSESSMENT AND PLAN:  1.  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 Stress Myoview with at least intermediate risk with inferolateral and anterolateral ischemia. Cardiac cath films personally reviewed. Patient has an occlusion of the ostial LCx with significant calcification. He also has an eccentric calcified stenosis in the proximal LAD. In some views this appears modest 40-50% but in other views- particularly the caudal views it appears more severe. Attempt at PCI of the LCx previously was unsuccessful due to inability to pass a wire. We are seeing to consider CTO PCI. If the LAD stenosis is not hemodynamically significant then I think attempt at PCI of the LCx is reasonable. It would primarily be an antegrade wire  escalation attempt. He does not have interventional collaterals and subintimal reentry technique would not be feasible given proximity to the left main and heavy calcification. If we were able to cross the lesion with a wire atherectomy would probably be needed. If, on the other hand the LAD stenosis is hemodynamically significant then I think a better option would be to have CABG. His Myoview suggests there is ischemia in this territory. I would recommend initially evaluating the LAD with FFR. If abnormal then I would favor surgical consultation.  I discussed fully with him CTO approach with dual access. The procedure and risks were reviewed including but not limited to death, myocardial infarction, perforation, stroke, arrythmias, bleeding, transfusion, emergency surgery, dye allergy, or renal dysfunction. The patient voices understanding and is agreeable to proceed.   2. DM type 2 not on insulin  3. HLD on  pravastatin.  4. HTN controlled.     Current medicines are reviewed at length with the patient today.  The patient does not have concerns regarding medicines.  The following changes have been made:  no change  Labs/ tests ordered today include:   Orders Placed This Encounter  Procedures  . EKG 12-Lead     Disposition:   Plan cardiac cath with FFR of the LAD and possible CTO PCI of the LCx on March 23.   Signed, Daishaun Ayre [place], MD  09/11/2020 8:31 AM    [place] [address] [place], [place], [address] Phone [phone], Fax [phone]

## 2020-09-10 NOTE — Progress Notes (Signed)
Cardiology Office Note   Date:  09/11/2020   ID:  Mark Elliott, DOB [birth year], MRN [id number]  PCP:  Seward Carol, MD  Cardiologist:   [name] MD  Chief Complaint  Patient presents with  . Chest Pain  . Coronary Artery Disease      History of Present Illness: Mark Elliott is a 85 y.o. male who is seen at the request of Dr Einar Gip for consideration of CTO PCI. He has a history of DM, HTN,  and HLD. He presented in January with progressive symptoms of angina and DOE. He underwent evaluation with Echo which was normal. Myoview showed  large sized, severe intensity, mid to basal, predominantly reversible perfusion defect in anterolateral, inferolateral myocardium. Medications have been maximized with persistent symptoms. He underwent cardiac cath on 08/18/20 by Dr Einar Gip demonstrating occlusion of the LCx at the ostium. This was a large branch. PCI was attempted but unsuccessful due to inability to cross the lesion with a wire.   The patient reports symptoms of angina for the past year. He is still quite active. 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 Complains of dyspnea and fatigue with exertion. Also experiences tightness in the chest that scares him. Symptoms do improve with sl Ntg and rest. No rest angina. He states he has significantly curtailed his activity because of these symptoms.     Past Medical History:  Diagnosis Date  . Cancer ([place])   . Diabetes mellitus without complication ([place])   . Hyperlipidemia     Past Surgical History:  Procedure Laterality Date  . ANTERIOR FUSION CERVICAL SPINE    . HERNIA REPAIR    . LEFT HEART CATH AND CORONARY ANGIOGRAPHY N/A 11/15/2016   Procedure: Left Heart Cath and Coronary Angiography;  Surgeon: [name], MD;  Location: [facility];  Service: Cardiovascular;  Laterality: N/A;  . LEFT HEART CATH AND CORONARY ANGIOGRAPHY N/A 08/18/2020   Procedure: LEFT HEART CATH AND CORONARY ANGIOGRAPHY;  Surgeon:  [name], MD;  Location: [facility];  Service: Cardiovascular;  Laterality: N/A;     Current Outpatient Medications  Medication Sig Dispense Refill  . ACCU-CHEK AVIVA PLUS test strip Inject 1 application as directed 2 (two) times daily.    Marland Kitchen amLODipine (NORVASC) 5 MG tablet Take 5 mg by mouth daily.    Marland Kitchen aspirin 81 MG tablet Take 81 mg by mouth every evening.     . isosorbide mononitrate (IMDUR) 60 MG 24 hr tablet TAKE ONE TABLET BY MOUTH DAILY (Patient taking differently: Take 60 mg by mouth daily.) 90 tablet 1  . LANTUS SOLOSTAR 100 UNIT/ML Solostar Pen Inject 16 Units as directed daily.    . metFORMIN (GLUCOPHAGE-XR) 500 MG 24 hr tablet Take 2 tablets (1,000 mg total) by mouth 2 (two) times daily.    . metoprolol succinate (TOPROL-XL) 50 MG 24 hr tablet TAKE ONE TABLET BY MOUTH DAILY (Patient taking differently: Take 50 mg by mouth daily.) 90 tablet 3  . nitroGLYCERIN (NITROSTAT) 0.4 MG SL tablet Place 1 tablet (0.4 mg total) under the tongue every 5 (five) minutes as needed for chest pain. 25 tablet 3  . olmesartan (BENICAR) 20 MG tablet TAKE 1/2 TABLET BY MOUTH DAILY (Patient taking differently: Take 10 mg by mouth daily.) 45 tablet 1  . pravastatin (PRAVACHOL) 80 MG tablet Take 1 tablet (80 mg total) by mouth every evening. 90 tablet 3  . ranolazine (RANEXA) 500 MG 12 hr tablet  Take 500 mg by mouth 2 (two) times daily.     No current facility-administered medications for this visit.    Allergies:   Patient has no known allergies.    Social History:  The patient  reports that he quit smoking about 46 years ago. He has never used smokeless tobacco. He reports that he does not drink alcohol and does not use drugs.   Family History:  The patient's family history includes Cancer in his brother and father.    ROS:  Please see the history of present illness.   Otherwise, review of systems are positive for none.   All other systems are reviewed and negative.    PHYSICAL  EXAM: VS:  BP 122/60   Pulse (!) 47   Ht '5\' 6"'  (1.676 m)   Wt 223 lb 6.4 oz (101.3 kg)   BMI 36.06 kg/m  , BMI Body mass index is 36.06 kg/m. GEN: Well nourished, well developed, in no acute distress  HEENT: normal  Neck: no JVD, carotid bruits, or masses Cardiac: RRR; no murmurs, rubs, or gallops,no edema  Respiratory:  clear to auscultation bilaterally, normal work of breathing GI: soft, nontender, nondistended, + BS MS: no deformity or atrophy  Skin: warm and dry, no rash Neuro:  Strength and sensation are intact Psych: euthymic mood, full affect   EKG:  EKG is ordered today. The ekg ordered today demonstrates NSR with rate 47. RBBB. I have personally reviewed and interpreted this study.    Recent Labs: 08/10/2020: Hemoglobin 13.3; Platelets 252 08/18/2020: BUN 20; Creatinine, Ser 1.31; Potassium 4.6; Sodium 137    Lipid Panel    Component Value Date/Time   CHOL 131 11/15/2016 1348   TRIG 50 11/15/2016 1348   HDL 59 11/15/2016 1348   CHOLHDL 2.2 11/15/2016 1348   VLDL 10 11/15/2016 1348   LDLCALC 62 11/15/2016 1348   Labs:  05/28/2020: HDL 53, LDL 71, total cholesterol 138, triglycerides 64 A1c 8.0% BUN 19, creatinine 1.22, EGFR 60 TSH 1.93  Labs 08/19/2019: A1c 8.7%.  TSH normal. Serum glucose 55 mg, BUN 18, creatinine 1.0, EGFR >60 mL, potassium 4.0.  CMP otherwise normal. Total cholesterol 140, triglycerides 51, HDL 55, LDL 74.  Non-HDL cholesterol 85.  Wt Readings from Last 3 Encounters:  09/11/20 223 lb 6.4 oz (101.3 kg)  08/18/20 224 lb (101.6 kg)  07/29/20 223 lb (101.2 kg)      Other studies Reviewed: Additional studies/ records that were reviewed today include:    Records from Dr Irven Shelling office: PCV ECHOCARDIOGRAM COMPLETE 60/04/9322 Normal LV systolic function with visual EF 55-60%. Left ventricle cavity is normal in size. Mild left ventricular hypertrophy. Normal global wall motion. Indeterminate diastolic filling pattern, normal LAP. Mild  (Grade I) mitral regurgitation. Mild tricuspid regurgitation. Compared to prior study dated 09/13/2017 no significant change.  PCV MYOCARDIAL PERFUSION WO LEXISCAN 06/29/2020 Exercise nuclear stress test was performed using [place] protocol. Patient reached 7.2 METS, and 86% of age predicted maximum heart rate. Exercise capacity was low. No chest pain reported. Heart rate and hemodynamic response were normal. Peak stress EKG showed sinus tachycardia, RBBB, nonspecific T wave changes inferior leads. SPECT images showed large sized, severe intensity, mid to basal, predominantly reversible perfusion defect in anterolateral, inferolateral myocardium. Intermediate risk study.  Carotid artery duplex 10/25/2019:  Diffuse homogeneous plaque in bilateral common carotid arteries with <50%  stenosis.  Bilateral external carotid stenosis <50%.  Antegrade bilateral vertebral flow.  Compared to 03/21/2018, left ICA stenosis of  50-69% not present.  Follow up studies when clinically indicated.  See enclosed images.  Echocardiogram 09/13/2017: Left ventricle cavity is normal in size. Mild concentric hypertrophy of the left ventricle. Normal global wall motion. Doppler evidence of grade I (impaired) diastolic dysfunction, normal LAP. Calculated EF 68%. Mild tricuspid regurgitation. Estimated pulmonary artery systolic pressure 24 mmHg.  Coronary angiogram 11/15/2016: Diffuse coronary calcification involving all the coronary arteries. Very short left main with mild calcification.  D1 ostial 60-70% stenosis.  Mid ramus intermediate 60% stenosis. Otherwise moderate scattered disease other vessels. Normal LVEF, EF estimated at 60%.  Cardiac cath 08/18/20:  LEFT HEART CATH AND CORONARY ANGIOGRAPHY    Conclusion  Left Heart Catheterization 08/18/20: LV: Normal LV systolic function. Normal EDP. No pressure gradient across the aortic valve. Left main: Very short. Mildly calcified. LAD: Large caliber vessel.  Has moderate diffuse coronary calcification. Proximal segment has a 30 to 35% stenosis and mid to distal segment is diffusely diseased and focal 30% stenosis in the distal segment. No significant change from prior angiography 2018. Circumflex: Very large caliber vessel and a large vessel. Heavily calcified and flush occluded at the ostium. Faint TIMI I flow is evident distally. Collaterals are evident from the RCA to the circumflex. RCA: Dominant. Mild to moderate disease in the proximal segment with calcification. Tortuous. Distal right has calcific 60% stenosis, again not significantly changed from prior angiography in 2018. Collaterals are noted to the circumflex. Attempted angioplasty and recommendations: In spite of aggressive attempted angioplasty, failed attempt. I will refer the patient to Dr. Shylynn Bruning Martinique to see whether she would be a candidate for attempted revascularization of the CTO, he has gracefully reviewed the angiograms and is willing to see the patient in the outpatient basis first. 85 mL contrast utilized. He will be discharged home with outpatient follow-up.   [name], MD, [facility]  ASSESSMENT AND PLAN:  1.  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 Stress Myoview with at least intermediate risk with inferolateral and anterolateral ischemia. Cardiac cath films personally reviewed. Patient has an occlusion of the ostial LCx with significant calcification. He also has an eccentric calcified stenosis in the proximal LAD. In some views this appears modest 40-50% but in other views- particularly the caudal views it appears more severe. Attempt at PCI of the LCx previously was unsuccessful due to inability to pass a wire. We are seeing to consider CTO PCI. If the LAD stenosis is not hemodynamically significant then I think attempt at PCI of the LCx is reasonable. It would primarily be an antegrade wire  escalation attempt. He does not have interventional collaterals and subintimal reentry technique would not be feasible given proximity to the left main and heavy calcification. If we were able to cross the lesion with a wire atherectomy would probably be needed. If, on the other hand the LAD stenosis is hemodynamically significant then I think a better option would be to have CABG. His Myoview suggests there is ischemia in this territory. I would recommend initially evaluating the LAD with FFR. If abnormal then I would favor surgical consultation.  I discussed fully with him CTO approach with dual access. The procedure and risks were reviewed including but not limited to death, myocardial infarction, perforation, stroke, arrythmias, bleeding, transfusion, emergency surgery, dye allergy, or renal dysfunction. The patient voices understanding and is agreeable to proceed.   2. DM type 2 not on insulin  3. HLD on  pravastatin.  4. HTN controlled.     Current medicines are reviewed at length with the patient today.  The patient does not have concerns regarding medicines.  The following changes have been made:  no change  Labs/ tests ordered today include:   Orders Placed This Encounter  Procedures  . EKG 12-Lead     Disposition:   Plan cardiac cath with FFR of the LAD and possible CTO PCI of the LCx on March 23.   Signed, Timya Trimmer [place], MD  09/11/2020 8:31 AM    [facility] [address], [place], [place], [address] Phone [phone], Fax [phone]

## 2020-09-11 ENCOUNTER — Other Ambulatory Visit: Payer: Self-pay | Admitting: Cardiology

## 2020-09-11 ENCOUNTER — Other Ambulatory Visit: Payer: Self-pay

## 2020-09-11 ENCOUNTER — Ambulatory Visit: Payer: Medicare Other | Admitting: Cardiology

## 2020-09-11 ENCOUNTER — Encounter: Payer: Self-pay | Admitting: Cardiology

## 2020-09-11 VITALS — BP 122/60 | HR 47 | Ht 66.0 in | Wt 223.4 lb

## 2020-09-11 DIAGNOSIS — E119 Type 2 diabetes mellitus without complications: Secondary | ICD-10-CM

## 2020-09-11 DIAGNOSIS — I209 Angina pectoris, unspecified: Secondary | ICD-10-CM

## 2020-09-11 DIAGNOSIS — I25118 Atherosclerotic heart disease of native coronary artery with other forms of angina pectoris: Secondary | ICD-10-CM | POA: Diagnosis not present

## 2020-09-11 DIAGNOSIS — E7800 Pure hypercholesterolemia, unspecified: Secondary | ICD-10-CM

## 2020-09-11 DIAGNOSIS — I10 Essential (primary) hypertension: Secondary | ICD-10-CM

## 2020-09-11 LAB — CBC WITH DIFFERENTIAL/PLATELET
Basophils Absolute: 0 10*3/uL (ref 0.0–0.2)
Basos: 1 %
EOS (ABSOLUTE): 0.1 10*3/uL (ref 0.0–0.4)
Eos: 1 %
Hematocrit: 36.2 % — ABNORMAL LOW (ref 37.5–51.0)
Hemoglobin: 12.1 g/dL — ABNORMAL LOW (ref 13.0–17.7)
Immature Grans (Abs): 0 10*3/uL (ref 0.0–0.1)
Immature Granulocytes: 0 %
Lymphocytes Absolute: 1.6 10*3/uL (ref 0.7–3.1)
Lymphs: 39 %
MCH: 29.5 pg (ref 26.6–33.0)
MCHC: 33.4 g/dL (ref 31.5–35.7)
MCV: 88 fL (ref 79–97)
Monocytes Absolute: 0.4 10*3/uL (ref 0.1–0.9)
Monocytes: 9 %
Neutrophils Absolute: 2.1 10*3/uL (ref 1.4–7.0)
Neutrophils: 50 %
Platelets: 234 10*3/uL (ref 150–450)
RBC: 4.1 x10E6/uL — ABNORMAL LOW (ref 4.14–5.80)
RDW: 12.2 % (ref 11.6–15.4)
WBC: 4.2 10*3/uL (ref 3.4–10.8)

## 2020-09-11 LAB — PT AND PTT
INR: 1 (ref 0.9–1.2)
Prothrombin Time: 10.4 s (ref 9.1–12.0)
aPTT: 32 s (ref 24–33)

## 2020-09-11 LAB — BASIC METABOLIC PANEL
BUN/Creatinine Ratio: 16 (ref 10–24)
BUN: 22 mg/dL (ref 8–27)
CO2: 23 mmol/L (ref 20–29)
Calcium: 9.4 mg/dL (ref 8.6–10.2)
Chloride: 100 mmol/L (ref 96–106)
Creatinine, Ser: 1.4 mg/dL — ABNORMAL HIGH (ref 0.76–1.27)
Glucose: 74 mg/dL (ref 65–99)
Potassium: 4.7 mmol/L (ref 3.5–5.2)
Sodium: 136 mmol/L (ref 134–144)
eGFR: 50 mL/min/{1.73_m2} — ABNORMAL LOW (ref >59)

## 2020-09-11 MED ORDER — SODIUM CHLORIDE 0.9% FLUSH: 3.0000 mL | Freq: Two times a day (BID) | INTRAVENOUS | Status: DC

## 2020-09-11 MED ORDER — PRAVASTATIN SODIUM 80 MG PO TABS: 80.0000 mg | ORAL_TABLET | Freq: Every evening | ORAL | 3 refills | Status: DC

## 2020-09-11 NOTE — Patient Instructions (Addendum)
Medication Instructions:  Continue same medications *If you need a refill on your cardiac medications before your next appointment, please call your pharmacy*   Lab Work: Bmet,cbc,pt today 3/4 at [place] office  Covid Test  Saturday 09/26/20 at 10:55 am at  [place] until after cath   Testing/Procedures: Cardiac Cath     Follow instructions below   Follow-Up: At [facility], you and your health needs are our priority.  As part of our continuing mission to provide you with exceptional heart care, we have created designated Provider Care Teams.  These Care Teams include your primary Cardiologist (physician) and Advanced Practice Providers (APPs -  Physician Assistants and Nurse Practitioners) who all work together to provide you with the care you need, when you need it.  We recommend signing up for the patient portal called "MyChart".  Sign up information is provided on this After Visit Summary.  MyChart is used to connect with patients for Virtual Visits (Telemedicine).  Patients are able to view lab/test results, encounter notes, upcoming appointments, etc.  Non-urgent messages can be sent to your provider as well.   To learn more about what you can do with MyChart, go to [email or web address].    Your next appointment:  Thursday 10/15/20 at 11:40 am   The format for your next appointment: Office    Provider:  [place] [address] Dept: [phone] Loc: [phone]  NAHUEL WILBERT  09/11/2020  You are scheduled for a Cardiac Cath Wednesday 09/30/20 with Dr.Jordan.  1. Please arrive at the [facility] (Main Entrance A) at [facility]: [address] [place],  [address] at 6:30 am (This time is two hours before your procedure to ensure your preparation). Free valet parking service  is available.   Special note: Every effort is made to have your procedure done on time. Please understand that emergencies sometimes delay scheduled procedures.  2. Diet: Do not eat solid foods after midnight.  The patient may have clear liquids until 5am upon the day of the procedure.  3. Labs: You will need to have blood drawn on Friday 3/4 at [place] office You do not need to be fasting.  Covid Test   Saturday 09/26/20 at 10:55 am at  [place] until after cath  4. Medication instructions in preparation for your procedure:   Hold Metformin day of cath and Hold 2 days after cath  Lantus insulin   Take 1/2 dose night before cath      On the morning of your procedure, take your Aspirin 81 mg and any morning medicines NOT listed above.  You may use sips of water.  5. Plan for one night stay--bring personal belongings. 6. Bring a current list of your medications and current insurance cards. 7. You MUST have a responsible person to drive you home. 8. Someone MUST be with you the first 24 hours after you arrive home or your discharge will be delayed. 9. Please wear clothes that are easy to get on and off and wear slip-on shoes.  Thank you for allowing [place] to care for you!   -- [facility]

## 2020-09-21 ENCOUNTER — Ambulatory Visit: Payer: Medicare Other | Admitting: Cardiology

## 2020-09-21 DIAGNOSIS — H04123 Dry eye syndrome of bilateral lacrimal glands: Secondary | ICD-10-CM | POA: Diagnosis not present

## 2020-09-21 DIAGNOSIS — Z961 Presence of intraocular lens: Secondary | ICD-10-CM | POA: Diagnosis not present

## 2020-09-21 DIAGNOSIS — E119 Type 2 diabetes mellitus without complications: Secondary | ICD-10-CM | POA: Diagnosis not present

## 2020-09-21 DIAGNOSIS — H40013 Open angle with borderline findings, low risk, bilateral: Secondary | ICD-10-CM | POA: Diagnosis not present

## 2020-09-22 ENCOUNTER — Telehealth: Payer: Self-pay

## 2020-09-22 NOTE — Telephone Encounter (Signed)
Pts wife called wanting to know if the pt should keep his appointment for next week. He is having a heart cath done but she does not find it necessary to come in and wants to know if she should cancel.

## 2020-09-22 NOTE — Telephone Encounter (Signed)
Called the front, he is being removed from the schedule for tomorrow.

## 2020-09-22 NOTE — Telephone Encounter (Signed)
You can advise her that they do not need to come for appt in our office tomorrow. Please cancel tomorrow's appointment in view of cath scheduled for next week with Dr. Martinique.

## 2020-09-23 ENCOUNTER — Ambulatory Visit: Payer: Medicare Other | Admitting: Student

## 2020-09-26 ENCOUNTER — Other Ambulatory Visit (HOSPITAL_COMMUNITY)
Admission: RE | Admit: 2020-09-26 | Discharge: 2020-09-26 | Disposition: A | Discharge status: Home / Self Care | Payer: Medicare Other | Source: Ambulatory Visit | Attending: Cardiology | Admitting: Cardiology

## 2020-09-26 DIAGNOSIS — Z20822 Contact with and (suspected) exposure to covid-19: Secondary | ICD-10-CM | POA: Diagnosis not present

## 2020-09-26 DIAGNOSIS — Z01812 Encounter for preprocedural laboratory examination: Secondary | ICD-10-CM | POA: Insufficient documentation

## 2020-09-27 LAB — SARS CORONAVIRUS 2 (TAT 6-24 HRS): SARS Coronavirus 2: NEGATIVE

## 2020-09-27 IMAGING — XA DG MYELOGRAPHY LUMBAR INJ LUMBOSACRAL
13 of 16 series · 13 of 16 positions shown · non-contrast
Comparison: CT abdomen and pelvis 07/09/2013. Chest radiographs
11/15/2016.

CLINICAL DATA: Left hip, groin, and anterior thigh pain.
TECHNIQUE: Contiguous axial images were obtained through the Lumbar spine after
the intrathecal infusion of infusion. Coronal and sagittal
reconstructions were obtained of the axial image sets.

[Series 1: vasc adipose · 1 of 1 slices shown (1 of 11)]
[im 1/1]
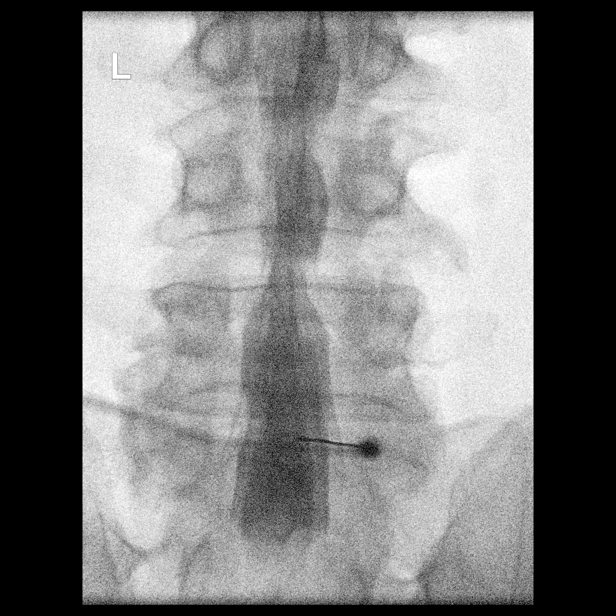

[Series 1: w lumbar spine lat · 0.15mm/px · 1 of 1 slices shown]
[im 1/1]
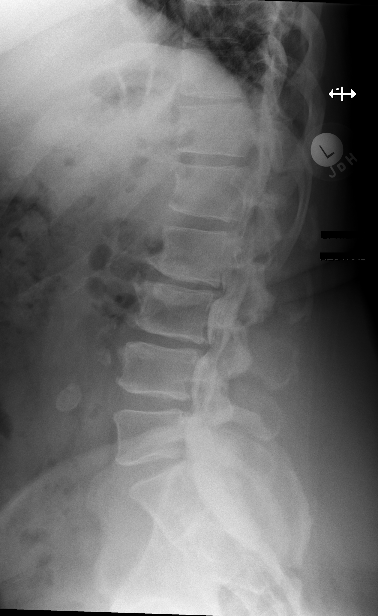

[Series 2: vasc adipose · 1 of 1 slices shown (2 of 11)]
[im 1/1]
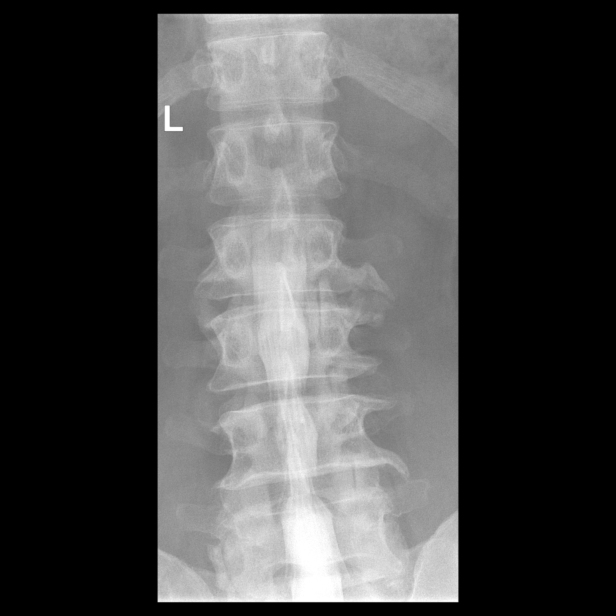

[Series 3: vasc adipose · 1 of 1 slices shown (3 of 11)]
[im 1/1]
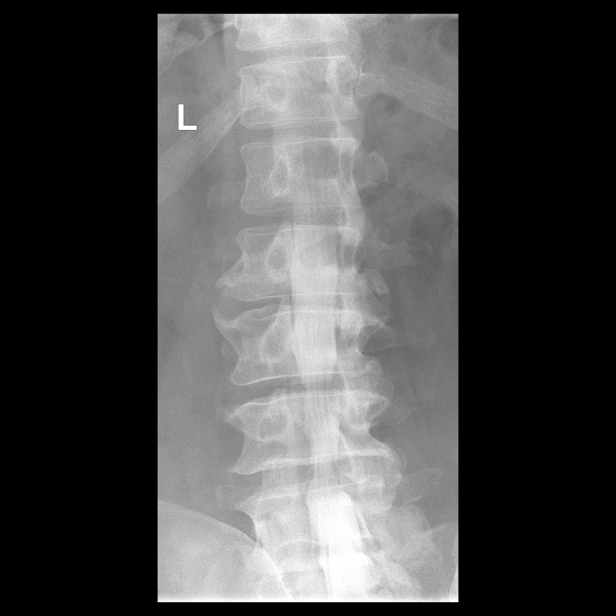

[Series 3: w lumbar spine extension · 0.15mm/px · 1 of 1 slices shown]
[im 1/1]
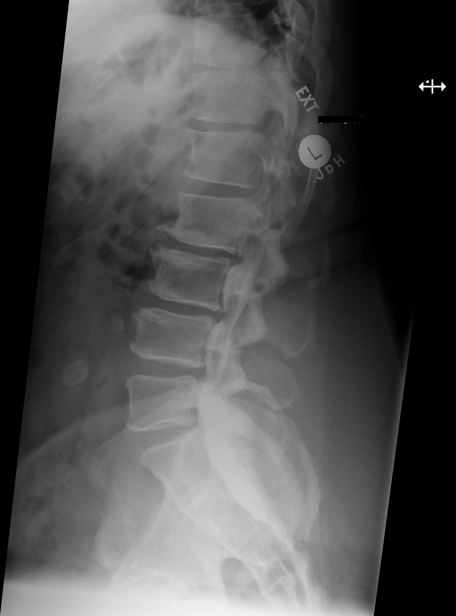

[Series 4: vasc adipose · 1 of 1 slices shown (4 of 11)]
[im 1/1]
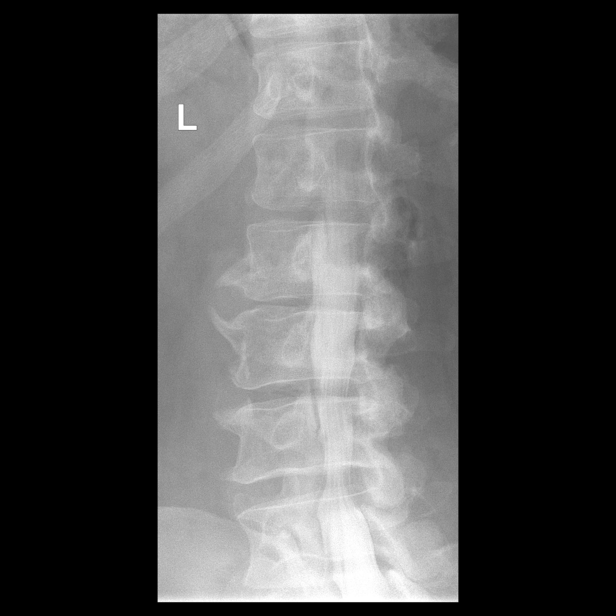

[Series 6: vasc adipose · 1 of 1 slices shown (5 of 11)]
[im 1/1]
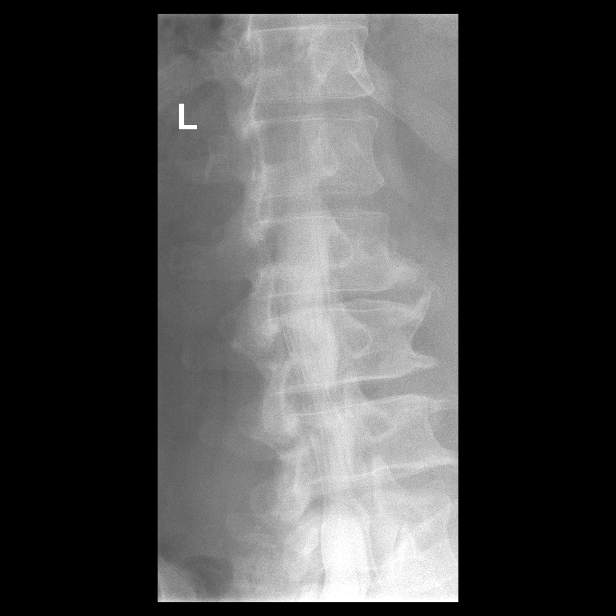

[Series 7: vasc adipose · 1 of 1 slices shown (6 of 11)]
[im 1/1]
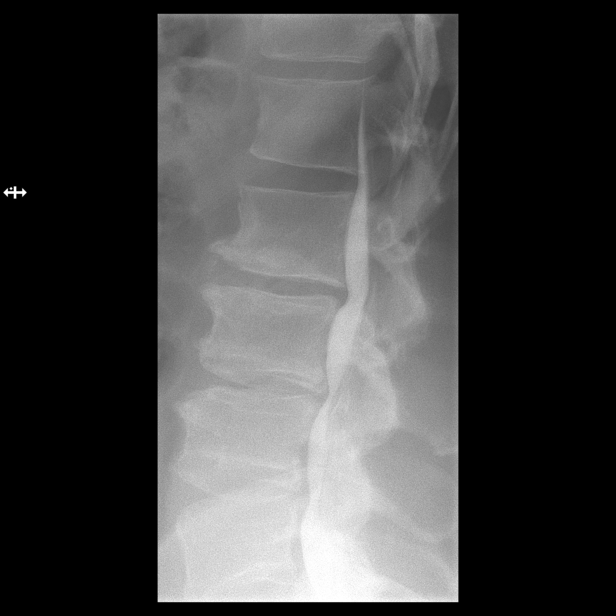

[Series 8: vasc adipose · 1 of 1 slices shown (7 of 11)]
[im 1/1]
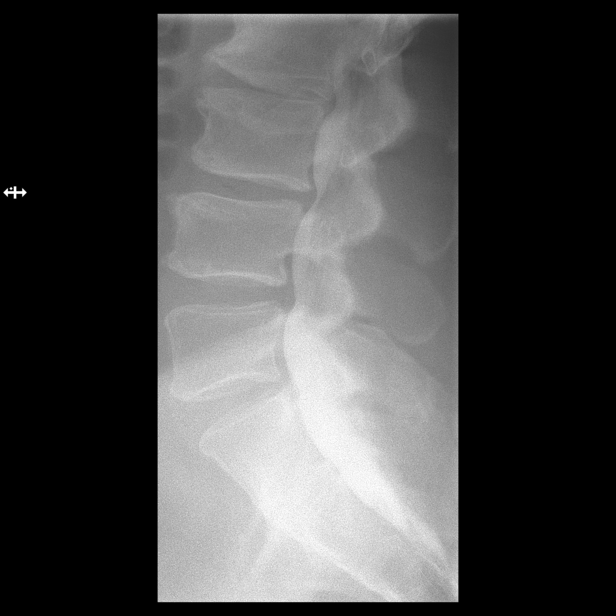

[Series 9: vasc adipose · 1 of 1 slices shown (8 of 11)]
[im 1/1]
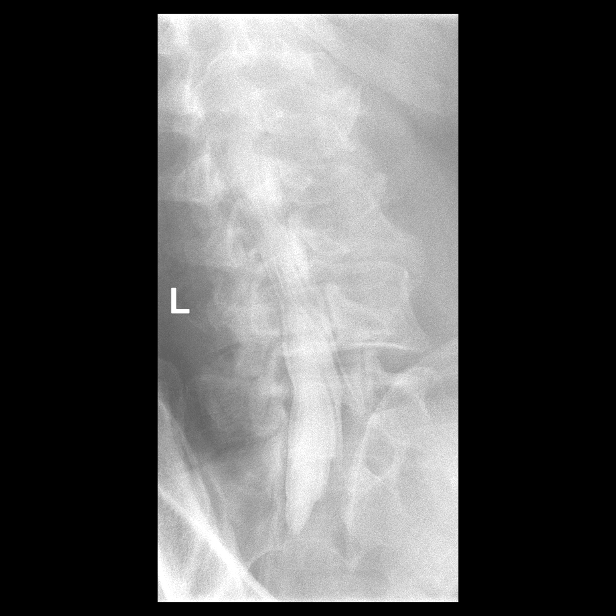

[Series 10: vasc adipose · 1 of 1 slices shown (9 of 11)]
[im 1/1]
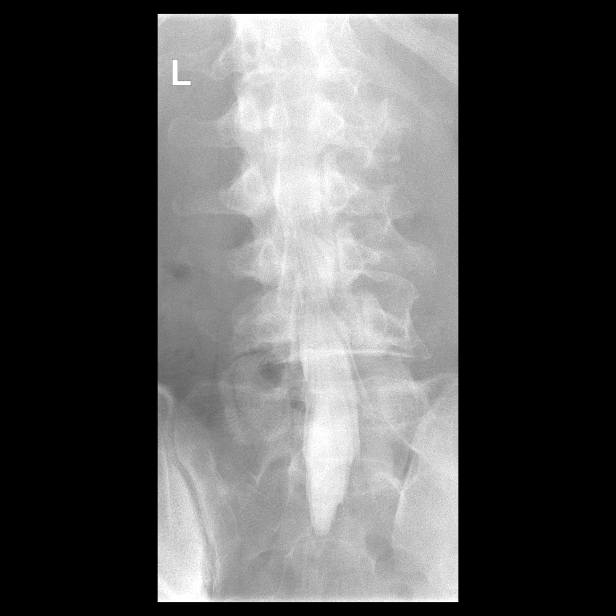

[Series 12: vasc adipose · 1 of 1 slices shown (10 of 11)]
[im 1/1]
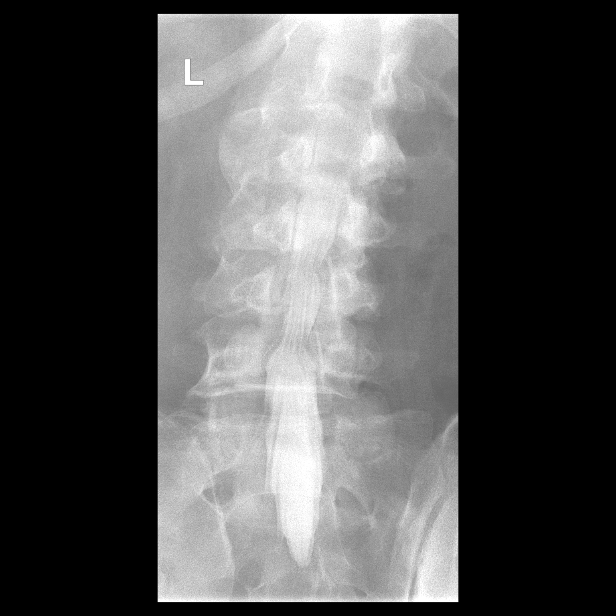

[Series 13: vasc adipose · 1 of 1 slices shown (11 of 11)]
[im 1/1]
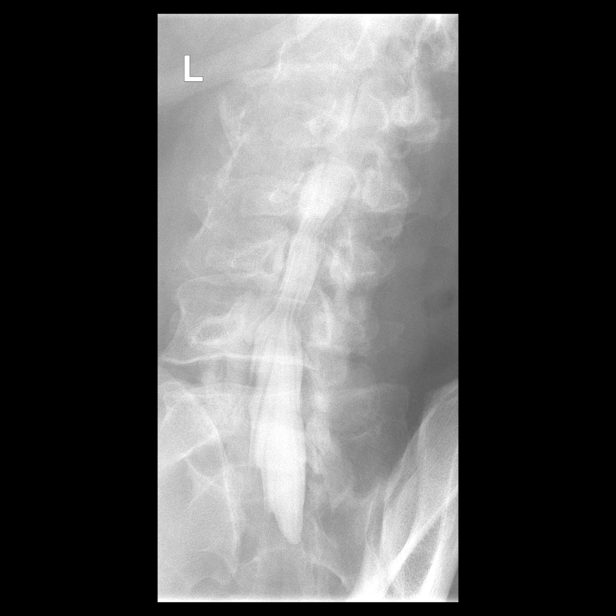

[13 of 16 positions shown; findings below may reference images not displayed]

EXAM:
LUMBAR MYELOGRAM

FLUOROSCOPY TIME:  Radiation Exposure Index (as provided by the
fluoroscopic device): 248.60 microGray*m^2

Fluoroscopy Time (in minutes and seconds):  12 seconds

PROCEDURE:
After thorough discussion of risks and benefits of the procedure
including bleeding, infection, injury to nerves, blood vessels,
adjacent structures as well as headache and CSF leak, written and
oral informed consent was obtained. Consent was obtained by Dr.
Ogbadu Confy. Time out form was completed.

Patient was positioned prone on the fluoroscopy table. Local
anesthesia was provided with 1% lidocaine without epinephrine after
prepped and draped in the usual sterile fashion. Puncture was
performed at L4-5 using a 5 inch 22-gauge spinal needle via a right
interlaminar approach. Using a single pass through the dura, the
needle was placed within the thecal sac, with return of clear CSF.
15 mL of Omnipaque 180 was injected into the thecal sac, with normal
opacification of the nerve roots and cauda equina consistent with
free flow within the subarachnoid space.

I personally performed the lumbar puncture and administered the
intrathecal contrast. I also personally supervised acquisition of
the myelogram images.
FINDINGS: LUMBAR MYELOGRAM FINDINGS:

There is transitional lumbosacral anatomy. Comparison studies
demonstrate that the ribs at T12 are small. Below this, there are 4
non rib-bearing lumbar type vertebrae followed by a transitional
segment which will be considered a largely sacralized L5 with
rudimentary L5-S1 disc.

Vertebral alignment is near anatomic without evidence of abnormal
motion on upright flexion or extension radiographs. Ventral
extradural defects at L2-3 and L3-4 are more prominent with standing
and result in mild-to-moderate spinal stenosis, greater at L3-4.
There is evidence of lateral recess stenosis on the right at L2-3
and bilaterally at L3-4. A ventral extradural defect at L1-2 does
not result in significant stenosis.

CT LUMBAR MYELOGRAM FINDINGS:

There is trace retrolisthesis of L1 on L2 and L2 on L3, and there is
slight left convex curvature of the lumbar spine. No fracture or
suspicious osseous lesion is identified. Disc space narrowing is
moderate at L1-2 and mild at L2-3 with vacuum disc at both levels.
Prominent anterior vertebral osteophytes are present from L1-L3. The
conus medullaris terminates at T12-L1. Abdominal aortic
atherosclerosis is noted without aneurysm.

T11-12: Minimal facet arthrosis without disc herniation or stenosis.

T12-L1: Negative.

L1-2: Circumferential disc bulging and mild facet and ligamentum
flavum hypertrophy result in borderline to mild lateral recess and
neural foraminal stenosis bilaterally. No spinal stenosis.

L2-3: Circumferential disc bulging, endplate spurring, congenitally
short pedicles, and moderate facet and ligamentum flavum hypertrophy
result in mild spinal stenosis, mild right lateral recess stenosis,
and moderate left greater than right neural foraminal stenosis.

L3-4: Circumferential disc bulging, congenitally short pedicles, and
moderate facet and ligamentum flavum hypertrophy result in mild
spinal stenosis, mild-to-moderate bilateral lateral recess stenosis,
and moderate bilateral neural foraminal stenosis.

L4-5: Circumferential disc bulging and moderate right and severe
left facet hypertrophy result in moderate bilateral neural foraminal
stenosis without spinal stenosis.

L5-S1: Rudimentary disc.  No stenosis.
IMPRESSION: 1. Transitional lumbosacral anatomy.
2. Mild-to-moderate multifactorial spinal stenosis at L3-4 greater
than L2-3, worse with standing.
3. Moderate bilateral neural foraminal stenosis at L2-3, L3-4, and
L4-5.
4.  Aortic Atherosclerosis (8CFS1-NI3.3).

## 2020-09-29 ENCOUNTER — Telehealth: Payer: Self-pay | Admitting: *Deleted

## 2020-09-29 MED ORDER — CLOPIDOGREL BISULFATE 75 MG PO TABS: ORAL_TABLET | ORAL | 0 refills | Status: DC

## 2020-09-29 NOTE — Telephone Encounter (Signed)
Pt contacted pre-CTO scheduled at [facility] for: Wednesday September 30, 2020 8:30 AM Verified arrival time and place: [facility]) at: 6:30 AM   No solid food after midnight prior to cath, clear liquids until 5 AM day of procedure.  Hold: Metformin-day of procedure and 48 hours post procedure Insulin-1/2 usual HS dose prior to procedure [place]is-AM of procedure-GFR 50  Except hold medications AM meds can be  taken pre-cath with sips of water including: ASA 81 mg Plavix 75 mg  Confirmed patient has responsible adult to drive home post procedure and be with patient first 24 hours after arriving home: yes  You are allowed ONE visitor in the waiting room during the time you are at the hospital for your procedure. Both you and your visitor must wear a mask once you enter the hospital.   Reviewed procedure/mask/visitor instructions with patient's wife (pt request review with his wife).  Copied from 09/29/20 staff message Per Dr Martinique:  I would give him Plavix 600 mg load today then 75 mg in am   I reviewed these instructions for taking Plavix with patient's wife.

## 2020-09-30 ENCOUNTER — Ambulatory Visit (HOSPITAL_COMMUNITY)
Admission: RE | Admit: 2020-09-30 | Discharge: 2020-09-30 | Disposition: A | Discharge status: Home / Self Care | Payer: Medicare Other | Attending: Cardiology | Admitting: Cardiology

## 2020-09-30 ENCOUNTER — Other Ambulatory Visit: Payer: Self-pay

## 2020-09-30 ENCOUNTER — Ambulatory Visit (HOSPITAL_COMMUNITY)
Admission: RE | Disposition: A | Discharge status: Home / Self Care | Payer: Self-pay | Source: Home / Self Care | Attending: Cardiology

## 2020-09-30 DIAGNOSIS — I10 Essential (primary) hypertension: Secondary | ICD-10-CM | POA: Diagnosis not present

## 2020-09-30 DIAGNOSIS — Z87891 Personal history of nicotine dependence: Secondary | ICD-10-CM | POA: Diagnosis not present

## 2020-09-30 DIAGNOSIS — I2510 Atherosclerotic heart disease of native coronary artery without angina pectoris: Secondary | ICD-10-CM | POA: Diagnosis present

## 2020-09-30 DIAGNOSIS — E119 Type 2 diabetes mellitus without complications: Secondary | ICD-10-CM | POA: Diagnosis not present

## 2020-09-30 DIAGNOSIS — I209 Angina pectoris, unspecified: Secondary | ICD-10-CM

## 2020-09-30 DIAGNOSIS — Z794 Long term (current) use of insulin: Secondary | ICD-10-CM | POA: Diagnosis not present

## 2020-09-30 DIAGNOSIS — Z7982 Long term (current) use of aspirin: Secondary | ICD-10-CM | POA: Insufficient documentation

## 2020-09-30 DIAGNOSIS — Z79899 Other long term (current) drug therapy: Secondary | ICD-10-CM | POA: Insufficient documentation

## 2020-09-30 DIAGNOSIS — I25119 Atherosclerotic heart disease of native coronary artery with unspecified angina pectoris: Secondary | ICD-10-CM

## 2020-09-30 DIAGNOSIS — E785 Hyperlipidemia, unspecified: Secondary | ICD-10-CM | POA: Diagnosis not present

## 2020-09-30 DIAGNOSIS — I2582 Chronic total occlusion of coronary artery: Secondary | ICD-10-CM | POA: Diagnosis not present

## 2020-09-30 DIAGNOSIS — Z7984 Long term (current) use of oral hypoglycemic drugs: Secondary | ICD-10-CM | POA: Diagnosis not present

## 2020-09-30 HISTORY — PX: CORONARY ANGIOGRAPHY: CATH118303

## 2020-09-30 HISTORY — PX: INTRAVASCULAR PRESSURE WIRE/FFR STUDY: CATH118243

## 2020-09-30 LAB — GLUCOSE, CAPILLARY
Glucose-Capillary: 69 mg/dL — ABNORMAL LOW (ref 70–99)
Glucose-Capillary: 89 mg/dL (ref 70–99)
Glucose-Capillary: 54 mg/dL — ABNORMAL LOW (ref 70–99)
Glucose-Capillary: 178 mg/dL — ABNORMAL HIGH (ref 70–99)

## 2020-09-30 LAB — POCT ACTIVATED CLOTTING TIME: Activated Clotting Time: 345 seconds

## 2020-09-30 SURGERY — CORONARY ANGIOGRAPHY (CATH LAB)
Anesthesia: LOCAL

## 2020-09-30 MED ORDER — HEPARIN (PORCINE) IN NACL 1000-0.9 UT/500ML-% IV SOLN
INTRAVENOUS | Status: AC
  Filled 2020-09-30: qty 500

## 2020-09-30 MED ORDER — MIDAZOLAM HCL 2 MG/2ML IJ SOLN
INTRAMUSCULAR | Status: AC
  Filled 2020-09-30: qty 2

## 2020-09-30 MED ORDER — ACETAMINOPHEN 325 MG PO TABS: 650.0000 mg | ORAL_TABLET | ORAL | Status: DC | PRN

## 2020-09-30 MED ORDER — SODIUM CHLORIDE 0.9% FLUSH: 3.0000 mL | Freq: Two times a day (BID) | INTRAVENOUS | Status: DC

## 2020-09-30 MED ORDER — SODIUM CHLORIDE 0.9 % IV SOLN: 250.0000 mL | INTRAVENOUS | Status: DC | PRN

## 2020-09-30 MED ORDER — ASPIRIN 81 MG PO CHEW: 81.0000 mg | CHEWABLE_TABLET | ORAL | Status: DC

## 2020-09-30 MED ORDER — VERAPAMIL HCL 2.5 MG/ML IV SOLN
INTRAVENOUS | Status: AC
  Filled 2020-09-30: qty 2

## 2020-09-30 MED ORDER — SODIUM CHLORIDE 0.9 % WEIGHT BASED INFUSION: 1.0000 mL/kg/h | INTRAVENOUS | Status: DC

## 2020-09-30 MED ORDER — NITROGLYCERIN 1 MG/10 ML FOR IR/CATH LAB
INTRA_ARTERIAL | Status: AC
  Filled 2020-09-30: qty 10

## 2020-09-30 MED ORDER — SODIUM CHLORIDE 0.9% FLUSH: 3.0000 mL | INTRAVENOUS | Status: DC | PRN

## 2020-09-30 MED ORDER — SODIUM CHLORIDE 0.9 % WEIGHT BASED INFUSION
3.0000 mL/kg/h | INTRAVENOUS | Status: AC
  Administered 2020-09-30: 3 mL/kg/h via INTRAVENOUS

## 2020-09-30 MED ORDER — MIDAZOLAM HCL 2 MG/2ML IJ SOLN
INTRAMUSCULAR | Status: DC | PRN
  Administered 2020-09-30: 1 mg via INTRAVENOUS

## 2020-09-30 MED ORDER — DEXTROSE 50 % IV SOLN
25.0000 mL | Freq: Once | INTRAVENOUS | Status: AC
  Administered 2020-09-30: 25 mL via INTRAVENOUS

## 2020-09-30 MED ORDER — ONDANSETRON HCL 4 MG/2ML IJ SOLN: 4.0000 mg | Freq: Four times a day (QID) | INTRAMUSCULAR | Status: DC | PRN

## 2020-09-30 MED ORDER — FENTANYL CITRATE (PF) 100 MCG/2ML IJ SOLN
INTRAMUSCULAR | Status: DC | PRN
  Administered 2020-09-30: 25 ug via INTRAVENOUS

## 2020-09-30 MED ORDER — FENTANYL CITRATE (PF) 100 MCG/2ML IJ SOLN
INTRAMUSCULAR | Status: AC
  Filled 2020-09-30: qty 2

## 2020-09-30 MED ORDER — HEPARIN SODIUM (PORCINE) 1000 UNIT/ML IJ SOLN
INTRAMUSCULAR | Status: DC | PRN
  Administered 2020-09-30: 10000 [IU] via INTRAVENOUS

## 2020-09-30 MED ORDER — DEXTROSE 50 % IV SOLN
INTRAVENOUS | Status: AC
  Filled 2020-09-30: qty 50

## 2020-09-30 MED ORDER — METFORMIN HCL ER 500 MG PO TB24: 1000.0000 mg | ORAL_TABLET | Freq: Two times a day (BID) | ORAL | Status: AC

## 2020-09-30 MED ORDER — SODIUM CHLORIDE 0.9 % WEIGHT BASED INFUSION: 1.0000 mL/kg/h | INTRAVENOUS | Status: AC

## 2020-09-30 MED ORDER — LIDOCAINE HCL (PF) 1 % IJ SOLN
INTRAMUSCULAR | Status: AC
  Filled 2020-09-30: qty 30

## 2020-09-30 MED ORDER — VERAPAMIL HCL 2.5 MG/ML IV SOLN
INTRAVENOUS | Status: DC | PRN
  Administered 2020-09-30: 10 mL via INTRA_ARTERIAL

## 2020-09-30 MED ORDER — HEPARIN SODIUM (PORCINE) 1000 UNIT/ML IJ SOLN
INTRAMUSCULAR | Status: AC
  Filled 2020-09-30: qty 1

## 2020-09-30 MED ORDER — IOHEXOL 350 MG/ML SOLN
INTRAVENOUS | Status: DC | PRN
  Administered 2020-09-30: 60 mL

## 2020-09-30 MED ORDER — LIDOCAINE HCL (PF) 1 % IJ SOLN
INTRAMUSCULAR | Status: DC | PRN
  Administered 2020-09-30: 2 mL

## 2020-09-30 MED ORDER — HEPARIN (PORCINE) IN NACL 1000-0.9 UT/500ML-% IV SOLN
INTRAVENOUS | Status: DC | PRN
  Administered 2020-09-30 (×2): 500 mL

## 2020-09-30 SURGICAL SUPPLY — 11 items
CATH VISTA GUIDE 6FR XBLAD3.5 (CATHETERS) ×1 IMPLANT
DEVICE RAD COMP TR BAND LRG (VASCULAR PRODUCTS) ×1 IMPLANT
GLIDESHEATH SLEND SS 6F .021 (SHEATH) ×1 IMPLANT
GUIDEWIRE INQWIRE 1.5J.035X260 (WIRE) IMPLANT
GUIDEWIRE PRESSURE COMET II (WIRE) ×1 IMPLANT
INQWIRE 1.5J .035X260CM (WIRE) ×4
KIT ESSENTIALS PG (KITS) ×1 IMPLANT
KIT HEART LEFT (KITS) ×2 IMPLANT
PACK CARDIAC CATHETERIZATION (CUSTOM PROCEDURE TRAY) ×2 IMPLANT
TRANSDUCER W/STOPCOCK (MISCELLANEOUS) ×2 IMPLANT
TUBING CIL FLEX 10 FLL-RA (TUBING) ×2 IMPLANT

## 2020-09-30 NOTE — Interval H&P Note (Signed)
History and Physical Interval Note:  09/30/2020 7:38 AM  Mark Elliott  has presented today for surgery, with the diagnosis of CAD.  The various methods of treatment have been discussed with the patient and family. After consideration of risks, benefits and other options for treatment, the patient has consented to  Procedure(s): CORONARY CTO INTERVENTION (N/A) as a surgical intervention.  The patient's history has been reviewed, patient examined, no change in status, stable for surgery.  I have reviewed the patient's chart and labs.  Questions were answered to the patient's satisfaction.    Cath Lab Visit (complete for each Cath Lab visit)  Clinical Evaluation Leading to the Procedure:   ACS: No.  Non-ACS:    Anginal Classification: CCS III  Anti-ischemic medical therapy: Maximal Therapy (2 or more classes of medications)  Non-Invasive Test Results: Intermediate-risk stress test findings: cardiac mortality 1-3%/year  Prior CABG: No previous CABG       Mark Elliott [facility] 09/30/2020 7:38 AM

## 2020-09-30 NOTE — Discharge Instructions (Addendum)
Stop taking Plavix HOLD METFORMIN FOR A FULL 48 HOURS AFTER DISCHARGE.  Radial Site Care  This sheet gives you information about how to care for yourself after your procedure. Your health care provider may also give you more specific instructions. If you have problems or questions, contact your health care provider. What can I expect after the procedure? After the procedure, it is common to have:  Bruising and tenderness at the catheter insertion area. Follow these instructions at home: Medicines  Take over-the-counter and prescription medicines only as told by your health care provider. Insertion site care  Follow instructions from your health care provider about how to take care of your insertion site. Make sure you: ? Wash your hands with soap and water before you change your bandage (dressing). If soap and water are not available, use hand sanitizer. ? Change your dressing as told by your health care provider. ? Leave stitches (sutures), skin glue, or adhesive strips in place. These skin closures may need to stay in place for 2 weeks or longer. If adhesive strip edges start to loosen and curl up, you may trim the loose edges. Do not remove adhesive strips completely unless your health care provider tells you to do that.  Check your insertion site every day for signs of infection. Check for: ? Redness, swelling, or pain. ? Fluid or blood. ? Pus or a bad smell. ? Warmth.  Do not take baths, swim, or use a hot tub until your health care provider approves.  You may shower 24-48 hours after the procedure, or as directed by your health care provider. ? Remove the dressing and gently wash the site with plain soap and water. ? Pat the area dry with a clean towel. ? Do not rub the site. That could cause bleeding.  Do not apply powder or lotion to the site. Activity  For 24 hours after the procedure, or as directed by your health care provider: ? Do not flex or bend the affected  arm. ? Do not push or pull heavy objects with the affected arm. ? Do not drive yourself home from the hospital or clinic. You may drive 24 hours after the procedure unless your health care provider tells you not to. ? Do not operate machinery or power tools.  Do not lift anything that is heavier than 10 lb (4.5 kg), or the limit that you are told, until your health care provider says that it is safe.  Ask your health care provider when it is okay to: ? Return to work or school. ? Resume usual physical activities or sports. ? Resume sexual activity.   General instructions  If the catheter site starts to bleed, raise your arm and put firm pressure on the site. If the bleeding does not stop, get help right away. This is a medical emergency.  If you went home on the same day as your procedure, a responsible adult should be with you for the first 24 hours after you arrive home.  Keep all follow-up visits as told by your health care provider. This is important. Contact a health care provider if:  You have a fever.  You have redness, swelling, or yellow drainage around your insertion site. Get help right away if:  You have unusual pain at the radial site.  The catheter insertion area swells very fast.  The insertion area is bleeding, and the bleeding does not stop when you hold steady pressure on the area.  Your arm or  hand becomes pale, cool, tingly, or numb. These symptoms may represent a serious problem that is an emergency. Do not wait to see if the symptoms will go away. Get medical help right away. Call your local emergency services (911 in the [place] Do not drive yourself to the hospital. Summary  After the procedure, it is common to have bruising and tenderness at the site.  Follow instructions from your health care provider about how to take care of your radial site wound. Check the wound every day for signs of infection.  Do not lift anything that is heavier than 10 lb (4.5  kg), or the limit that you are told, until your health care provider says that it is safe. This information is not intended to replace advice given to you by your health care provider. Make sure you discuss any questions you have with your health care provider. Document Revised: 08/02/2017 Document Reviewed: 08/02/2017 Elsevier Patient Education  2021 [employer].

## 2020-10-01 ENCOUNTER — Telehealth: Payer: Self-pay

## 2020-10-01 ENCOUNTER — Other Ambulatory Visit: Payer: Self-pay | Admitting: Student

## 2020-10-01 ENCOUNTER — Encounter (HOSPITAL_COMMUNITY): Payer: Self-pay | Admitting: Cardiology

## 2020-10-01 DIAGNOSIS — I25118 Atherosclerotic heart disease of native coronary artery with other forms of angina pectoris: Secondary | ICD-10-CM

## 2020-10-01 MED FILL — Nitroglycerin IV Soln 100 MCG/ML in D5W: INTRA_ARTERIAL | Qty: 10 | Status: AC

## 2020-10-01 NOTE — Telephone Encounter (Signed)
Patient wife called and is requesting you give her a call. She has some questions/issues and concerns, that she says need to be addressed.    Erasmo Score : [id number]

## 2020-10-06 ENCOUNTER — Other Ambulatory Visit: Payer: Self-pay

## 2020-10-06 ENCOUNTER — Institutional Professional Consult (permissible substitution): Payer: Medicare Other | Admitting: Cardiothoracic Surgery

## 2020-10-06 ENCOUNTER — Encounter: Payer: Self-pay | Admitting: Cardiothoracic Surgery

## 2020-10-06 VITALS — BP 151/65 | HR 52 | Temp 97.9°F | Resp 20 | Ht 66.0 in | Wt 223.0 lb

## 2020-10-06 DIAGNOSIS — I25118 Atherosclerotic heart disease of native coronary artery with other forms of angina pectoris: Secondary | ICD-10-CM

## 2020-10-06 NOTE — Progress Notes (Signed)
[address]       [place] [address]             [phone]     CARDIOTHORACIC SURGERY CONSULTATION REPORT  Referring Provider is Alethia Berthold, [place]* Primary Cardiologist is No primary care provider on file. PCP is Seward Carol, MD  Chief Complaint  Patient presents with  . Coronary Artery Disease    New patient consultation, CATH 09/30/20, ECHO 07/14/20     HPI:  85 year old man is referred for consideration of CABG.  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  For the past several months he has noted increased shortness of breath and fatigue with exertion.  He denies chest pain as such.  Patient underwent left heart catheterization by Dr. Einar Gip in February which demonstrated totally occluded circumflex vessels.  He also had some disease of the right coronary artery.  He was referred for PCI which was attempted last week but this demonstrated in the interim severe proximal LAD disease.  The procedure was aborted and the patient is referred for CABG.  He denies history of stroke or dysrhythmias. Past Medical History:  Diagnosis Date  . Cancer ([place])   . Diabetes mellitus without complication ([place])   . Hyperlipidemia     Past Surgical History:  Procedure Laterality Date  . ANTERIOR FUSION CERVICAL SPINE    . CORONARY ANGIOGRAPHY N/A 09/30/2020   Procedure: CORONARY ANGIOGRAPHY;  Surgeon: Martinique, Peter M, MD;  Location: [facility];  Service: Cardiovascular;  Laterality: N/A;  . HERNIA REPAIR    . INTRAVASCULAR PRESSURE WIRE/FFR STUDY N/A 09/30/2020   Procedure: INTRAVASCULAR PRESSURE WIRE/FFR STUDY;  Surgeon: Martinique, Peter M, MD;  Location: [facility];  Service: Cardiovascular;  Laterality: N/A;  . LEFT HEART CATH AND CORONARY ANGIOGRAPHY N/A 11/15/2016   Procedure: Left Heart Cath and Coronary Angiography;  Surgeon: [name], MD;  Location: [facility];  Service: Cardiovascular;  Laterality: N/A;  . LEFT HEART  CATH AND CORONARY ANGIOGRAPHY N/A 08/18/2020   Procedure: LEFT HEART CATH AND CORONARY ANGIOGRAPHY;  Surgeon: [name], MD;  Location: [facility];  Service: Cardiovascular;  Laterality: N/A;    Family History  Problem Relation Age of Onset  . Cancer Father        liver  . Cancer Brother        pancreatic    Social History   Socioeconomic History  . Marital status: Married    Spouse name: Not on file  . Number of children: Not on file  . Years of education: Not on file  . Highest education level: Not on file  Occupational History  . Not on file  Tobacco Use  . Smoking status: Former Smoker    Quit date: 02/13/1974    Years since quitting: 46.6  . Smokeless tobacco: Never Used  Substance and Sexual Activity  . Alcohol use: No  . Drug use: No  . Sexual activity: Not on file  Other Topics Concern  . Not on file  Social History Narrative  . Not on file   Social Determinants of Health   Financial Resource Strain: Not on file  Food Insecurity: Not on file  Transportation Needs: Not on file  Physical Activity: Not on file  Stress: Not on file  Social Connections: Not on file  Intimate Partner Violence: Not on file    Current Outpatient Medications  Medication  Sig Dispense Refill  . ACCU-CHEK AVIVA PLUS test strip Inject 1 application as directed 2 (two) times daily.    Marland Kitchen amLODipine (NORVASC) 5 MG tablet Take 5 mg by mouth daily.    Marland Kitchen aspirin 81 MG tablet Take 81 mg by mouth every evening.     . isosorbide mononitrate (IMDUR) 60 MG 24 hr tablet TAKE ONE TABLET BY MOUTH DAILY (Patient taking differently: Take 60 mg by mouth daily.) 90 tablet 1  . LANTUS SOLOSTAR 100 UNIT/ML Solostar Pen Inject 16 Units into the skin daily.    . metFORMIN (GLUCOPHAGE-XR) 500 MG 24 hr tablet Take 2 tablets (1,000 mg total) by mouth 2 (two) times daily.    . metoprolol succinate (TOPROL-XL) 50 MG 24 hr tablet TAKE ONE TABLET BY MOUTH DAILY (Patient taking differently: Take 50 mg by  mouth daily.) 90 tablet 3  . nitroGLYCERIN (NITROSTAT) 0.4 MG SL tablet Place 1 tablet (0.4 mg total) under the tongue every 5 (five) minutes as needed for chest pain. 25 tablet 3  . pravastatin (PRAVACHOL) 80 MG tablet Take 1 tablet (80 mg total) by mouth every evening. 90 tablet 3  . ranolazine (RANEXA) 500 MG 12 hr tablet Take 500 mg by mouth 2 (two) times daily.    Marland Kitchen telmisartan (MICARDIS) 80 MG tablet Take 80 mg by mouth daily.     Current Facility-Administered Medications  Medication Dose Route Frequency Provider Last Rate Last Admin  . sodium chloride flush (NS) 0.9 % injection 3 mL  3 mL Intravenous Q12H Martinique, Peter M, MD        No Known Allergies    Review of Systems:   General:  Reduced energy level particularly with exertion and slight weight gain  Cardiac:  Per HPI  Respiratory:  Dyspnea on exertion  GI:   Occasional heartburn  GU:   History of hematuria  Vascular:  History of varicose veins  Neuro:   No history of stroke or TIAs  Musculoskeletal: Some muscle pain and difficulty walking  Skin:   Negative  Psych:   Mild anxiety and depression  Eyes:   Does have history of floaters  ENT:    last saw dentist 6 months prior  Hematologic:  Negative  Endocrine:  Not diabetic     Physical Exam:   BP (!) 151/65 (BP Location: Left Arm, Patient Position: Sitting, Cuff Size: Normal)   Pulse (!) 52   Temp 97.9 F (36.6 C) (Skin)   Resp 20   Ht 5\' 6"  (1.676 m)   Wt 101.2 kg   SpO2 97% Comment: RA  BMI 35.99 kg/m   General:    well-appearing  HEENT:  Unremarkable   Neck:   no JVD, no bruits, no adenopathy   Chest:   clear to auscultation, symmetrical breath sounds, no wheezes, no rhonchi   CV:   RRR, no detectable murmur   Abdomen:  soft, non-tender, no masses   Extremities:  warm, well-perfused, pulses intact throughout, no LE edema  Rectal/GU  Deferred  Neuro:   Grossly non-focal and symmetrical throughout  Skin:   Clean and dry, no rashes, no  breakdown   Diagnostic Tests:  I have personally reviewed his available cardiac catheterization films from February and March of this year.  I agree with the interpretation  Impression:  85 year old man with multivessel coronary artery disease.  This is not amenable to medical therapy or PCI.  However, he is a good candidate for surgery as he continues to  work and has a good functional capacity.   Plan:  Complete pre-CABG work-up Tentatively plan CABG on 10/12/2020 Report any severe chest pain or shortness of breath to local emergency department   I spent in excess of 30 minutes during the conduct of this office consultation and >50% of this time involved direct face-to-face encounter with the patient for counseling and/or coordination of their care.          Level 3 Office Consult = 40 minutes         Level 4 Office Consult = 60 minutes         Level 5 Office Consult = 80 minutes  B.  Murvin Natal, MD 10/06/2020 4:07 PM

## 2020-10-07 ENCOUNTER — Other Ambulatory Visit: Payer: Self-pay | Admitting: *Deleted

## 2020-10-07 DIAGNOSIS — I2510 Atherosclerotic heart disease of native coronary artery without angina pectoris: Secondary | ICD-10-CM

## 2020-10-08 ENCOUNTER — Encounter (HOSPITAL_COMMUNITY)
Admission: RE | Admit: 2020-10-08 | Discharge: 2020-10-08 | Disposition: A | Discharge status: Home / Self Care | Payer: Medicare Other | Source: Ambulatory Visit | Attending: Cardiothoracic Surgery | Admitting: Cardiothoracic Surgery

## 2020-10-08 ENCOUNTER — Other Ambulatory Visit (HOSPITAL_COMMUNITY)
Admission: RE | Admit: 2020-10-08 | Discharge: 2020-10-08 | Disposition: A | Discharge status: Home / Self Care | Payer: Medicare Other | Source: Ambulatory Visit | Attending: Cardiothoracic Surgery | Admitting: Cardiothoracic Surgery

## 2020-10-08 ENCOUNTER — Ambulatory Visit (HOSPITAL_COMMUNITY)
Admission: RE | Admit: 2020-10-08 | Discharge: 2020-10-08 | Disposition: A | Discharge status: Home / Self Care | Payer: Medicare Other | Source: Ambulatory Visit | Attending: Cardiothoracic Surgery | Admitting: Cardiothoracic Surgery

## 2020-10-08 ENCOUNTER — Encounter (HOSPITAL_COMMUNITY): Payer: Self-pay

## 2020-10-08 ENCOUNTER — Other Ambulatory Visit: Payer: Self-pay

## 2020-10-08 DIAGNOSIS — I2510 Atherosclerotic heart disease of native coronary artery without angina pectoris: Secondary | ICD-10-CM | POA: Diagnosis not present

## 2020-10-08 DIAGNOSIS — E119 Type 2 diabetes mellitus without complications: Secondary | ICD-10-CM | POA: Diagnosis not present

## 2020-10-08 DIAGNOSIS — Z01818 Encounter for other preprocedural examination: Secondary | ICD-10-CM | POA: Insufficient documentation

## 2020-10-08 DIAGNOSIS — Z20822 Contact with and (suspected) exposure to covid-19: Secondary | ICD-10-CM | POA: Insufficient documentation

## 2020-10-08 DIAGNOSIS — E785 Hyperlipidemia, unspecified: Secondary | ICD-10-CM | POA: Insufficient documentation

## 2020-10-08 DIAGNOSIS — Z87891 Personal history of nicotine dependence: Secondary | ICD-10-CM | POA: Diagnosis not present

## 2020-10-08 HISTORY — DX: Atherosclerotic heart disease of native coronary artery without angina pectoris: I25.10

## 2020-10-08 HISTORY — DX: Essential (primary) hypertension: I10

## 2020-10-08 HISTORY — DX: Dyspnea, unspecified: R06.00

## 2020-10-08 LAB — COMPREHENSIVE METABOLIC PANEL
ALT: 15 U/L (ref 0–44)
AST: 16 U/L (ref 15–41)
Albumin: 3.7 g/dL (ref 3.5–5.0)
Alkaline Phosphatase: 44 U/L (ref 38–126)
Anion gap: 9 (ref 5–15)
BUN: 22 mg/dL (ref 8–23)
CO2: 21 mmol/L — ABNORMAL LOW (ref 22–32)
Calcium: 9.5 mg/dL (ref 8.9–10.3)
Chloride: 103 mmol/L (ref 98–111)
Creatinine, Ser: 1.55 mg/dL — ABNORMAL HIGH (ref 0.61–1.24)
GFR, Estimated: 44 mL/min — ABNORMAL LOW (ref >60)
Glucose, Bld: 234 mg/dL — ABNORMAL HIGH (ref 70–99)
Potassium: 5.1 mmol/L (ref 3.5–5.1)
Sodium: 133 mmol/L — ABNORMAL LOW (ref 135–145)
Total Bilirubin: 0.8 mg/dL (ref 0.3–1.2)
Total Protein: 6.6 g/dL (ref 6.5–8.1)

## 2020-10-08 LAB — BLOOD GAS, ARTERIAL
Acid-base deficit: 1.5 mmol/L (ref 0.0–2.0)
Bicarbonate: 22.5 mmol/L (ref 20.0–28.0)
Drawn by: 602861
FIO2: 21
O2 Saturation: 98.6 %
Patient temperature: 37
pCO2 arterial: 37 mmHg (ref 32.0–48.0)
pH, Arterial: 7.401 (ref 7.350–7.450)
pO2, Arterial: 123 mmHg — ABNORMAL HIGH (ref 83.0–108.0)

## 2020-10-08 LAB — PROTIME-INR
INR: 1 (ref 0.8–1.2)
Prothrombin Time: 12.6 seconds (ref 11.4–15.2)

## 2020-10-08 LAB — URINALYSIS, ROUTINE W REFLEX MICROSCOPIC
Bacteria, UA: NONE SEEN
Bilirubin Urine: NEGATIVE
Glucose, UA: >=500 mg/dL — AB
Hgb urine dipstick: NEGATIVE
Ketones, ur: NEGATIVE mg/dL
Leukocytes,Ua: NEGATIVE
Nitrite: NEGATIVE
Protein, ur: NEGATIVE mg/dL
Specific Gravity, Urine: 1.018 (ref 1.005–1.030)
pH: 5 (ref 5.0–8.0)

## 2020-10-08 LAB — CBC
HCT: 37.7 % — ABNORMAL LOW (ref 39.0–52.0)
Hemoglobin: 12.5 g/dL — ABNORMAL LOW (ref 13.0–17.0)
MCH: 29.7 pg (ref 26.0–34.0)
MCHC: 33.2 g/dL (ref 30.0–36.0)
MCV: 89.5 fL (ref 80.0–100.0)
Platelets: 242 10*3/uL (ref 150–400)
RBC: 4.21 MIL/uL — ABNORMAL LOW (ref 4.22–5.81)
RDW: 12.7 % (ref 11.5–15.5)
WBC: 4 10*3/uL (ref 4.0–10.5)
nRBC: 0 % (ref 0.0–0.2)

## 2020-10-08 LAB — SURGICAL PCR SCREEN
MRSA, PCR: NEGATIVE
Staphylococcus aureus: NEGATIVE

## 2020-10-08 LAB — APTT: aPTT: 32 seconds (ref 24–36)

## 2020-10-08 LAB — HEMOGLOBIN A1C
Hgb A1c MFr Bld: 9 % — ABNORMAL HIGH (ref 4.8–5.6)
Mean Plasma Glucose: 211.6 mg/dL

## 2020-10-08 LAB — GLUCOSE, CAPILLARY: Glucose-Capillary: 219 mg/dL — ABNORMAL HIGH (ref 70–99)

## 2020-10-08 LAB — SARS CORONAVIRUS 2 (TAT 6-24 HRS): SARS Coronavirus 2: NEGATIVE

## 2020-10-08 NOTE — Progress Notes (Signed)
Pre-CABG vascular work-up completed    Please see CV Proc for preliminary results.   Mark Elliott, RVT

## 2020-10-08 NOTE — Progress Notes (Signed)
Mr.Pucci denies chest pain or shortness of breath. Patient was tested for Covid and  Will be in quarantine when patient leaves the hospital.  PCP - Dr. Delfina Redwood  Cardiologist - Dr [name]  Chest x-ray - 10/08/20  Cardiac Cath - 09/30/20  AICD-no PM-no LOOP-no  Sleep Study - no CPAP - no  LABS-CBC, CMP, PT, PTT, ABG, T/S, A1C, UA, PCR  ASA- 81 mg will continue until the morning of surgery  ERAS-no  HA1C- 10/08/20 Fasting Blood Sugar - 105-136 Checks Blood Sugar _____ times a day  Anesthesia-  Pt denies having chest pain, sob, or fever at this time. All instructions explained to the pt, with a verbal understanding of the material. Pt agrees to go over the instructions while at home for a better understanding. Pt also instructed to self quarantine after being tested for COVID-19. The opportunity to ask questions was provided.

## 2020-10-08 NOTE — Pre-Procedure Instructions (Addendum)
Mark Elliott  10/08/2020      Your procedure is scheduled on Monday, April 4.  Report to [facility], Main Entrance or Entrance "A" at 5:30 AM .                Your surgery or procedure is scheduled to begin at 7:30 AM  Call this number if you have problems the morning of surgery: [phone]  This is the number for the Pre- Surgical Desk.                For any other questions, please call [phone], Monday - Friday 8 AM - 4 PM.   Remember:  Do not eat or drink after midnight, Sunday night.   Take these medicines the morning of surgery with A SIP OF WATER : amLODipine (NORVASC) isosorbide mononitrate (IMDUR) metoprolol succinate (TOPROL-XL)  Follow your Dr.'s instructions regarding Aspirin.  STOP taking Aspirin Products (Goody Powder, Excedrin Migraine), Ibuprofen (Advil), Naproxen (Aleve), Vitamins and Herbal Products (ie Fish Oil). Lantus 8 units    WHAT DO I DO ABOUT MY DIABETES MEDICATION?  Marland Kitchen Do not take oral diabetes medicines (pills) the morning of surgery - Metformin.  . THE NIGHT BEFORE SURGERY, take  8  units of Lantus insulin.       How to Manage Your Diabetes Before and After Surgery  Why is it important to control my blood sugar before and after surgery? . Improving blood sugar levels before and after surgery helps healing and can limit problems. . A way of improving blood sugar control is eating a healthy diet by: o  Eating less sugar and carbohydrates o  Increasing activity/exercise o  Talking with your doctor about reaching your blood sugar goals . High blood sugars (greater than 180 mg/dL) can raise your risk of infections and slow your recovery, so you will need to focus on controlling your diabetes during the weeks before surgery. . Make sure that the doctor who takes care of your diabetes knows about your planned surgery including the date and location.  How do I manage my blood sugar before surgery? . Check your blood sugar at  least 4 times a day, starting 2 days before surgery, to make sure that the level is not too high or low. o Check your blood sugar the morning of your surgery when you wake up and every 2 hours until you get to the Short Stay unit. . If your blood sugar is less than 70 mg/dL, you will need to treat for low blood sugar: o Do not take insulin. o Treat a low blood sugar (less than 70 mg/dL) with  cup of clear juice (cranberry or apple), 4 glucose tablets, OR glucose gel. Recheck blood sugar in 15 minutes after treatment (to make sure it is greater than 70 mg/dL). If your blood sugar is not greater than 70 mg/dL on recheck, call [phone] o  for further instructions. . Report your blood sugar to the short stay nurse when you get to Short Stay.  . If you are admitted to the hospital after surgery: o Your blood sugar will be checked by the staff and you will probably be given insulin after surgery (instead of oral diabetes medicines) to make sure you have good blood sugar levels. o The goal for blood sugar control after surgery is 80-180 mg/dL.  [facility]- Preparing For Surgery  Before surgery, you can play an important role. Because skin is not sterile, your  skin needs to be as free of germs as possible. You can reduce the number of germs on your skin by washing with CHG (chlorahexidine gluconate) Soap before surgery.  CHG is an antiseptic cleaner which kills germs and bonds with the skin to continue killing germs even after washing.    Oral Hygiene is also important to reduce your risk of infection.  Remember - BRUSH YOUR TEETH THE MORNING OF SURGERY WITH YOUR REGULAR TOOTHPASTE  Please do not use if you have an allergy to CHG or antibacterial soaps. If your skin becomes reddened/irritated stop using the CHG.  Do not shave (including legs and underarms) for at least 48 hours prior to first CHG shower. It is OK to shave your face.  Please follow these instructions carefully.   1. Shower the  NIGHT BEFORE SURGERY and the MORNING OF SURGERY with CHG.   2. If you chose to wash your hair, wash your hair first as usual with your normal shampoo.  3. After you shampoo, wash your face and private area with the soap you use at home, then rinse your hair and body thoroughly to remove the shampoo and soap.  4. Use CHG as you would any other liquid soap. You can apply CHG directly to the skin and wash gently with a scrungie or a clean washcloth.   5. Apply the CHG Soap to your body ONLY FROM THE NECK DOWN.  Do not use on open wounds or open sores. Avoid contact with your eyes, ears, mouth and genitals (private parts).   6. Wash thoroughly, paying special attention to the area where your surgery will be performed.  7. Thoroughly rinse your body with warm water from the neck down.  8. DO NOT shower/wash with your normal soap after using and rinsing off the CHG Soap.  9. Pat yourself dry with a CLEAN TOWEL.  10. Wear CLEAN PAJAMAS to bed the night before surgery, wear comfortable clothes the morning of surgery  11. Place CLEAN SHEETS on your bed the night of your first shower and DO NOT SLEEP WITH PETS.  Day of Surgery: Shower as instructed above. Do not apply any deodorants/lotions, powders or colognes.  Please wear clean clothes to the hospital/surgery center.   Remember to brush your teeth WITH YOUR REGULAR TOOTHPASTE.  Do not wear jewelry, make-up or nail polish.  Do not shave 48 hours prior to surgery.  Men may shave face and neck.  Do not bring valuables to the hospital.  [facility] is not responsible for any belongings or valuables.  Contacts, dentures or bridgework may not be worn into surgery.  Leave your suitcase in the car.  After surgery it may be brought to your room.  For patients admitted to the hospital, discharge time will be determined by your treatment team.  Patients discharged the day of surgery will not be allowed to drive home.   Please read over the fact  sheets that you were given.

## 2020-10-09 ENCOUNTER — Encounter (HOSPITAL_COMMUNITY): Payer: Self-pay

## 2020-10-09 MED ORDER — TRANEXAMIC ACID 1000 MG/10ML IV SOLN
1.5000 mg/kg/h | INTRAVENOUS | Status: AC
  Administered 2020-10-12: 1.5 mg/kg/h via INTRAVENOUS
  Filled 2020-10-09: qty 25

## 2020-10-09 MED ORDER — TRANEXAMIC ACID (OHS) BOLUS VIA INFUSION
15.0000 mg/kg | INTRAVENOUS | Status: AC
  Administered 2020-10-12: 1536 mg via INTRAVENOUS
  Filled 2020-10-09: qty 1536

## 2020-10-09 MED ORDER — NOREPINEPHRINE 4 MG/250ML-% IV SOLN
0.0000 ug/min | INTRAVENOUS | Status: DC
  Filled 2020-10-09: qty 250

## 2020-10-09 MED ORDER — SODIUM CHLORIDE 0.9 % IV SOLN
INTRAVENOUS | Status: DC
  Filled 2020-10-09: qty 30

## 2020-10-09 MED ORDER — SODIUM CHLORIDE 0.9 % IV SOLN
750.0000 mg | INTRAVENOUS | Status: AC
  Administered 2020-10-12: 750 mg via INTRAVENOUS
  Filled 2020-10-09: qty 750

## 2020-10-09 MED ORDER — PHENYLEPHRINE HCL-NACL 20-0.9 MG/250ML-% IV SOLN
30.0000 ug/min | INTRAVENOUS | Status: AC
  Administered 2020-10-12: 20 ug/min via INTRAVENOUS
  Filled 2020-10-09: qty 250

## 2020-10-09 MED ORDER — DEXMEDETOMIDINE HCL IN NACL 400 MCG/100ML IV SOLN
0.1000 ug/kg/h | INTRAVENOUS | Status: AC
  Administered 2020-10-12: .3 ug/kg/h via INTRAVENOUS
  Filled 2020-10-09: qty 100

## 2020-10-09 MED ORDER — EPINEPHRINE HCL 5 MG/250ML IV SOLN IN NS
0.0000 ug/min | INTRAVENOUS | Status: AC
  Administered 2020-10-12: 1 ug/min via INTRAVENOUS
  Filled 2020-10-09: qty 250

## 2020-10-09 MED ORDER — TRANEXAMIC ACID (OHS) PUMP PRIME SOLUTION
2.0000 mg/kg | INTRAVENOUS | Status: DC
  Filled 2020-10-09: qty 2.05

## 2020-10-09 MED ORDER — PLASMA-LYTE 148 IV SOLN
INTRAVENOUS | Status: DC
  Filled 2020-10-09: qty 2.5

## 2020-10-09 MED ORDER — SODIUM CHLORIDE 0.9 % IV SOLN
1.5000 g | INTRAVENOUS | Status: AC
  Administered 2020-10-12: 1.5 g via INTRAVENOUS
  Filled 2020-10-09: qty 1.5

## 2020-10-09 MED ORDER — VANCOMYCIN HCL 1500 MG/300ML IV SOLN
1500.0000 mg | INTRAVENOUS | Status: AC
  Administered 2020-10-12: 1500 mg via INTRAVENOUS
  Filled 2020-10-09: qty 300

## 2020-10-09 MED ORDER — POTASSIUM CHLORIDE 2 MEQ/ML IV SOLN
80.0000 meq | INTRAVENOUS | Status: DC
  Filled 2020-10-09: qty 40

## 2020-10-09 MED ORDER — MILRINONE LACTATE IN DEXTROSE 20-5 MG/100ML-% IV SOLN
0.3000 ug/kg/min | INTRAVENOUS | Status: DC
  Filled 2020-10-09: qty 100

## 2020-10-09 MED ORDER — INSULIN REGULAR(HUMAN) IN NACL 100-0.9 UT/100ML-% IV SOLN
INTRAVENOUS | Status: AC
  Administered 2020-10-12: 2 [IU]/h via INTRAVENOUS
  Filled 2020-10-09 (×2): qty 100

## 2020-10-09 MED ORDER — MANNITOL 20 % IV SOLN
Freq: Once | INTRAVENOUS | Status: DC
  Filled 2020-10-09: qty 13

## 2020-10-09 MED ORDER — NITROGLYCERIN IN D5W 200-5 MCG/ML-% IV SOLN
2.0000 ug/min | INTRAVENOUS | Status: AC
  Administered 2020-10-12: 10 ug/min via INTRAVENOUS
  Filled 2020-10-09: qty 250

## 2020-10-09 NOTE — Anesthesia Preprocedure Evaluation (Addendum)
Anesthesia Evaluation  Patient identified by MRN, date of birth, ID band Patient awake    Reviewed: Allergy & Precautions, NPO status , Patient's Chart, lab work & pertinent test results, reviewed documented beta blocker date and time   History of Anesthesia Complications Negative for: history of anesthetic complications  Airway Mallampati: III  TM Distance: >3 FB Neck ROM: Full    Dental  (+) Dental Advisory Given, Chipped, Missing, Caps   Pulmonary former smoker,  10/08/2020 SARS coronavirus NEG   breath sounds clear to auscultation       Cardiovascular hypertension, Pt. on medications and Pt. on home beta blockers + angina + CAD (severe multivessel disease)   Rhythm:Regular Rate:Bradycardia  07/2020 ECHO: Normal LV systolic function with visual EF 55-60%.  Mild LVH. Normal global wall motion. Mild MR, mild TR.     Neuro/Psych negative neurological ROS     GI/Hepatic negative GI ROS, Neg liver ROS,   Endo/Other  diabetes (glu 116), Insulin Dependent, Oral Hypoglycemic AgentsMorbid obesity  Renal/GU Renal InsufficiencyRenal disease (creat 1.55)   H/o prostate cancer    Musculoskeletal   Abdominal (+) + obese,   Peds  Hematology negative hematology ROS (+)   Anesthesia Other Findings   Reproductive/Obstetrics                           Anesthesia Physical Anesthesia Plan  ASA: III  Anesthesia Plan: General   Post-op Pain Management:    Induction: Intravenous  PONV Risk Score and Plan: 2 and Treatment may vary due to age or medical condition  Airway Management Planned: Oral ETT  Additional Equipment: Arterial line, PA Cath, TEE and Ultrasound Guidance Line Placement  Intra-op Plan:   Post-operative Plan: Post-operative intubation/ventilation  Informed Consent: I have reviewed the patients History and Physical, chart, labs and discussed the procedure including the risks, benefits  and alternatives for the proposed anesthesia with the patient or authorized representative who has indicated his/her understanding and acceptance.     Dental advisory given  Plan Discussed with: CRNA and Surgeon  Anesthesia Plan Comments: (PAT note written 10/09/2020 by Myra Gianotti, PA-C. )      Anesthesia Quick Evaluation

## 2020-10-09 NOTE — Progress Notes (Signed)
Anesthesia Chart Review:  Case: [id number] Date/Time: 10/12/20 0715   Procedures:      CORONARY ARTERY BYPASS GRAFTING (CABG) (N/A Chest) - Possible BIMA     RADIAL ARTERY HARVEST (Left Arm Lower)     TRANSESOPHAGEAL ECHOCARDIOGRAM (TEE) (N/A )     INDOCYANINE GREEN FLUORESCENCE IMAGING (ICG) (N/A )   Anesthesia type: General   Pre-op diagnosis: CAD   Location: [facility] OR ROOM 14 / [facility] OR   Surgeons: Wonda Olds, MD      DISCUSSION: Patient is an 85 year old male scheduled for the above procedure.  History includes former smoker (quit 02/13/74), CAD, DM2, HTN, exertional dyspnea, prostate cancer (s/p robotic-assisted laparoscopic radical retropubic prostatectomy 06/13/07), neck surgery (C3-6 ACDF 06/04/99). Labs in [facility] form 2022 suggest CKD (Cr 1.31-1.55 since 08/10/20). BMI is consistent with obesity.  10/08/20 presurgical COVID-19 test negative. Anesthesia team to evaluate on the day of surgery.    VS: BP (!) 133/58   Pulse (!) 48   Temp 37 C (Oral)   Resp 18   Ht 5\' 6"  (1.676 m)   Wt 102.4 kg   SpO2 100%   BMI 36.43 kg/m   PROVIDERS: Seward Carol, MD is PCP  [name], MD is cardiologist   LABS: Preoperative labs noted. Cr 1.55, previously 1.40 on 09/11/20. A1c 9.0%. Labs are marked as reviewed by Dr. Orvan Seen. (all labs ordered are listed, but only abnormal results are displayed)  Labs Reviewed  GLUCOSE, CAPILLARY - Abnormal; Notable for the following components:      Result Value   Glucose-Capillary 219 (*)    All other components within normal limits  CBC - Abnormal; Notable for the following components:   RBC 4.21 (*)    Hemoglobin 12.5 (*)    HCT 37.7 (*)    All other components within normal limits  COMPREHENSIVE METABOLIC PANEL - Abnormal; Notable for the following components:   Sodium 133 (*)    CO2 21 (*)    Glucose, Bld 234 (*)    Creatinine, Ser 1.55 (*)    GFR, Estimated 44 (*)    All other components within normal limits  URINALYSIS, ROUTINE W REFLEX  MICROSCOPIC - Abnormal; Notable for the following components:   Glucose, UA >=500 (*)    All other components within normal limits  BLOOD GAS, ARTERIAL - Abnormal; Notable for the following components:   pO2, Arterial 123 (*)    All other components within normal limits  HEMOGLOBIN A1C - Abnormal; Notable for the following components:   Hgb A1c MFr Bld 9.0 (*)    All other components within normal limits  SURGICAL PCR SCREEN  PROTIME-INR  APTT  TYPE AND SCREEN     IMAGES: CXR 10/08/20: FINDINGS: Partially visualized surgical hardware from ACDF. Stable cardiomediastinal silhouette with normal heart size. No pneumothorax. No pleural effusion. Lungs appear clear, with no acute consolidative airspace disease and no pulmonary edema. IMPRESSION: No active cardiopulmonary disease.   EKG: 09/30/20: Sinus bradycardia at 57 bpm Right bundle branch block Abnormal ECG No significant change since 08/18/20 Confirmed by Jenkins Rouge [id number]) on 10/01/2020 8:11:34 AM   CV: Carotid US 10/08/20: Summary:  Right Carotid: Velocities in the right ICA are consistent with a 1-39%  stenosis.  Left Carotid: Velocities in the left ICA are consistent with a 1-39%  stenosis.  Vertebrals: Bilateral vertebral arteries demonstrate antegrade flow.  Subclavians: Normal flow hemodynamics were seen in bilateral subclavian arteries.    Cardiac cath 09/30/20:  Ost LM to Mid LM lesion is 40% stenosed.  Ost LAD to Prox LAD lesion is 75% stenosed.  Ost Cx to Prox Cx lesion is 100% stenosed.   1. Severe multivessel CAD with CTO of the ostial LCx and significant proximal LAD disease by iFR of 0.71  Plan: consider CABG   LHC 08/18/20: LV: Normal LV systolic function. Normal EDP. No pressure gradient across the aortic valve. Left main: Very short. Mildly calcified. LAD: Large caliber vessel. Has moderate diffuse coronary calcification. Proximal segment has a 30 to 35% stenosis and mid to distal segment  is diffusely diseased and focal 30% stenosis in the distal segment. No significant change from prior angiography 2018. Circumflex: Very large caliber vessel and a large vessel. Heavily calcified and flush occluded at the ostium. Faint TIMI I flow is evident distally. Collaterals are evident from the RCA to the circumflex. RCA: Dominant. Mild to moderate disease in the proximal segment with calcification. Tortuous. Distal right has calcific 60% stenosis, again not significantly changed from prior angiography in 2018. Collaterals are noted to the circumflex. Attempted angioplasty and recommendations: In spite of aggressive attempted angioplasty, failed attempt. I will refer the patient to Dr. Peter Martinique to see whether she would be a candidate for attempted revascularization of the CTO.   Echocardiogram 07/14/20:  Normal LV systolic function with visual EF 55-60%. Left ventricle cavity  is normal in size. Mild left ventricular hypertrophy. Normal global wall  motion. Indeterminate diastolic filling pattern, normal LAP.  Mild (Grade I) mitral regurgitation.  Mild tricuspid regurgitation.  Compared to prior study dated 09/13/2017 no significant change.   Past Medical History:  Diagnosis Date  . Cancer [facility])    Prostate  . Coronary artery disease   . Diabetes mellitus without complication ([place])   . Dyspnea    with exterion  . Hyperlipidemia   . Hypertension     Past Surgical History:  Procedure Laterality Date  . ANTERIOR FUSION CERVICAL SPINE    . CORONARY ANGIOGRAPHY N/A 09/30/2020   Procedure: CORONARY ANGIOGRAPHY;  Surgeon: Martinique, Peter M, MD;  Location: [place] [facility] LAB;  Service: Cardiovascular;  Laterality: N/A;  . HERNIA REPAIR    . INTRAVASCULAR PRESSURE WIRE/FFR STUDY N/A 09/30/2020   Procedure: INTRAVASCULAR PRESSURE WIRE/FFR STUDY;  Surgeon: Martinique, Peter M, MD;  Location: [place] [facility];  Service: Cardiovascular;  Laterality: N/A;  . LEFT HEART CATH AND CORONARY  ANGIOGRAPHY N/A 11/15/2016   Procedure: Left Heart Cath and Coronary Angiography;  Surgeon: [name], MD;  Location: [place] [facility];  Service: Cardiovascular;  Laterality: N/A;  . LEFT HEART CATH AND CORONARY ANGIOGRAPHY N/A 08/18/2020   Procedure: LEFT HEART CATH AND CORONARY ANGIOGRAPHY;  Surgeon: [name], MD;  Location: [place] [facility];  Service: Cardiovascular;  Laterality: N/A;  . PROSTATE ABLATION    . ROBOT ASSISTED LAPAROSCOPIC RADICAL PROSTATECTOMY  06/13/2007    MEDICATIONS: . ACCU-CHEK AVIVA PLUS test strip  . amLODipine (NORVASC) 5 MG tablet  . aspirin 81 MG tablet  . isosorbide mononitrate (IMDUR) 60 MG 24 hr tablet  . LANTUS SOLOSTAR 100 UNIT/ML Solostar Pen  . metFORMIN (GLUCOPHAGE-XR) 500 MG 24 hr tablet  . metoprolol succinate (TOPROL-XL) 50 MG 24 hr tablet  . nitroGLYCERIN (NITROSTAT) 0.4 MG SL tablet  . pravastatin (PRAVACHOL) 80 MG tablet  . telmisartan (MICARDIS) 80 MG tablet   . sodium chloride flush (NS) 0.9 % injection 3 mL    Myra Gianotti, PA-C Surgical Short Stay/Anesthesiology  [facility] Phone [phone] [facility] Phone [phone] 10/09/2020 9:22 AM

## 2020-10-11 MED ORDER — MAGNESIUM SULFATE 50 % IJ SOLN
40.0000 meq | INTRAMUSCULAR | Status: DC
  Filled 2020-10-11: qty 9.85

## 2020-10-12 ENCOUNTER — Other Ambulatory Visit: Payer: Medicare Other

## 2020-10-12 ENCOUNTER — Inpatient Hospital Stay (HOSPITAL_COMMUNITY)
Admission: RE | Admit: 2020-10-12 | Discharge: 2020-10-16 | DRG: 236 | Disposition: A | Discharge status: Home / Self Care | Payer: Medicare Other | Attending: Cardiothoracic Surgery | Admitting: Cardiothoracic Surgery

## 2020-10-12 ENCOUNTER — Inpatient Hospital Stay (HOSPITAL_COMMUNITY): Payer: Medicare Other

## 2020-10-12 ENCOUNTER — Encounter (HOSPITAL_COMMUNITY): Payer: Self-pay | Admitting: Cardiothoracic Surgery

## 2020-10-12 ENCOUNTER — Inpatient Hospital Stay (HOSPITAL_COMMUNITY)
Admission: RE | Disposition: A | Discharge status: Home / Self Care | Payer: Self-pay | Source: Home / Self Care | Attending: Cardiothoracic Surgery

## 2020-10-12 ENCOUNTER — Inpatient Hospital Stay (HOSPITAL_COMMUNITY): Payer: Medicare Other | Admitting: Vascular Surgery

## 2020-10-12 ENCOUNTER — Other Ambulatory Visit: Payer: Self-pay

## 2020-10-12 ENCOUNTER — Inpatient Hospital Stay (HOSPITAL_COMMUNITY): Payer: Medicare Other | Admitting: Anesthesiology

## 2020-10-12 DIAGNOSIS — J9811 Atelectasis: Secondary | ICD-10-CM | POA: Diagnosis not present

## 2020-10-12 DIAGNOSIS — Z79891 Long term (current) use of opiate analgesic: Secondary | ICD-10-CM

## 2020-10-12 DIAGNOSIS — I10 Essential (primary) hypertension: Secondary | ICD-10-CM | POA: Diagnosis present

## 2020-10-12 DIAGNOSIS — I2510 Atherosclerotic heart disease of native coronary artery without angina pectoris: Secondary | ICD-10-CM

## 2020-10-12 DIAGNOSIS — Z794 Long term (current) use of insulin: Secondary | ICD-10-CM

## 2020-10-12 DIAGNOSIS — Z79899 Other long term (current) drug therapy: Secondary | ICD-10-CM

## 2020-10-12 DIAGNOSIS — I517 Cardiomegaly: Secondary | ICD-10-CM | POA: Diagnosis not present

## 2020-10-12 DIAGNOSIS — D62 Acute posthemorrhagic anemia: Secondary | ICD-10-CM | POA: Diagnosis not present

## 2020-10-12 DIAGNOSIS — Z7984 Long term (current) use of oral hypoglycemic drugs: Secondary | ICD-10-CM | POA: Diagnosis not present

## 2020-10-12 DIAGNOSIS — I361 Nonrheumatic tricuspid (valve) insufficiency: Secondary | ICD-10-CM | POA: Diagnosis not present

## 2020-10-12 DIAGNOSIS — Z87891 Personal history of nicotine dependence: Secondary | ICD-10-CM | POA: Diagnosis not present

## 2020-10-12 DIAGNOSIS — E785 Hyperlipidemia, unspecified: Secondary | ICD-10-CM | POA: Diagnosis present

## 2020-10-12 DIAGNOSIS — E119 Type 2 diabetes mellitus without complications: Secondary | ICD-10-CM | POA: Diagnosis present

## 2020-10-12 DIAGNOSIS — Z7982 Long term (current) use of aspirin: Secondary | ICD-10-CM

## 2020-10-12 DIAGNOSIS — D6959 Other secondary thrombocytopenia: Secondary | ICD-10-CM | POA: Diagnosis not present

## 2020-10-12 DIAGNOSIS — Z4682 Encounter for fitting and adjustment of non-vascular catheter: Secondary | ICD-10-CM | POA: Diagnosis not present

## 2020-10-12 DIAGNOSIS — Z951 Presence of aortocoronary bypass graft: Secondary | ICD-10-CM | POA: Diagnosis not present

## 2020-10-12 DIAGNOSIS — Z09 Encounter for follow-up examination after completed treatment for conditions other than malignant neoplasm: Secondary | ICD-10-CM

## 2020-10-12 DIAGNOSIS — Z8546 Personal history of malignant neoplasm of prostate: Secondary | ICD-10-CM

## 2020-10-12 DIAGNOSIS — I371 Nonrheumatic pulmonary valve insufficiency: Secondary | ICD-10-CM | POA: Diagnosis not present

## 2020-10-12 DIAGNOSIS — Z9889 Other specified postprocedural states: Secondary | ICD-10-CM

## 2020-10-12 DIAGNOSIS — I25119 Atherosclerotic heart disease of native coronary artery with unspecified angina pectoris: Secondary | ICD-10-CM | POA: Diagnosis not present

## 2020-10-12 HISTORY — PX: TEE WITHOUT CARDIOVERSION: SHX5443

## 2020-10-12 HISTORY — PX: CORONARY ARTERY BYPASS GRAFT: SHX141

## 2020-10-12 LAB — BASIC METABOLIC PANEL
Anion gap: 8 (ref 5–15)
BUN: 20 mg/dL (ref 8–23)
CO2: 22 mmol/L (ref 22–32)
Calcium: 8.4 mg/dL — ABNORMAL LOW (ref 8.9–10.3)
Chloride: 107 mmol/L (ref 98–111)
Creatinine, Ser: 1.3 mg/dL — ABNORMAL HIGH (ref 0.61–1.24)
GFR, Estimated: 55 mL/min — ABNORMAL LOW (ref >60)
Glucose, Bld: 107 mg/dL — ABNORMAL HIGH (ref 70–99)
Potassium: 4.3 mmol/L (ref 3.5–5.1)
Sodium: 137 mmol/L (ref 135–145)

## 2020-10-12 LAB — POCT I-STAT 7, (LYTES, BLD GAS, ICA,H+H)
Acid-Base Excess: 0 mmol/L (ref 0.0–2.0)
Acid-Base Excess: 0 mmol/L (ref 0.0–2.0)
Acid-base deficit: 1 mmol/L (ref 0.0–2.0)
Acid-base deficit: 4 mmol/L — ABNORMAL HIGH (ref 0.0–2.0)
Acid-base deficit: 6 mmol/L — ABNORMAL HIGH (ref 0.0–2.0)
Acid-base deficit: 4 mmol/L — ABNORMAL HIGH (ref 0.0–2.0)
Bicarbonate: 24.6 mmol/L (ref 20.0–28.0)
Bicarbonate: 23.7 mmol/L (ref 20.0–28.0)
Bicarbonate: 24.1 mmol/L (ref 20.0–28.0)
Bicarbonate: 21.7 mmol/L (ref 20.0–28.0)
Bicarbonate: 19.5 mmol/L — ABNORMAL LOW (ref 20.0–28.0)
Bicarbonate: 21.9 mmol/L (ref 20.0–28.0)
Calcium, Ion: 1.06 mmol/L — ABNORMAL LOW (ref 1.15–1.40)
Calcium, Ion: 1.14 mmol/L — ABNORMAL LOW (ref 1.15–1.40)
Calcium, Ion: 1.24 mmol/L (ref 1.15–1.40)
Calcium, Ion: 1.26 mmol/L (ref 1.15–1.40)
Calcium, Ion: 1.24 mmol/L (ref 1.15–1.40)
Calcium, Ion: 1.23 mmol/L (ref 1.15–1.40)
HCT: 23 % — ABNORMAL LOW (ref 39.0–52.0)
HCT: 24 % — ABNORMAL LOW (ref 39.0–52.0)
HCT: 26 % — ABNORMAL LOW (ref 39.0–52.0)
HCT: 31 % — ABNORMAL LOW (ref 39.0–52.0)
HCT: 28 % — ABNORMAL LOW (ref 39.0–52.0)
HCT: 29 % — ABNORMAL LOW (ref 39.0–52.0)
Hemoglobin: 7.8 g/dL — ABNORMAL LOW (ref 13.0–17.0)
Hemoglobin: 8.2 g/dL — ABNORMAL LOW (ref 13.0–17.0)
Hemoglobin: 8.8 g/dL — ABNORMAL LOW (ref 13.0–17.0)
Hemoglobin: 10.5 g/dL — ABNORMAL LOW (ref 13.0–17.0)
Hemoglobin: 9.5 g/dL — ABNORMAL LOW (ref 13.0–17.0)
Hemoglobin: 9.9 g/dL — ABNORMAL LOW (ref 13.0–17.0)
O2 Saturation: 100 %
O2 Saturation: 100 %
O2 Saturation: 100 %
O2 Saturation: 99 %
O2 Saturation: 99 %
O2 Saturation: 98 %
Patient temperature: 35.6
Patient temperature: 36.5
Patient temperature: 36.7
Potassium: 4.3 mmol/L (ref 3.5–5.1)
Potassium: 4.9 mmol/L (ref 3.5–5.1)
Potassium: 4.8 mmol/L (ref 3.5–5.1)
Potassium: 4.2 mmol/L (ref 3.5–5.1)
Potassium: 4.5 mmol/L (ref 3.5–5.1)
Potassium: 4.3 mmol/L (ref 3.5–5.1)
Sodium: 138 mmol/L (ref 135–145)
Sodium: 135 mmol/L (ref 135–145)
Sodium: 136 mmol/L (ref 135–145)
Sodium: 139 mmol/L (ref 135–145)
Sodium: 139 mmol/L (ref 135–145)
Sodium: 141 mmol/L (ref 135–145)
TCO2: 26 mmol/L (ref 22–32)
TCO2: 25 mmol/L (ref 22–32)
TCO2: 25 mmol/L (ref 22–32)
TCO2: 23 mmol/L (ref 22–32)
TCO2: 21 mmol/L — ABNORMAL LOW (ref 22–32)
TCO2: 23 mmol/L (ref 22–32)
pCO2 arterial: 38.6 mmHg (ref 32.0–48.0)
pCO2 arterial: 37.7 mmHg (ref 32.0–48.0)
pCO2 arterial: 37.3 mmHg (ref 32.0–48.0)
pCO2 arterial: 38.6 mmHg (ref 32.0–48.0)
pCO2 arterial: 37 mmHg (ref 32.0–48.0)
pCO2 arterial: 40.3 mmHg (ref 32.0–48.0)
pH, Arterial: 7.413 (ref 7.350–7.450)
pH, Arterial: 7.408 (ref 7.350–7.450)
pH, Arterial: 7.418 (ref 7.350–7.450)
pH, Arterial: 7.351 (ref 7.350–7.450)
pH, Arterial: 7.329 — ABNORMAL LOW (ref 7.350–7.450)
pH, Arterial: 7.341 — ABNORMAL LOW (ref 7.350–7.450)
pO2, Arterial: 407 mmHg — ABNORMAL HIGH (ref 83.0–108.0)
pO2, Arterial: 343 mmHg — ABNORMAL HIGH (ref 83.0–108.0)
pO2, Arterial: 342 mmHg — ABNORMAL HIGH (ref 83.0–108.0)
pO2, Arterial: 123 mmHg — ABNORMAL HIGH (ref 83.0–108.0)
pO2, Arterial: 121 mmHg — ABNORMAL HIGH (ref 83.0–108.0)
pO2, Arterial: 119 mmHg — ABNORMAL HIGH (ref 83.0–108.0)

## 2020-10-12 LAB — POCT I-STAT, CHEM 8
BUN: 25 mg/dL — ABNORMAL HIGH (ref 8–23)
BUN: 24 mg/dL — ABNORMAL HIGH (ref 8–23)
BUN: 23 mg/dL (ref 8–23)
BUN: 23 mg/dL (ref 8–23)
Calcium, Ion: 1.34 mmol/L (ref 1.15–1.40)
Calcium, Ion: 1.28 mmol/L (ref 1.15–1.40)
Calcium, Ion: 1.18 mmol/L (ref 1.15–1.40)
Calcium, Ion: 1.22 mmol/L (ref 1.15–1.40)
Chloride: 103 mmol/L (ref 98–111)
Chloride: 102 mmol/L (ref 98–111)
Chloride: 103 mmol/L (ref 98–111)
Chloride: 103 mmol/L (ref 98–111)
Creatinine, Ser: 1.3 mg/dL — ABNORMAL HIGH (ref 0.61–1.24)
Creatinine, Ser: 1.3 mg/dL — ABNORMAL HIGH (ref 0.61–1.24)
Creatinine, Ser: 1.1 mg/dL (ref 0.61–1.24)
Creatinine, Ser: 1.2 mg/dL (ref 0.61–1.24)
Glucose, Bld: 51 mg/dL — ABNORMAL LOW (ref 70–99)
Glucose, Bld: 119 mg/dL — ABNORMAL HIGH (ref 70–99)
Glucose, Bld: 115 mg/dL — ABNORMAL HIGH (ref 70–99)
Glucose, Bld: 118 mg/dL — ABNORMAL HIGH (ref 70–99)
HCT: 29 % — ABNORMAL LOW (ref 39.0–52.0)
HCT: 28 % — ABNORMAL LOW (ref 39.0–52.0)
HCT: 24 % — ABNORMAL LOW (ref 39.0–52.0)
HCT: 26 % — ABNORMAL LOW (ref 39.0–52.0)
Hemoglobin: 9.9 g/dL — ABNORMAL LOW (ref 13.0–17.0)
Hemoglobin: 9.5 g/dL — ABNORMAL LOW (ref 13.0–17.0)
Hemoglobin: 8.2 g/dL — ABNORMAL LOW (ref 13.0–17.0)
Hemoglobin: 8.8 g/dL — ABNORMAL LOW (ref 13.0–17.0)
Potassium: 4.4 mmol/L (ref 3.5–5.1)
Potassium: 4.3 mmol/L (ref 3.5–5.1)
Potassium: 5.2 mmol/L — ABNORMAL HIGH (ref 3.5–5.1)
Potassium: 4.8 mmol/L (ref 3.5–5.1)
Sodium: 139 mmol/L (ref 135–145)
Sodium: 136 mmol/L (ref 135–145)
Sodium: 135 mmol/L (ref 135–145)
Sodium: 136 mmol/L (ref 135–145)
TCO2: 26 mmol/L (ref 22–32)
TCO2: 25 mmol/L (ref 22–32)
TCO2: 25 mmol/L (ref 22–32)
TCO2: 24 mmol/L (ref 22–32)

## 2020-10-12 LAB — POCT I-STAT EG7
Acid-base deficit: 1 mmol/L (ref 0.0–2.0)
Bicarbonate: 24.7 mmol/L (ref 20.0–28.0)
Calcium, Ion: 1.14 mmol/L — ABNORMAL LOW (ref 1.15–1.40)
HCT: 24 % — ABNORMAL LOW (ref 39.0–52.0)
Hemoglobin: 8.2 g/dL — ABNORMAL LOW (ref 13.0–17.0)
O2 Saturation: 80 %
Potassium: 4.2 mmol/L (ref 3.5–5.1)
Sodium: 138 mmol/L (ref 135–145)
TCO2: 26 mmol/L (ref 22–32)
pCO2, Ven: 43.1 mmHg — ABNORMAL LOW (ref 44.0–60.0)
pH, Ven: 7.366 (ref 7.250–7.430)
pO2, Ven: 46 mmHg — ABNORMAL HIGH (ref 32.0–45.0)

## 2020-10-12 LAB — GLUCOSE, CAPILLARY
Glucose-Capillary: 116 mg/dL — ABNORMAL HIGH (ref 70–99)
Glucose-Capillary: 79 mg/dL (ref 70–99)
Glucose-Capillary: 84 mg/dL (ref 70–99)
Glucose-Capillary: 86 mg/dL (ref 70–99)
Glucose-Capillary: 82 mg/dL (ref 70–99)
Glucose-Capillary: 95 mg/dL (ref 70–99)
Glucose-Capillary: 98 mg/dL (ref 70–99)
Glucose-Capillary: 99 mg/dL (ref 70–99)
Glucose-Capillary: 103 mg/dL — ABNORMAL HIGH (ref 70–99)
Glucose-Capillary: 115 mg/dL — ABNORMAL HIGH (ref 70–99)
Glucose-Capillary: 109 mg/dL — ABNORMAL HIGH (ref 70–99)
Glucose-Capillary: 129 mg/dL — ABNORMAL HIGH (ref 70–99)
Glucose-Capillary: 118 mg/dL — ABNORMAL HIGH (ref 70–99)
Glucose-Capillary: 157 mg/dL — ABNORMAL HIGH (ref 70–99)

## 2020-10-12 LAB — CBC
HCT: 29.7 % — ABNORMAL LOW (ref 39.0–52.0)
HCT: 29.2 % — ABNORMAL LOW (ref 39.0–52.0)
Hemoglobin: 10.1 g/dL — ABNORMAL LOW (ref 13.0–17.0)
Hemoglobin: 9.8 g/dL — ABNORMAL LOW (ref 13.0–17.0)
MCH: 30.6 pg (ref 26.0–34.0)
MCH: 29.9 pg (ref 26.0–34.0)
MCHC: 34 g/dL (ref 30.0–36.0)
MCHC: 33.6 g/dL (ref 30.0–36.0)
MCV: 90 fL (ref 80.0–100.0)
MCV: 89 fL (ref 80.0–100.0)
Platelets: 149 10*3/uL — ABNORMAL LOW (ref 150–400)
Platelets: 151 10*3/uL (ref 150–400)
RBC: 3.3 MIL/uL — ABNORMAL LOW (ref 4.22–5.81)
RBC: 3.28 MIL/uL — ABNORMAL LOW (ref 4.22–5.81)
RDW: 12.9 % (ref 11.5–15.5)
RDW: 13.2 % (ref 11.5–15.5)
WBC: 9.1 10*3/uL (ref 4.0–10.5)
WBC: 7.9 10*3/uL (ref 4.0–10.5)
nRBC: 0 % (ref 0.0–0.2)
nRBC: 0 % (ref 0.0–0.2)

## 2020-10-12 LAB — APTT: aPTT: 37 seconds — ABNORMAL HIGH (ref 24–36)

## 2020-10-12 LAB — PREPARE RBC (CROSSMATCH)

## 2020-10-12 LAB — HEMOGLOBIN AND HEMATOCRIT, BLOOD
HCT: 23.6 % — ABNORMAL LOW (ref 39.0–52.0)
Hemoglobin: 7.9 g/dL — ABNORMAL LOW (ref 13.0–17.0)

## 2020-10-12 LAB — PLATELET COUNT: Platelets: 163 10*3/uL (ref 150–400)

## 2020-10-12 LAB — ECHO INTRAOPERATIVE TEE
AV Mean grad: 6 mmHg
Height: 66 in
Weight: 3584 oz

## 2020-10-12 LAB — ABO/RH: ABO/RH(D): O POS

## 2020-10-12 LAB — PROTIME-INR
INR: 1.4 — ABNORMAL HIGH (ref 0.8–1.2)
Prothrombin Time: 16.8 seconds — ABNORMAL HIGH (ref 11.4–15.2)

## 2020-10-12 LAB — MAGNESIUM: Magnesium: 2.6 mg/dL — ABNORMAL HIGH (ref 1.7–2.4)

## 2020-10-12 SURGERY — CORONARY ARTERY BYPASS GRAFTING (CABG)
Anesthesia: General | Site: Chest

## 2020-10-12 MED ORDER — CHLORHEXIDINE GLUCONATE 0.12 % MT SOLN
15.0000 mL | Freq: Once | OROMUCOSAL | Status: AC
  Administered 2020-10-12: 15 mL via OROMUCOSAL
  Filled 2020-10-12: qty 15

## 2020-10-12 MED ORDER — PROPOFOL 10 MG/ML IV BOLUS
INTRAVENOUS | Status: DC | PRN
  Administered 2020-10-12: 30 mg via INTRAVENOUS

## 2020-10-12 MED ORDER — PHENYLEPHRINE HCL-NACL 20-0.9 MG/250ML-% IV SOLN
30.0000 ug/min | INTRAVENOUS | Status: DC
  Filled 2020-10-12: qty 250

## 2020-10-12 MED ORDER — PROTAMINE SULFATE 10 MG/ML IV SOLN
INTRAVENOUS | Status: DC | PRN
  Administered 2020-10-12: 300 mg via INTRAVENOUS

## 2020-10-12 MED ORDER — INSULIN REGULAR(HUMAN) IN NACL 100-0.9 UT/100ML-% IV SOLN: INTRAVENOUS | Status: DC

## 2020-10-12 MED ORDER — LACTATED RINGERS IV SOLN
500.0000 mL | Freq: Once | INTRAVENOUS | Status: AC | PRN
  Administered 2020-10-12: 500 mL via INTRAVENOUS

## 2020-10-12 MED ORDER — PROTAMINE SULFATE 10 MG/ML IV SOLN
INTRAVENOUS | Status: AC
  Filled 2020-10-12: qty 25

## 2020-10-12 MED ORDER — PROPOFOL 10 MG/ML IV BOLUS
INTRAVENOUS | Status: AC
  Filled 2020-10-12: qty 20

## 2020-10-12 MED ORDER — PLATELET RICH PLASMA OPTIME
Status: DC | PRN
  Administered 2020-10-12: 10 mL

## 2020-10-12 MED ORDER — STERILE WATER FOR INJECTION IJ SOLN
INTRAMUSCULAR | Status: AC
  Filled 2020-10-12: qty 10

## 2020-10-12 MED ORDER — 0.9 % SODIUM CHLORIDE (POUR BTL) OPTIME
TOPICAL | Status: DC | PRN
  Administered 2020-10-12: 5000 mL

## 2020-10-12 MED ORDER — HEPARIN SODIUM (PORCINE) 1000 UNIT/ML IJ SOLN
INTRAMUSCULAR | Status: DC | PRN
  Administered 2020-10-12: 30000 [IU] via INTRAVENOUS

## 2020-10-12 MED ORDER — NITROGLYCERIN IN D5W 200-5 MCG/ML-% IV SOLN: 0.0000 ug/min | INTRAVENOUS | Status: DC

## 2020-10-12 MED ORDER — SODIUM CHLORIDE 0.9% FLUSH: 3.0000 mL | INTRAVENOUS | Status: DC | PRN

## 2020-10-12 MED ORDER — METOPROLOL TARTRATE 5 MG/5ML IV SOLN: 2.5000 mg | INTRAVENOUS | Status: DC | PRN

## 2020-10-12 MED ORDER — ACETAMINOPHEN 650 MG RE SUPP
650.0000 mg | Freq: Once | RECTAL | Status: AC
  Administered 2020-10-12: 650 mg via RECTAL

## 2020-10-12 MED ORDER — ALBUMIN HUMAN 5 % IV SOLN: INTRAVENOUS | Status: DC | PRN

## 2020-10-12 MED ORDER — MAGNESIUM SULFATE 4 GM/100ML IV SOLN
4.0000 g | Freq: Once | INTRAVENOUS | Status: AC
  Administered 2020-10-12: 4 g via INTRAVENOUS
  Filled 2020-10-12: qty 100

## 2020-10-12 MED ORDER — NOREPINEPHRINE 4 MG/250ML-% IV SOLN
0.0000 ug/min | INTRAVENOUS | Status: DC
  Administered 2020-10-12: 2 ug/min via INTRAVENOUS

## 2020-10-12 MED ORDER — ARTIFICIAL TEARS OPHTHALMIC OINT
TOPICAL_OINTMENT | OPHTHALMIC | Status: DC | PRN
  Administered 2020-10-12: 1 via OPHTHALMIC

## 2020-10-12 MED ORDER — SODIUM CHLORIDE 0.9% FLUSH
10.0000 mL | Freq: Two times a day (BID) | INTRAVENOUS | Status: DC
  Administered 2020-10-12 – 2020-10-15 (×3): 10 mL

## 2020-10-12 MED ORDER — SODIUM CHLORIDE 0.9% FLUSH: 10.0000 mL | INTRAVENOUS | Status: DC | PRN

## 2020-10-12 MED ORDER — STERILE WATER FOR INJECTION IJ SOLN
INTRAMUSCULAR | Status: DC | PRN
  Administered 2020-10-12: 10 mL

## 2020-10-12 MED ORDER — TRAMADOL HCL 50 MG PO TABS
50.0000 mg | ORAL_TABLET | ORAL | Status: DC | PRN
  Administered 2020-10-13 (×2): 100 mg via ORAL
  Filled 2020-10-12 (×2): qty 2

## 2020-10-12 MED ORDER — PHENYLEPHRINE 40 MCG/ML (10ML) SYRINGE FOR IV PUSH (FOR BLOOD PRESSURE SUPPORT)
PREFILLED_SYRINGE | INTRAVENOUS | Status: DC | PRN
Start: 2020-10-12 — End: 2020-10-12
  Administered 2020-10-12: 80 ug via INTRAVENOUS
  Administered 2020-10-12: 120 ug via INTRAVENOUS

## 2020-10-12 MED ORDER — ASPIRIN 81 MG PO CHEW: 324.0000 mg | CHEWABLE_TABLET | Freq: Every day | ORAL | Status: DC

## 2020-10-12 MED ORDER — BISACODYL 5 MG PO TBEC
10.0000 mg | DELAYED_RELEASE_TABLET | Freq: Every day | ORAL | Status: DC
  Administered 2020-10-13 – 2020-10-14 (×2): 10 mg via ORAL
  Filled 2020-10-12 (×2): qty 2

## 2020-10-12 MED ORDER — CHLORHEXIDINE GLUCONATE 0.12 % MT SOLN
15.0000 mL | OROMUCOSAL | Status: AC
  Administered 2020-10-12: 15 mL via OROMUCOSAL

## 2020-10-12 MED ORDER — STERILE WATER FOR INJECTION IV SOLN
INTRAVENOUS | Status: DC | PRN
  Administered 2020-10-12: 30 mL

## 2020-10-12 MED ORDER — SODIUM BICARBONATE 8.4 % IV SOLN
100.0000 meq | Freq: Once | INTRAVENOUS | Status: AC
  Administered 2020-10-12: 100 meq via INTRAVENOUS

## 2020-10-12 MED ORDER — PLATELET POOR PLASMA OPTIME
Status: DC | PRN
  Administered 2020-10-12: 10 mL

## 2020-10-12 MED ORDER — CHLORHEXIDINE GLUCONATE 0.12% ORAL RINSE (MEDLINE KIT)
15.0000 mL | Freq: Two times a day (BID) | OROMUCOSAL | Status: DC
  Administered 2020-10-12: 15 mL via OROMUCOSAL

## 2020-10-12 MED ORDER — BUPIVACAINE LIPOSOME 1.3 % IJ SUSP
INTRAMUSCULAR | Status: AC
  Filled 2020-10-12: qty 20

## 2020-10-12 MED ORDER — VECURONIUM BROMIDE 10 MG IV SOLR
INTRAVENOUS | Status: DC | PRN
  Administered 2020-10-12: 5 mg via INTRAVENOUS

## 2020-10-12 MED ORDER — ONDANSETRON HCL 4 MG/2ML IJ SOLN
4.0000 mg | Freq: Four times a day (QID) | INTRAMUSCULAR | Status: DC | PRN
  Administered 2020-10-12 – 2020-10-13 (×3): 4 mg via INTRAVENOUS
  Filled 2020-10-12 (×3): qty 2

## 2020-10-12 MED ORDER — POTASSIUM CHLORIDE 10 MEQ/50ML IV SOLN: 10.0000 meq | INTRAVENOUS | Status: AC

## 2020-10-12 MED ORDER — VANCOMYCIN HCL IN DEXTROSE 1-5 GM/200ML-% IV SOLN
1000.0000 mg | Freq: Once | INTRAVENOUS | Status: AC
  Administered 2020-10-12: 1000 mg via INTRAVENOUS
  Filled 2020-10-12: qty 200

## 2020-10-12 MED ORDER — SODIUM CHLORIDE 0.9 % IV SOLN: INTRAVENOUS | Status: DC

## 2020-10-12 MED ORDER — SODIUM CHLORIDE 0.9 % IV SOLN
1.5000 g | Freq: Two times a day (BID) | INTRAVENOUS | Status: AC
  Administered 2020-10-12 – 2020-10-14 (×4): 1.5 g via INTRAVENOUS
  Filled 2020-10-12 (×4): qty 1.5

## 2020-10-12 MED ORDER — FENTANYL CITRATE (PF) 250 MCG/5ML IJ SOLN
INTRAMUSCULAR | Status: AC
  Filled 2020-10-12: qty 25

## 2020-10-12 MED ORDER — HEPARIN SODIUM (PORCINE) 1000 UNIT/ML IJ SOLN
INTRAMUSCULAR | Status: AC
  Filled 2020-10-12: qty 1

## 2020-10-12 MED ORDER — FENTANYL CITRATE (PF) 100 MCG/2ML IJ SOLN
INTRAMUSCULAR | Status: DC | PRN
  Administered 2020-10-12: 600 ug via INTRAVENOUS
  Administered 2020-10-12: 150 ug via INTRAVENOUS
  Administered 2020-10-12: 100 ug via INTRAVENOUS
  Administered 2020-10-12: 150 ug via INTRAVENOUS
  Administered 2020-10-12: 50 ug via INTRAVENOUS
  Administered 2020-10-12: 150 ug via INTRAVENOUS
  Administered 2020-10-12: 50 ug via INTRAVENOUS

## 2020-10-12 MED ORDER — HEMOSTATIC AGENTS (NO CHARGE) OPTIME
TOPICAL | Status: DC | PRN
  Administered 2020-10-12: 1 via TOPICAL

## 2020-10-12 MED ORDER — LACTATED RINGERS IV SOLN: INTRAVENOUS | Status: DC

## 2020-10-12 MED ORDER — METOPROLOL TARTRATE 12.5 MG HALF TABLET
12.5000 mg | ORAL_TABLET | Freq: Two times a day (BID) | ORAL | Status: DC
  Administered 2020-10-13 – 2020-10-16 (×5): 12.5 mg via ORAL
  Filled 2020-10-12 (×5): qty 1

## 2020-10-12 MED ORDER — ROCURONIUM BROMIDE 10 MG/ML (PF) SYRINGE
PREFILLED_SYRINGE | INTRAVENOUS | Status: DC | PRN
  Administered 2020-10-12: 70 mg via INTRAVENOUS
  Administered 2020-10-12: 30 mg via INTRAVENOUS

## 2020-10-12 MED ORDER — CHLORHEXIDINE GLUCONATE 4 % EX LIQD: 30.0000 mL | CUTANEOUS | Status: DC

## 2020-10-12 MED ORDER — PROTAMINE SULFATE 10 MG/ML IV SOLN
INTRAVENOUS | Status: AC
  Filled 2020-10-12: qty 5

## 2020-10-12 MED ORDER — ASPIRIN EC 325 MG PO TBEC
325.0000 mg | DELAYED_RELEASE_TABLET | Freq: Every day | ORAL | Status: DC
  Administered 2020-10-13 – 2020-10-16 (×4): 325 mg via ORAL
  Filled 2020-10-12 (×4): qty 1

## 2020-10-12 MED ORDER — MIDAZOLAM HCL (PF) 10 MG/2ML IJ SOLN
INTRAMUSCULAR | Status: AC
  Filled 2020-10-12: qty 2

## 2020-10-12 MED ORDER — SODIUM CHLORIDE 0.9 % IV SOLN: 250.0000 mL | INTRAVENOUS | Status: DC

## 2020-10-12 MED ORDER — BISACODYL 10 MG RE SUPP: 10.0000 mg | Freq: Every day | RECTAL | Status: DC

## 2020-10-12 MED ORDER — PANTOPRAZOLE SODIUM 40 MG PO TBEC
40.0000 mg | DELAYED_RELEASE_TABLET | Freq: Every day | ORAL | Status: DC
  Administered 2020-10-14 – 2020-10-16 (×3): 40 mg via ORAL
  Filled 2020-10-12 (×3): qty 1

## 2020-10-12 MED ORDER — ROCURONIUM BROMIDE 10 MG/ML (PF) SYRINGE
PREFILLED_SYRINGE | INTRAVENOUS | Status: AC
  Filled 2020-10-12: qty 10

## 2020-10-12 MED ORDER — PHENYLEPHRINE HCL-NACL 20-0.9 MG/250ML-% IV SOLN: 0.0000 ug/min | INTRAVENOUS | Status: DC

## 2020-10-12 MED ORDER — OXYCODONE HCL 5 MG PO TABS
5.0000 mg | ORAL_TABLET | ORAL | Status: DC | PRN
  Administered 2020-10-13 (×2): 10 mg via ORAL
  Filled 2020-10-12 (×2): qty 2

## 2020-10-12 MED ORDER — ACETAMINOPHEN 160 MG/5ML PO SOLN: 1000.0000 mg | Freq: Four times a day (QID) | ORAL | Status: DC

## 2020-10-12 MED ORDER — LIDOCAINE 2% (20 MG/ML) 5 ML SYRINGE
INTRAMUSCULAR | Status: AC
  Filled 2020-10-12: qty 5

## 2020-10-12 MED ORDER — SODIUM CHLORIDE 0.9 % IV SOLN: INTRAVENOUS | Status: DC | PRN

## 2020-10-12 MED ORDER — METOPROLOL TARTRATE 12.5 MG HALF TABLET
12.5000 mg | ORAL_TABLET | Freq: Once | ORAL | Status: DC
  Filled 2020-10-12: qty 1

## 2020-10-12 MED ORDER — ORAL CARE MOUTH RINSE
15.0000 mL | Freq: Two times a day (BID) | OROMUCOSAL | Status: DC
  Administered 2020-10-12 – 2020-10-14 (×5): 15 mL via OROMUCOSAL

## 2020-10-12 MED ORDER — VECURONIUM BROMIDE 10 MG IV SOLR
INTRAVENOUS | Status: AC
  Filled 2020-10-12: qty 10

## 2020-10-12 MED ORDER — DEXTROSE 50 % IV SOLN: 0.0000 mL | INTRAVENOUS | Status: DC | PRN

## 2020-10-12 MED ORDER — METOPROLOL TARTRATE 25 MG/10 ML ORAL SUSPENSION
12.5000 mg | Freq: Two times a day (BID) | ORAL | Status: DC
  Filled 2020-10-12 (×3): qty 5

## 2020-10-12 MED ORDER — DOCUSATE SODIUM 100 MG PO CAPS
200.0000 mg | ORAL_CAPSULE | Freq: Every day | ORAL | Status: DC
  Administered 2020-10-13 – 2020-10-16 (×3): 200 mg via ORAL
  Filled 2020-10-12 (×3): qty 2

## 2020-10-12 MED ORDER — MIDAZOLAM HCL 2 MG/2ML IJ SOLN: 2.0000 mg | INTRAMUSCULAR | Status: DC | PRN

## 2020-10-12 MED ORDER — ORAL CARE MOUTH RINSE
15.0000 mL | OROMUCOSAL | Status: DC
  Administered 2020-10-12 (×2): 15 mL via OROMUCOSAL

## 2020-10-12 MED ORDER — VANCOMYCIN HCL 1000 MG IV SOLR
INTRAVENOUS | Status: AC
  Filled 2020-10-12: qty 3000

## 2020-10-12 MED ORDER — VANCOMYCIN HCL 1000 MG IV SOLR
INTRAVENOUS | Status: DC | PRN
  Administered 2020-10-12: 3 g via TOPICAL

## 2020-10-12 MED ORDER — MORPHINE SULFATE (PF) 2 MG/ML IV SOLN
1.0000 mg | INTRAVENOUS | Status: DC | PRN
  Administered 2020-10-12: 3 mg via INTRAVENOUS
  Administered 2020-10-12: 2 mg via INTRAVENOUS
  Filled 2020-10-12: qty 2
  Filled 2020-10-12: qty 1

## 2020-10-12 MED ORDER — DEXAMETHASONE SODIUM PHOSPHATE 10 MG/ML IJ SOLN
INTRAMUSCULAR | Status: AC
  Filled 2020-10-12: qty 1

## 2020-10-12 MED ORDER — EPINEPHRINE HCL 5 MG/250ML IV SOLN IN NS: 0.0000 ug/min | INTRAVENOUS | Status: DC

## 2020-10-12 MED ORDER — LACTATED RINGERS IV SOLN: INTRAVENOUS | Status: DC | PRN

## 2020-10-12 MED ORDER — SODIUM CHLORIDE 0.9% FLUSH
3.0000 mL | Freq: Two times a day (BID) | INTRAVENOUS | Status: DC
  Administered 2020-10-13 – 2020-10-15 (×5): 3 mL via INTRAVENOUS

## 2020-10-12 MED ORDER — SODIUM CHLORIDE 0.45 % IV SOLN: INTRAVENOUS | Status: DC | PRN

## 2020-10-12 MED ORDER — CHLORHEXIDINE GLUCONATE CLOTH 2 % EX PADS
6.0000 | MEDICATED_PAD | Freq: Every day | CUTANEOUS | Status: DC
  Administered 2020-10-12 – 2020-10-15 (×4): 6 via TOPICAL

## 2020-10-12 MED ORDER — ACETAMINOPHEN 500 MG PO TABS
1000.0000 mg | ORAL_TABLET | Freq: Four times a day (QID) | ORAL | Status: DC
  Administered 2020-10-13 – 2020-10-16 (×13): 1000 mg via ORAL
  Filled 2020-10-12 (×16): qty 2

## 2020-10-12 MED ORDER — PRAVASTATIN SODIUM 40 MG PO TABS
80.0000 mg | ORAL_TABLET | Freq: Every evening | ORAL | Status: DC
  Administered 2020-10-13 – 2020-10-15 (×3): 80 mg via ORAL
  Filled 2020-10-12 (×3): qty 2

## 2020-10-12 MED ORDER — FAMOTIDINE IN NACL 20-0.9 MG/50ML-% IV SOLN
20.0000 mg | Freq: Two times a day (BID) | INTRAVENOUS | Status: DC
  Administered 2020-10-12: 20 mg via INTRAVENOUS
  Filled 2020-10-12: qty 50

## 2020-10-12 MED ORDER — DEXMEDETOMIDINE HCL IN NACL 400 MCG/100ML IV SOLN
0.0000 ug/kg/h | INTRAVENOUS | Status: DC
  Administered 2020-10-12: 0.7 ug/kg/h via INTRAVENOUS
  Filled 2020-10-12: qty 100

## 2020-10-12 MED ORDER — EPHEDRINE SULFATE-NACL 50-0.9 MG/10ML-% IV SOSY
PREFILLED_SYRINGE | INTRAVENOUS | Status: DC | PRN
  Administered 2020-10-12 (×3): 10 mg via INTRAVENOUS
  Administered 2020-10-12: 5 mg via INTRAVENOUS
  Administered 2020-10-12: 10 mg via INTRAVENOUS

## 2020-10-12 MED ORDER — ALBUMIN HUMAN 5 % IV SOLN
250.0000 mL | INTRAVENOUS | Status: AC | PRN
Start: 2020-10-12 — End: 2020-10-13
  Administered 2020-10-12 – 2020-10-13 (×4): 12.5 g via INTRAVENOUS
  Filled 2020-10-12: qty 250

## 2020-10-12 MED ORDER — ARTIFICIAL TEARS OPHTHALMIC OINT
TOPICAL_OINTMENT | OPHTHALMIC | Status: AC
  Filled 2020-10-12: qty 3.5

## 2020-10-12 MED ORDER — DEXTROSE 50 % IV SOLN
INTRAVENOUS | Status: AC
  Filled 2020-10-12: qty 50

## 2020-10-12 MED ORDER — PLASMA-LYTE 148 IV SOLN
INTRAVENOUS | Status: DC | PRN
  Administered 2020-10-12: 500 mL via INTRAVASCULAR

## 2020-10-12 MED ORDER — MIDAZOLAM HCL (PF) 5 MG/ML IJ SOLN
INTRAMUSCULAR | Status: DC | PRN
  Administered 2020-10-12 (×4): 1 mg via INTRAVENOUS
  Administered 2020-10-12: 2 mg via INTRAVENOUS

## 2020-10-12 MED ORDER — BUPIVACAINE HCL (PF) 0.5 % IJ SOLN
INTRAMUSCULAR | Status: AC
  Filled 2020-10-12: qty 30

## 2020-10-12 MED ORDER — DEXTROSE 50 % IV SOLN
INTRAVENOUS | Status: DC | PRN
  Administered 2020-10-12: 25 g via INTRAVENOUS

## 2020-10-12 MED ORDER — ACETAMINOPHEN 160 MG/5ML PO SOLN: 650.0000 mg | Freq: Once | ORAL | Status: AC

## 2020-10-12 SURGICAL SUPPLY — 100 items
ADAPTER CARDIO PERF ANTE/RETRO (ADAPTER) ×4 IMPLANT
ADH SKN CLS APL DERMABOND .7 (GAUZE/BANDAGES/DRESSINGS) ×3
ADPR PRFSN 84XANTGRD RTRGD (ADAPTER) ×3
ADPR TBG 2 MALE LL ART (MISCELLANEOUS) ×3
APL SRG 7X2 LUM MLBL SLNT (VASCULAR PRODUCTS)
APPLICATOR TIP COSEAL (VASCULAR PRODUCTS) IMPLANT
APPLIER CLIP 9.375 SM OPEN (CLIP) ×4
APR CLP SM 9.3 20 MLT OPN (CLIP) ×3
BAG DECANTER FOR FLEXI CONT (MISCELLANEOUS) ×4 IMPLANT
BLADE CLIPPER SURG (BLADE) ×4 IMPLANT
BLADE STERNUM SYSTEM 6 (BLADE) ×4 IMPLANT
BLADE SURG 15 STRL LF DISP TIS (BLADE) ×2 IMPLANT
BLADE SURG 15 STRL SS (BLADE)
BNDG ELASTIC 4X5.8 VLCR STR LF (GAUZE/BANDAGES/DRESSINGS) ×4 IMPLANT
BNDG ELASTIC 6X5.8 VLCR STR LF (GAUZE/BANDAGES/DRESSINGS) ×4 IMPLANT
BNDG GAUZE ELAST 4 BULKY (GAUZE/BANDAGES/DRESSINGS) ×4 IMPLANT
CANISTER SUCT 3000ML PPV (MISCELLANEOUS) ×4 IMPLANT
CANNULA NON VENT 20FR 12 (CANNULA) ×2 IMPLANT
CATH CPB KIT HENDRICKSON (MISCELLANEOUS) ×4 IMPLANT
CATH ROBINSON RED A/P 18FR (CATHETERS) ×10 IMPLANT
CLIP APPLIE 9.375 SM OPEN (CLIP) ×3 IMPLANT
CLIP RETRACTION 3.0MM CORONARY (MISCELLANEOUS) ×4 IMPLANT
CLIP VESOCCLUDE MED 24/CT (CLIP) IMPLANT
CLIP VESOCCLUDE SM WIDE 24/CT (CLIP) ×10 IMPLANT
COVER MAYO STAND STRL (DRAPES) ×2 IMPLANT
DERMABOND ADVANCED (GAUZE/BANDAGES/DRESSINGS) ×1
DERMABOND ADVANCED .7 DNX12 (GAUZE/BANDAGES/DRESSINGS) ×3 IMPLANT
DRAIN CHANNEL 28F RND 3/8 FF (WOUND CARE) ×12 IMPLANT
DRAPE CARDIOVASCULAR INCISE (DRAPES) ×4
DRAPE SLUSH/WARMER DISC (DRAPES) ×4 IMPLANT
DRAPE SRG 135X102X78XABS (DRAPES) ×3 IMPLANT
DRSG AQUACEL AG ADV 3.5X14 (GAUZE/BANDAGES/DRESSINGS) ×4 IMPLANT
ELECT CAUTERY BLADE 6.4 (BLADE) ×4 IMPLANT
ELECT REM PT RETURN 9FT ADLT (ELECTROSURGICAL) ×8
ELECTRODE REM PT RTRN 9FT ADLT (ELECTROSURGICAL) ×6 IMPLANT
FELT TEFLON 1X6 (MISCELLANEOUS) ×6 IMPLANT
GAUZE SPONGE 4X4 12PLY STRL (GAUZE/BANDAGES/DRESSINGS) ×8 IMPLANT
GLOVE NEODERM STRL 7.5  LF PF (GLOVE) ×9
GLOVE NEODERM STRL 7.5 LF PF (GLOVE) ×9 IMPLANT
GLOVE SURG NEODERM 7.5  LF PF (GLOVE) ×3
GOWN STRL REUS W/ TWL LRG LVL3 (GOWN DISPOSABLE) ×12 IMPLANT
GOWN STRL REUS W/TWL LRG LVL3 (GOWN DISPOSABLE) ×16
HEMOSTAT POWDER SURGIFOAM 1G (HEMOSTASIS) ×4 IMPLANT
INSERT FOGARTY XLG (MISCELLANEOUS) ×4 IMPLANT
INSERT SUTURE HOLDER (MISCELLANEOUS) ×4 IMPLANT
IV ADAPTER SYR DOUBLE MALE LL (MISCELLANEOUS) ×2 IMPLANT
KIT APPLICATOR RATIO 11:1 (KITS) ×2 IMPLANT
KIT BASIN OR (CUSTOM PROCEDURE TRAY) ×4 IMPLANT
KIT SUCTION CATH 14FR (SUCTIONS) ×4 IMPLANT
KIT TURNOVER KIT B (KITS) ×4 IMPLANT
KIT VASOVIEW HEMOPRO 2 VH 4000 (KITS) ×4 IMPLANT
NDL 18GX1X1/2 (RX/OR ONLY) (NEEDLE) ×2 IMPLANT
NEEDLE 18GX1X1/2 (RX/OR ONLY) (NEEDLE) ×4 IMPLANT
NS IRRIG 1000ML POUR BTL (IV SOLUTION) ×20 IMPLANT
PACK E OPEN HEART (SUTURE) ×4 IMPLANT
PACK OPEN HEART (CUSTOM PROCEDURE TRAY) ×4 IMPLANT
PACK PLATELET PROCEDURE 60 (MISCELLANEOUS) ×2 IMPLANT
PACK SPY-PHI (KITS) ×2 IMPLANT
PAD ARMBOARD 7.5X6 YLW CONV (MISCELLANEOUS) ×8 IMPLANT
PAD ELECT DEFIB RADIOL ZOLL (MISCELLANEOUS) ×4 IMPLANT
PENCIL BUTTON HOLSTER BLD 10FT (ELECTRODE) ×4 IMPLANT
POSITIONER HEAD DONUT 9IN (MISCELLANEOUS) ×4 IMPLANT
POWDER SURGICEL 3.0 GRAM (HEMOSTASIS) ×4 IMPLANT
PUNCH AORTIC ROTATE 4.0MM (MISCELLANEOUS) ×2 IMPLANT
SEALANT SURG COSEAL 8ML (VASCULAR PRODUCTS) ×2 IMPLANT
SET CARDIOPLEGIA MPS 5001102 (MISCELLANEOUS) ×2 IMPLANT
SUT BONE WAX W31G (SUTURE) ×4 IMPLANT
SUT MNCRL AB 3-0 PS2 18 (SUTURE) ×8 IMPLANT
SUT MNCRL AB 4-0 PS2 18 (SUTURE) ×6 IMPLANT
SUT PDS AB 1 CTX 36 (SUTURE) ×8 IMPLANT
SUT PROLENE 3 0 SH DA (SUTURE) ×6 IMPLANT
SUT PROLENE 5 0 C 1 36 (SUTURE) IMPLANT
SUT PROLENE 6 0 C 1 30 (SUTURE) ×18 IMPLANT
SUT PROLENE 7 0 BV 1 (SUTURE) ×2 IMPLANT
SUT PROLENE 8 0 BV175 6 (SUTURE) IMPLANT
SUT PROLENE BLUE 7 0 (SUTURE) ×4 IMPLANT
SUT SILK 2 0 SH CR/8 (SUTURE) IMPLANT
SUT SILK 3 0 SH CR/8 (SUTURE) IMPLANT
SUT STEEL 6MS V (SUTURE) ×4 IMPLANT
SUT STEEL SZ 6 DBL 3X14 BALL (SUTURE) ×4 IMPLANT
SUT VIC AB 2-0 CT1 27 (SUTURE) ×4
SUT VIC AB 2-0 CT1 TAPERPNT 27 (SUTURE) ×1 IMPLANT
SUT VIC AB 2-0 CTX 27 (SUTURE) IMPLANT
SUT VIC AB 3-0 SH 27 (SUTURE)
SUT VIC AB 3-0 SH 27X BRD (SUTURE) IMPLANT
SUT VIC AB 3-0 X1 27 (SUTURE) IMPLANT
SYR 10ML LL (SYRINGE) IMPLANT
SYR 30ML LL (SYRINGE) ×4 IMPLANT
SYR 3ML LL SCALE MARK (SYRINGE) ×4 IMPLANT
SYR 50ML SLIP (SYRINGE) IMPLANT
SYSTEM SAHARA CHEST DRAIN ATS (WOUND CARE) ×4 IMPLANT
TAPE PAPER 3X10 WHT MICROPORE (GAUZE/BANDAGES/DRESSINGS) ×2 IMPLANT
TIP DUAL SPRAY TOPICAL (TIP) ×4 IMPLANT
TOWEL GREEN STERILE (TOWEL DISPOSABLE) ×4 IMPLANT
TOWEL GREEN STERILE FF (TOWEL DISPOSABLE) ×4 IMPLANT
TRAY FOLEY SLVR 16FR TEMP STAT (SET/KITS/TRAYS/PACK) ×4 IMPLANT
TUBING ART PRESS 48 MALE/FEM (TUBING) ×4 IMPLANT
TUBING LAP HI FLOW INSUFFLATIO (TUBING) ×4 IMPLANT
UNDERPAD 30X36 HEAVY ABSORB (UNDERPADS AND DIAPERS) ×4 IMPLANT
WATER STERILE IRR 1000ML POUR (IV SOLUTION) ×8 IMPLANT

## 2020-10-12 NOTE — Progress Notes (Signed)
Dr. Orvan Seen notified of post vent wean ABG. New orders received for 2 amps bicarb and then ok to extubate.   Results for KRESTON, AHRENDT (MRN [id number]) as of 10/12/2020 16:53  Ref. Range 10/12/2020 16:03  Sample type Unknown ARTERIAL  pH, Arterial Latest Ref Range: 7.350 - 7.450  7.329 (L)  pCO2 arterial Latest Ref Range: 32.0 - 48.0 mmHg 37.0  pO2, Arterial Latest Ref Range: 83.0 - 108.0 mmHg 121 (H)  TCO2 Latest Ref Range: 22 - 32 mmol/L 21 (L)  Acid-base deficit Latest Ref Range: 0.0 - 2.0 mmol/L 6.0 (H)  Bicarbonate Latest Ref Range: 20.0 - 28.0 mmol/L 19.5 (L)  O2 Saturation Latest Units: % 99.0  Patient temperature Unknown 36.5 C  Sodium Latest Ref Range: 135 - 145 mmol/L 139  Potassium Latest Ref Range: 3.5 - 5.1 mmol/L 4.5  Calcium Ionized Latest Ref Range: 1.15 - 1.40 mmol/L 1.24  Hemoglobin Latest Ref Range: 13.0 - 17.0 g/dL 9.5 (L)  HCT Latest Ref Range: 39.0 - 52.0 % 28.0 (L)

## 2020-10-12 NOTE — Procedures (Signed)
Extubation Procedure Note  Patient Details:   Name: Mark Elliott DOB: September 21, 1935 MRN: [id number]   Airway Documentation:    Vent end date: 10/12/20 Vent end time: 1642   Evaluation  O2 sats: stable throughout Complications: No apparent complications Patient did tolerate procedure well. Bilateral Breath Sounds: Clear,Diminished   Yes   RT extubated patient to 4L [place] per MD order and rapid wean protocol with RN at bedside. Positive cuff leak noted. NIF -30 and VC 644 MD aware. No stridor noted at this time. RT will continue to monitor as needed.   Fabiola Backer 10/12/2020, 5:37 PM

## 2020-10-12 NOTE — Anesthesia Postprocedure Evaluation (Signed)
Anesthesia Post Note  Patient: Mark Elliott  Procedure(s) Performed: CORONARY ARTERY BYPASS GRAFTING (CABG) TIMES FOUR USING LEFT INTERNAL MAMMARY ARTERY AND RIGHT GREATER SAPHENOUS VEIN HARVESTED ENDOSCOPICALLY (N/A Chest) TRANSESOPHAGEAL ECHOCARDIOGRAM (TEE) (N/A ) INDOCYANINE GREEN FLUORESCENCE IMAGING (ICG) (N/A )     Patient location during evaluation: SICU Anesthesia Type: General Level of consciousness: awake and alert, patient cooperative and oriented Pain management: pain level controlled Vital Signs Assessment: post-procedure vital signs reviewed and stable Respiratory status: nonlabored ventilation, spontaneous breathing, respiratory function stable and patient connected to nasal cannula oxygen Cardiovascular status: blood pressure returned to baseline and stable Postop Assessment: no apparent nausea or vomiting Anesthetic complications: no Comments: Doing well, extubated, hemodynamically stable, comfortable   No complications documented.  Last Vitals:  Vitals:   10/12/20 1645 10/12/20 1700  BP:  114/67  Pulse: 89 89  Resp: 20 (!) 21  Temp: 36.6 C 36.7 C  SpO2: 100% 100%    Last Pain:  Vitals:   10/12/20 1300  TempSrc: Core  PainSc:                  Angles Trevizo,E. Christhoper Busbee

## 2020-10-12 NOTE — Brief Op Note (Signed)
10/12/2020  11:00 AM  PATIENT:  [name]  85 y.o. male  PRE-OPERATIVE DIAGNOSIS:  CAD  POST-OPERATIVE DIAGNOSIS:  CAD  PROCEDURE:  Procedure(s): CORONARY ARTERY BYPASS GRAFTING (CABG) TIMES FOUR USING LEFT INTERNAL MAMMARY ARTERY AND RIGHT GREATER SAPHENOUS VEIN HARVESTED ENDOSCOPICALLY (N/A) TRANSESOPHAGEAL ECHOCARDIOGRAM (TEE) (N/A) INDOCYANINE GREEN FLUORESCENCE IMAGING (ICG) (N/A) EVH 60 MINUTES LIMA-LAD SVG-RPD SVG-OM1 SVG-OM2  SURGEON:  Surgeon(s) and Role:    * Wonda Olds, MD - Primary  PHYSICIAN ASSISTANT: WAYNE GOLD PA-C  ASSISTANTS: STAFF   ANESTHESIA:   general  EBL:  300   BLOOD ADMINISTERED:1 UNIT PRBC'S  DRAINS: LEFT PLEURAL AND MEDIASTINAL CHEST TUBES   LOCAL MEDICATIONS USED:  NONE  SPECIMEN:  No Specimen  DISPOSITION OF SPECIMEN:  N/A  COUNTS:  YES  TOURNIQUET:  * No tourniquets in log *  DICTATION: .Dragon Dictation  PLAN OF CARE: Admit to inpatient   PATIENT DISPOSITION:  ICU - intubated and hemodynamically stable.   Delay start of Pharmacological VTE agent (>24hrs) due to surgical blood loss or risk of bleeding: yes

## 2020-10-12 NOTE — Op Note (Signed)
CARDIOTHORACIC SURGERY OPERATIVE NOTE  Date of Procedure: 10/12/2020  Preoperative Diagnosis: Severe 3-vessel Coronary Artery Disease  Postoperative Diagnosis: Same  Procedure:    Coronary Artery Bypass Grafting x 4  Left Internal Mammary Artery to Distal Left Anterior Descending Coronary Artery; Saphenous Vein Graft to  Posterior Descending Coronary Artery; Saphenous Vein Graft to 1st Obtuse Marginal Branch of Left Circumflex Coronary Artery; Sapheonous Vein Graft to 2nd Obtuse Marginal Branch of Left Circumflex Coronary Artery; Endoscopic Vein Harvest from right thigh and Lower Leg Completion graft surveillance with indocyanine green fluorescence imaging  Surgeon: B. Murvin Natal, MD  Assistant: Evonnie Pat, PA-C  Anesthesia: get  Operative Findings:  preserved left ventricular systolic function  Good quality left internal mammary artery conduit  Good quality saphenous vein conduit  Good quality target vessels for grafting    BRIEF CLINICAL NOTE AND INDICATIONS FOR SURGERY  85 yo man with progressive sx of DOE and fatigue. Work-up showed severe, multivessel CAD. Referred for CABG. He has been thoroughly evaluated and is considered a good candidate for surgery.    DETAILS OF THE OPERATIVE PROCEDURE  Preparation:  The patient is brought to the operating room on the above mentioned date and central monitoring was established by the anesthesia team including placement of Swan-Ganz catheter and radial arterial line. The patient is placed in the supine position on the operating table.  Intravenous antibiotics are administered. General endotracheal anesthesia is induced uneventfully. A Foley catheter is placed.  Baseline transesophageal echocardiogram was performed.  Findings were notable for mildly reduced RV function; preserved LV function  The patient's chest, abdomen, both groins, and both lower extremities are prepared and draped in a sterile manner. A time out procedure is  performed.   Surgical Approach and Conduit Harvest:  A median sternotomy incision was performed and the left internal mammary artery is dissected from the chest wall and prepared for bypass grafting. The left internal mammary artery is notably good quality conduit. Simultaneously, the greater saphenous vein is obtained from the patient's right thigh using endoscopic vein harvest technique. The saphenous vein is notably good quality conduit. After removal of the saphenous vein, the small surgical incisions in the lower extremity are closed with absorbable suture. Following systemic heparinization, the left internal mammary artery was transected distally noted to have excellent flow.   Extracorporeal Cardiopulmonary Bypass and Myocardial Protection:  The pericardium is opened. The ascending aorta is nondiseased in appearance. The ascending aorta and the right atrium are cannulated for cardiopulmonary bypass.  Adequate heparinization is verified.   A retrograde cardioplegia cannula is placed through the right atrium into the coronary sinus.  The entire pre-bypass portion of the operation was notable for stable hemodynamics.  Cardiopulmonary bypass was begun and the surface of the heart is inspected. Distal target vessels are selected for coronary artery bypass grafting. A cardioplegia cannula is placed in the ascending aorta.   The patient is allowed to cool passively to 34C systemic temperature.  The aortic cross clamp is applied and cold blood cardioplegia is delivered initially in an antegrade fashion through the aortic root.  Supplemental cardioplegia is given retrograde through the coronary sinus catheter.  Iced saline slush is applied for topical hypothermia.  The initial cardioplegic arrest is rapid with early diastolic arrest.  Repeat doses of cardioplegia are administered intermittently throughout the entire cross clamp portion of the operation through the aortic root,  through the coronary  sinus catheter, and through subsequently placed vein grafts in order to maintain completely  flat electrocardiogram.   Coronary Artery Bypass Grafting:   The posterior descending branch of the right coronary artery was grafted using a reversed saphenous vein graft in an end-to-side fashion.  At the site of distal anastomosis the target vessel was good quality and measured approximately 1.5 mm in diameter.  The 1st obtuse marginal branch of the left circumflex coronary artery was grafted using a reversed saphenous vein graft in an end-to-side fashion.  At the site of distal anastomosis the target vessel was good  quality and measured approximately 1.5 mm in diameter.  The 2nd obtuse marginal branch of the left circumflex coronary artery was grafted using a reversed saphenous vein graft in an end-to-side fashion.  At the site of distal anastomosis the target vessel was good  quality and measured approximately 1.5 mm in diameter.    The distal left anterior coronary artery was grafted with the left internal mammary artery in an end-to-side fashion.  At the site of distal anastomosis the target vessel was good quality and measured approximately 1.5 mm in diameter. Anastomotic patency and runoff was confirmed with indocyanine green fluorescence imaging (SPY).  All proximal vein graft anastomoses were placed directly to the ascending aorta prior to removal of the aortic cross clamp.  A hot-shot dose of cardioplegia was given, followed by de-airing procedures and the aortic cross clamp was removed   Procedure Completion:  All proximal and distal coronary anastomoses were inspected for hemostasis and appropriate graft orientation. Epicardial pacing wires are fixed to the right ventricular outflow tract and to the right atrial appendage. The patient is rewarmed to 37C temperature. The patient is weaned and disconnected from cardiopulmonary bypass.  The patient's rhythm at separation from bypass was heart  block  The patient was weaned from cardiopulmonary bypass  without any inotropic support.   Followup transesophageal echocardiogram performed after separation from bypass revealed  no changes from the preoperative exam.  The aortic and venous cannula were removed uneventfully. Protamine was administered to reverse the anticoagulation. The mediastinum and pleural space were inspected for hemostasis and irrigated with saline solution. The mediastinum and left pleural space were drained using fluted chest tubes placed through separate stab incisions inferiorly.  The soft tissues anterior to the aorta were reapproximated loosely. The sternum is closed with double strength sternal wire. The soft tissues anterior to the sternum were closed in multiple layers and the skin is closed with a running subcuticular skin closure.  The post-bypass portion of the operation was notable for stable rhythm and hemodynamics.     Disposition:  The patient tolerated the procedure well and is transported to the surgical intensive care in stable condition. There are no intraoperative complications. All sponge instrument and needle counts are verified correct at completion of the operation.    Jayme Cloud, MD 10/12/2020 1:54 PM

## 2020-10-12 NOTE — Anesthesia Procedure Notes (Signed)
Central Venous Catheter Insertion Performed by: Albertha Ghee, MD, anesthesiologist Start/End4/10/2020 6:56 AM, 10/12/2020 6:58 AM Patient location: Pre-op. Preanesthetic checklist: patient identified, IV checked, site marked, risks and benefits discussed, surgical consent, monitors and equipment checked, pre-op evaluation, timeout performed and anesthesia consent Hand hygiene performed  and maximum sterile barriers used  PA cath was placed.Swan type:thermodilution Procedure performed without using ultrasound guided technique. Attempts: 1 Patient tolerated the procedure well with no immediate complications.

## 2020-10-12 NOTE — Anesthesia Procedure Notes (Signed)
Arterial Line Insertion Start/End4/10/2020 6:58 AM, 10/12/2020 7:01 AM Performed by: Albertha Ghee, MD, anesthesiologist  Patient location: Pre-op. Preanesthetic checklist: patient identified, IV checked, site marked, risks and benefits discussed, surgical consent, monitors and equipment checked, pre-op evaluation, timeout performed and anesthesia consent Lidocaine 1% used for infiltration Right, radial was placed Catheter size: 20 Fr Hand hygiene performed  and maximum sterile barriers used   Attempts: 1 Procedure performed using ultrasound guided (no picture taken) technique. Ultrasound Notes:anatomy identified, needle tip was noted to be adjacent to the nerve/plexus identified and no ultrasound evidence of intravascular and/or intraneural injection Following insertion, dressing applied. Post procedure assessment: normal and unchanged

## 2020-10-12 NOTE — Anesthesia Procedure Notes (Signed)
Central Venous Catheter Insertion Performed by: Albertha Ghee, MD, anesthesiologist Start/End4/10/2020 6:46 AM, 10/12/2020 6:56 AM Patient location: Pre-op. Preanesthetic checklist: patient identified, IV checked, site marked, risks and benefits discussed, surgical consent, monitors and equipment checked, pre-op evaluation, timeout performed and anesthesia consent Lidocaine 1% used for infiltration and patient sedated Hand hygiene performed  and maximum sterile barriers used  Catheter size: 8.5 Fr Sheath introducer Procedure performed using ultrasound guided technique. Ultrasound Notes:anatomy identified, needle tip was noted to be adjacent to the nerve/plexus identified, no ultrasound evidence of intravascular and/or intraneural injection and image(s) printed for medical record Attempts: 1 Following insertion, line sutured and dressing applied. Post procedure assessment: blood return through all ports, free fluid flow and no air  Patient tolerated the procedure well with no immediate complications.

## 2020-10-12 NOTE — Transfer of Care (Signed)
Immediate Anesthesia Transfer of Care Note  Patient: Mark Elliott  Procedure(s) Performed: CORONARY ARTERY BYPASS GRAFTING (CABG) TIMES FOUR USING LEFT INTERNAL MAMMARY ARTERY AND RIGHT GREATER SAPHENOUS VEIN HARVESTED ENDOSCOPICALLY (N/A Chest) TRANSESOPHAGEAL ECHOCARDIOGRAM (TEE) (N/A ) INDOCYANINE GREEN FLUORESCENCE IMAGING (ICG) (N/A )  Patient Location: SICU  Anesthesia Type:General  Level of Consciousness: sedated and Patient remains intubated per anesthesia plan  Airway & Oxygen Therapy: Patient remains intubated per anesthesia plan and Patient placed on Ventilator (see vital sign flow sheet for setting)  Post-op Assessment: Report given to RN and Post -op Vital signs reviewed and stable  Post vital signs: Reviewed and stable  Last Vitals:  Vitals Value Taken Time  BP    Temp    Pulse    Resp    SpO2      Last Pain:  Vitals:   10/12/20 0559  TempSrc:   PainSc: 0-No pain         Complications: No complications documented.   Patient transported to ICU with standard monitors (HR, BP, SPO2, RR) and emergency drugs/equipment. Controlled ventilation maintained via ambu bag. Report given to bedside RN and respiratory therapist. Pt connected to ICU monitor and ventilator. All questions answered and vital signs stable before leaving

## 2020-10-12 NOTE — Progress Notes (Signed)
  Echocardiogram Echocardiogram Transesophageal has been performed.  Fidel Levy 10/12/2020, 8:41 AM

## 2020-10-12 NOTE — H&P (Signed)
History and Physical Interval Note:  10/12/2020 7:31 AM  Mark Elliott  has presented today for surgery, with the diagnosis of CAD.  The various methods of treatment have been discussed with the patient and family. After consideration of risks, benefits and other options for treatment, the patient has consented to  Procedure(s) with comments: CORONARY ARTERY BYPASS GRAFTING (CABG) (N/A) - Possible BIMA RADIAL ARTERY HARVEST (Left) TRANSESOPHAGEAL ECHOCARDIOGRAM (TEE) (N/A) INDOCYANINE GREEN FLUORESCENCE IMAGING (ICG) (N/A) as a surgical intervention.  The patient's history has been reviewed, patient examined, no change in status, stable for surgery.  I have reviewed the patient's chart and labs.  Questions were answered to the patient's satisfaction.     Wonda Olds

## 2020-10-12 NOTE — Anesthesia Procedure Notes (Addendum)
Procedure Name: Intubation Date/Time: 10/12/2020 7:52 AM Performed by: Darletta Moll, CRNA Pre-anesthesia Checklist: Patient identified, Emergency Drugs available, Suction available and Patient being monitored Patient Re-evaluated:Patient Re-evaluated prior to induction Oxygen Delivery Method: Circle system utilized Preoxygenation: Pre-oxygenation with 100% oxygen Induction Type: IV induction Ventilation: Mask ventilation without difficulty, Oral airway inserted - appropriate to patient size and Two handed mask ventilation required Laryngoscope Size: Mac and 4 Grade View: Grade I Tube type: Oral Number of attempts: 1 Airway Equipment and Method: Stylet and Oral airway Placement Confirmation: ETT inserted through vocal cords under direct vision,  positive ETCO2 and breath sounds checked- equal and bilateral Secured at: 22 cm Tube secured with: Tape Dental Injury: Teeth and Oropharynx as per pre-operative assessment

## 2020-10-13 ENCOUNTER — Telehealth: Payer: Self-pay

## 2020-10-13 ENCOUNTER — Encounter (HOSPITAL_COMMUNITY): Payer: Self-pay | Admitting: Cardiothoracic Surgery

## 2020-10-13 ENCOUNTER — Inpatient Hospital Stay (HOSPITAL_COMMUNITY): Payer: Medicare Other

## 2020-10-13 LAB — BASIC METABOLIC PANEL
Anion gap: 6 (ref 5–15)
Anion gap: 5 (ref 5–15)
BUN: 14 mg/dL (ref 8–23)
BUN: 15 mg/dL (ref 8–23)
CO2: 23 mmol/L (ref 22–32)
CO2: 24 mmol/L (ref 22–32)
Calcium: 8.2 mg/dL — ABNORMAL LOW (ref 8.9–10.3)
Calcium: 8.4 mg/dL — ABNORMAL LOW (ref 8.9–10.3)
Chloride: 105 mmol/L (ref 98–111)
Chloride: 103 mmol/L (ref 98–111)
Creatinine, Ser: 1.03 mg/dL (ref 0.61–1.24)
Creatinine, Ser: 1.1 mg/dL (ref 0.61–1.24)
GFR, Estimated: >60 mL/min (ref >60)
GFR, Estimated: >60 mL/min (ref >60)
Glucose, Bld: 103 mg/dL — ABNORMAL HIGH (ref 70–99)
Glucose, Bld: 151 mg/dL — ABNORMAL HIGH (ref 70–99)
Potassium: 4.2 mmol/L (ref 3.5–5.1)
Potassium: 4.3 mmol/L (ref 3.5–5.1)
Sodium: 134 mmol/L — ABNORMAL LOW (ref 135–145)
Sodium: 132 mmol/L — ABNORMAL LOW (ref 135–145)

## 2020-10-13 LAB — CBC
HCT: 27.9 % — ABNORMAL LOW (ref 39.0–52.0)
HCT: 29 % — ABNORMAL LOW (ref 39.0–52.0)
Hemoglobin: 9.4 g/dL — ABNORMAL LOW (ref 13.0–17.0)
Hemoglobin: 9.4 g/dL — ABNORMAL LOW (ref 13.0–17.0)
MCH: 30 pg (ref 26.0–34.0)
MCH: 29.5 pg (ref 26.0–34.0)
MCHC: 33.7 g/dL (ref 30.0–36.0)
MCHC: 32.4 g/dL (ref 30.0–36.0)
MCV: 89.1 fL (ref 80.0–100.0)
MCV: 90.9 fL (ref 80.0–100.0)
Platelets: 126 10*3/uL — ABNORMAL LOW (ref 150–400)
Platelets: 141 10*3/uL — ABNORMAL LOW (ref 150–400)
RBC: 3.13 MIL/uL — ABNORMAL LOW (ref 4.22–5.81)
RBC: 3.19 MIL/uL — ABNORMAL LOW (ref 4.22–5.81)
RDW: 13.6 % (ref 11.5–15.5)
RDW: 13.8 % (ref 11.5–15.5)
WBC: 5.6 10*3/uL (ref 4.0–10.5)
WBC: 7.6 10*3/uL (ref 4.0–10.5)
nRBC: 0 % (ref 0.0–0.2)
nRBC: 0 % (ref 0.0–0.2)

## 2020-10-13 LAB — GLUCOSE, CAPILLARY
Glucose-Capillary: 106 mg/dL — ABNORMAL HIGH (ref 70–99)
Glucose-Capillary: 108 mg/dL — ABNORMAL HIGH (ref 70–99)
Glucose-Capillary: 112 mg/dL — ABNORMAL HIGH (ref 70–99)
Glucose-Capillary: 113 mg/dL — ABNORMAL HIGH (ref 70–99)
Glucose-Capillary: 115 mg/dL — ABNORMAL HIGH (ref 70–99)
Glucose-Capillary: 118 mg/dL — ABNORMAL HIGH (ref 70–99)
Glucose-Capillary: 124 mg/dL — ABNORMAL HIGH (ref 70–99)
Glucose-Capillary: 124 mg/dL — ABNORMAL HIGH (ref 70–99)
Glucose-Capillary: 124 mg/dL — ABNORMAL HIGH (ref 70–99)
Glucose-Capillary: 129 mg/dL — ABNORMAL HIGH (ref 70–99)
Glucose-Capillary: 135 mg/dL — ABNORMAL HIGH (ref 70–99)
Glucose-Capillary: 143 mg/dL — ABNORMAL HIGH (ref 70–99)
Glucose-Capillary: 190 mg/dL — ABNORMAL HIGH (ref 70–99)

## 2020-10-13 LAB — MAGNESIUM
Magnesium: 2.1 mg/dL (ref 1.7–2.4)
Magnesium: 2 mg/dL (ref 1.7–2.4)

## 2020-10-13 MED ORDER — INSULIN ASPART 100 UNIT/ML ~~LOC~~ SOLN: 0.0000 [IU] | SUBCUTANEOUS | Status: DC

## 2020-10-13 MED ORDER — THIAMINE HCL 100 MG/ML IJ SOLN
Freq: Once | INTRAVENOUS | Status: AC
  Filled 2020-10-13: qty 1000

## 2020-10-13 MED ORDER — INSULIN GLARGINE 100 UNIT/ML ~~LOC~~ SOLN: 11.0000 [IU] | Freq: Every day | SUBCUTANEOUS | Status: DC

## 2020-10-13 MED ORDER — KETOROLAC TROMETHAMINE 15 MG/ML IJ SOLN
7.5000 mg | Freq: Four times a day (QID) | INTRAMUSCULAR | Status: AC
  Administered 2020-10-13 – 2020-10-14 (×5): 7.5 mg via INTRAVENOUS
  Filled 2020-10-13 (×5): qty 1

## 2020-10-13 MED ORDER — COLCHICINE 0.3 MG HALF TABLET
0.3000 mg | ORAL_TABLET | Freq: Two times a day (BID) | ORAL | Status: DC
  Administered 2020-10-13 – 2020-10-16 (×7): 0.3 mg via ORAL
  Filled 2020-10-13 (×9): qty 1

## 2020-10-13 MED ORDER — INSULIN GLARGINE 100 UNIT/ML ~~LOC~~ SOLN
11.0000 [IU] | Freq: Every day | SUBCUTANEOUS | Status: DC
  Administered 2020-10-13 – 2020-10-16 (×4): 11 [IU] via SUBCUTANEOUS
  Filled 2020-10-13 (×4): qty 0.11

## 2020-10-13 MED ORDER — METOCLOPRAMIDE HCL 5 MG/ML IJ SOLN
5.0000 mg | Freq: Four times a day (QID) | INTRAMUSCULAR | Status: AC
  Administered 2020-10-13 – 2020-10-14 (×4): 5 mg via INTRAVENOUS
  Filled 2020-10-13 (×4): qty 2

## 2020-10-13 MED ORDER — INSULIN ASPART 100 UNIT/ML ~~LOC~~ SOLN
0.0000 [IU] | SUBCUTANEOUS | Status: DC
  Administered 2020-10-13: 1 [IU] via SUBCUTANEOUS
  Administered 2020-10-13: 2 [IU] via SUBCUTANEOUS
  Administered 2020-10-14: 3 [IU] via SUBCUTANEOUS

## 2020-10-13 NOTE — Telephone Encounter (Signed)
Location of hospitalization: [facility] Reason for hospitalization:  Date of discharge:  Date of first communication with patient: today Person contacting patient: Me Current symptoms:  Do you understand why you were in the Hospital: Yes Questions regarding discharge instructions: None Where were you discharged to: Home Medications reviewed: Yes Allergies reviewed: Yes Dietary changes reviewed: Yes. Discussed low fat and low salt diet.  Referals reviewed: NA Activities of Daily Living: Able to with mild limitations Any transportation issues/concerns: None Any patient concerns: None Confirmed importance & date/time of Follow up appt: Yes Confirmed with patient if condition begins to worsen call. Pt was given the office number and encouraged to call back with questions or concerns: Yes   Patient still currently admitted in the hospital : 10/13/2020

## 2020-10-13 NOTE — Hospital Course (Addendum)
  Referring Provider is Alethia Berthold, [place] Primary Cardiologist is No primary care provider on file. PCP is Seward Carol, MD   HPI:   85 year old man is referred for consideration of CABG.  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  For the past several months he has noted increased shortness of breath and fatigue with exertion.  He denies chest pain as such.  Patient underwent left heart catheterization by Dr. Einar Gip in February which demonstrated totally occluded circumflex vessels.  He also had some disease of the right coronary artery.  He was referred for PCI which was attempted last week but this demonstrated in the interim severe proximal LAD disease.  The procedure was aborted and the patient is referred for CABG.  He denies history of stroke or dysrhythmias.  The patient and all relevant studies were reviewed by Dr. Julien Girt who recommended proceeding with CABG.  Hospital course:  Patient was admitted electively and on 10/12/2020 was taken the operating room at which time he underwent CABG x4.  He tolerated the procedure well was taken to the surgical intensive care unit in stable condition.  Postoperative hospital course:  Is doing well.  All drips were discontinued on postop day 1 as well as PA catheter and arterial line.  He was started on a course of gentle diuresis.  He is noted to have an expected acute blood loss anemia and values are stabilizing.  He had a mild postoperative thrombocytopenia which was monitored and normalized prior to discharge.   Renal function has remained within normal limits.  By the second post op day he was ambulating in the unit and progressed well with mobility. He was weaned from supplemental O2 on the second post-op day and transferred to [facility] on POD3.  Diuresis continued with oral Lasix for expected perioperative volume excess.

## 2020-10-13 NOTE — Progress Notes (Signed)
      [address]       [place] [address]             [phone]      POD # 1 CABG  BP (!) 107/92   Pulse 89   Temp 98.42 F (36.9 C)   Resp 18   Ht 5\' 6"  (1.676 m)   Wt 107.3 kg   SpO2 93%   BMI 38.18 kg/m   Intake/Output Summary (Last 24 hours) at 10/13/2020 1847 Last data filed at 10/13/2020 1800 Gross per 24 hour  Intake 2348.92 ml  Output 2065 ml  Net 283.92 ml   CT 290 so far today  K= 4.3, creatinine 1.10 Hct= 29  Doing well POD # 1  Chelsae Zanella C. Roxan Hockey, MD [facility] [phone]

## 2020-10-13 NOTE — Progress Notes (Signed)
Mark Elliott is a 85 y.o. male patient.  1. Coronary artery disease involving native coronary artery of native heart without angina pectoris   2. S/P CABG x 4   3. Surgery follow-up   4. S/P CABG x 4   5. Surgery follow-up   6. History of open heart surgery     Past Medical History:  Diagnosis Date  . Cancer [facility])    Prostate  . Coronary artery disease   . Diabetes mellitus without complication ([place])   . Dyspnea    with exterion  . Hyperlipidemia   . Hypertension     Current Facility-Administered Medications  Medication Dose Route Frequency Provider Last Rate Last Admin  . 0.9 %  sodium chloride infusion   Intravenous Continuous John Giovanni, PA-C 10 mL/hr at 10/12/20 1215 New Bag/Given (Non-Interop) at 10/12/20 1215  . acetaminophen (TYLENOL) tablet 1,000 mg  1,000 mg Oral Q6H Gold, Wayne E, PA-C   1,000 mg at 10/13/20 4098   Or  . acetaminophen (TYLENOL) 160 MG/5ML solution 1,000 mg  1,000 mg Per Tube Q6H Gold, Wayne E, PA-C      . aspirin EC tablet 325 mg  325 mg Oral Daily Gold, Wayne E, PA-C       Or  . aspirin chewable tablet 324 mg  324 mg Per Tube Daily Gold, Wayne E, PA-C      . bisacodyl (DULCOLAX) EC tablet 10 mg  10 mg Oral Daily Gold, Wayne E, PA-C       Or  . bisacodyl (DULCOLAX) suppository 10 mg  10 mg Rectal Daily Gold, Wayne E, PA-C      . cefUROXime (ZINACEF) 1.5 g in sodium chloride 0.9 % 100 mL IVPB  1.5 g Intravenous Q12H Gold, Wayne E, PA-C 200 mL/hr at 10/13/20 0802 1.5 g at 10/13/20 0802  . Chlorhexidine Gluconate Cloth 2 % PADS 6 each  6 each Topical Daily Wonda Olds, MD   6 each at 10/12/20 1510  . colchicine tablet 0.3 mg  0.3 mg Oral BID Ruthell Feigenbaum Z, MD      . dextrose 50 % solution 0-50 mL  0-50 mL Intravenous PRN Gold, Wayne E, PA-C      . docusate sodium (COLACE) capsule 200 mg  200 mg Oral Daily Gold, Wayne E, PA-C      . insulin regular, human (MYXREDLIN) 100 units/ 100 mL infusion   Intravenous Continuous Gold, Wayne E, PA-C  0.8 mL/hr at 10/13/20 0600 Infusion Verify at 10/13/20 0600  . lactated ringers 1,000 mL with thiamine 119 mg, folic acid 1 mg, multivitamins adult 10 mL infusion   Intravenous Once Wonda Olds, MD      . lactated ringers infusion   Intravenous Continuous John Giovanni, PA-C   Stopped at 10/13/20 0540  . lactated ringers infusion   Intravenous Continuous Gold, Wayne E, PA-C 20 mL/hr at 10/13/20 0600 Infusion Verify at 10/13/20 0600  . MEDLINE mouth rinse  15 mL Mouth Rinse BID Wonda Olds, MD   15 mL at 10/12/20 2300  . metoprolol tartrate (LOPRESSOR) tablet 12.5 mg  12.5 mg Oral BID Gold, Wayne E, PA-C       Or  . metoprolol tartrate (LOPRESSOR) 25 mg/10 mL oral suspension 12.5 mg  12.5 mg Per Tube BID Gold, Wayne E, PA-C      . metoprolol tartrate (LOPRESSOR) injection 2.5-5 mg  2.5-5 mg Intravenous Q2H PRN Gold, Wayne E, PA-C      .  morphine 2 MG/ML injection 1-4 mg  1-4 mg Intravenous Q1H PRN Jadene Pierini E, PA-C   3 mg at 10/12/20 2112  . nitroGLYCERIN 50 mg in dextrose 5 % 250 mL (0.2 mg/mL) infusion  0-100 mcg/min Intravenous Titrated Gold, Wayne E, PA-C 3 mL/hr at 10/13/20 0600 10 mcg/min at 10/13/20 0600  . norepinephrine (LEVOPHED) 4mg  in 249mL premix infusion  0-40 mcg/min Intravenous Titrated Wonda Olds, MD   Stopped at 10/12/20 2229  . ondansetron (ZOFRAN) injection 4 mg  4 mg Intravenous Q6H PRN Jadene Pierini E, PA-C   4 mg at 10/13/20 0552  . oxyCODONE (Oxy IR/ROXICODONE) immediate release tablet 5-10 mg  5-10 mg Oral Q3H PRN Gold, Wayne E, PA-C   10 mg at 10/13/20 0800  . [START ON 10/14/2020] pantoprazole (PROTONIX) EC tablet 40 mg  40 mg Oral Daily Gold, Wayne E, PA-C      . pravastatin (PRAVACHOL) tablet 80 mg  80 mg Oral QPM Gold, Wayne E, PA-C      . sodium chloride flush (NS) 0.9 % injection 10-40 mL  10-40 mL Intracatheter Q12H Emmanuell Kantz, Glenice Bow, MD   10 mL at 10/12/20 2200  . sodium chloride flush (NS) 0.9 % injection 10-40 mL  10-40 mL Intracatheter PRN Varnell Donate,  Glenice Bow, MD      . sodium chloride flush (NS) 0.9 % injection 3 mL  3 mL Intravenous Q12H Gold, Wayne E, PA-C      . sodium chloride flush (NS) 0.9 % injection 3 mL  3 mL Intravenous PRN Gold, Wayne E, PA-C      . traMADol (ULTRAM) tablet 50-100 mg  50-100 mg Oral Q4H PRN Gold, Wayne E, PA-C   100 mg at 10/13/20 0529   No Known Allergies Active Problems:   S/P CABG x 4  Blood pressure (!) 124/51, pulse (!) 34, temperature 98.6 F (37 C), resp. rate 17, height 5\' 6"  (1.676 m), weight 107.3 kg, SpO2 92 %.   Physical Exam  Well-appearing, NAD CTA RRR Dressings dry Mild peripheral edema  A/P: doing well Stop drips Gentle resuscitation D/c pa cath oob to chair pulm toilet Pain control  Glenice Bow Nyilah Kight 10/13/2020

## 2020-10-13 NOTE — Progress Notes (Signed)
Inpatient Diabetes Program Recommendations  AACE/ADA: New Consensus Statement on Inpatient Glycemic Control (2015)  Target Ranges:  Prepandial:   less than 140 mg/dL      Peak postprandial:   less than 180 mg/dL (1-2 hours)      Critically ill patients:  140 - 180 mg/dL   Lab Results  Component Value Date   GLUCAP 112 (H) 10/13/2020   HGBA1C 9.0 (H) 10/08/2020     Diabetes history: DM2 Outpatient Diabetes medications: Lantus 16 units daily + Metformin 1000 mg BID Current orders for Inpatient glycemic control: IV Insulin  Transition recommendations:  Lantus 11 units daily-start 1-2 hours prior to DC of IV insulin. Novlog 0-9 units Q4H x 24 Hrs; then TID and 0-5 units QHS  Will continue to follow while inpatient.  Thank you, Reche Dixon, RN, BSN Diabetes Coordinator Inpatient Diabetes Program [phone] (team pager from 8a-5p)

## 2020-10-14 ENCOUNTER — Inpatient Hospital Stay (HOSPITAL_COMMUNITY): Payer: Medicare Other

## 2020-10-14 ENCOUNTER — Other Ambulatory Visit (HOSPITAL_COMMUNITY): Payer: Self-pay

## 2020-10-14 LAB — BASIC METABOLIC PANEL
Anion gap: 5 (ref 5–15)
BUN: 15 mg/dL (ref 8–23)
CO2: 25 mmol/L (ref 22–32)
Calcium: 8.4 mg/dL — ABNORMAL LOW (ref 8.9–10.3)
Chloride: 103 mmol/L (ref 98–111)
Creatinine, Ser: 1.16 mg/dL (ref 0.61–1.24)
GFR, Estimated: >60 mL/min (ref >60)
Glucose, Bld: 83 mg/dL (ref 70–99)
Potassium: 4.1 mmol/L (ref 3.5–5.1)
Sodium: 133 mmol/L — ABNORMAL LOW (ref 135–145)

## 2020-10-14 LAB — CBC
HCT: 27.1 % — ABNORMAL LOW (ref 39.0–52.0)
Hemoglobin: 9 g/dL — ABNORMAL LOW (ref 13.0–17.0)
MCH: 29.9 pg (ref 26.0–34.0)
MCHC: 33.2 g/dL (ref 30.0–36.0)
MCV: 90 fL (ref 80.0–100.0)
Platelets: 124 10*3/uL — ABNORMAL LOW (ref 150–400)
RBC: 3.01 MIL/uL — ABNORMAL LOW (ref 4.22–5.81)
RDW: 13.6 % (ref 11.5–15.5)
WBC: 6.3 10*3/uL (ref 4.0–10.5)
nRBC: 0 % (ref 0.0–0.2)

## 2020-10-14 LAB — GLUCOSE, CAPILLARY
Glucose-Capillary: 111 mg/dL — ABNORMAL HIGH (ref 70–99)
Glucose-Capillary: 215 mg/dL — ABNORMAL HIGH (ref 70–99)
Glucose-Capillary: 220 mg/dL — ABNORMAL HIGH (ref 70–99)
Glucose-Capillary: 223 mg/dL — ABNORMAL HIGH (ref 70–99)
Glucose-Capillary: 79 mg/dL (ref 70–99)

## 2020-10-14 MED ORDER — FUROSEMIDE 10 MG/ML IJ SOLN
40.0000 mg | Freq: Two times a day (BID) | INTRAMUSCULAR | Status: DC
  Administered 2020-10-14 – 2020-10-16 (×5): 40 mg via INTRAVENOUS
  Filled 2020-10-14 (×5): qty 4

## 2020-10-14 MED ORDER — INSULIN ASPART 100 UNIT/ML ~~LOC~~ SOLN
0.0000 [IU] | Freq: Three times a day (TID) | SUBCUTANEOUS | Status: DC
  Administered 2020-10-14 – 2020-10-15 (×5): 3 [IU] via SUBCUTANEOUS
  Administered 2020-10-15: 1 [IU] via SUBCUTANEOUS
  Administered 2020-10-15: 3 [IU] via SUBCUTANEOUS
  Administered 2020-10-16: 2 [IU] via SUBCUTANEOUS
  Administered 2020-10-16: 3 [IU] via SUBCUTANEOUS

## 2020-10-14 NOTE — Plan of Care (Signed)

## 2020-10-14 NOTE — Progress Notes (Addendum)
[address]       [place] [address]             [phone]      2 Days Post-Op Procedure(s) (LRB): CORONARY ARTERY BYPASS GRAFTING (CABG) TIMES FOUR USING LEFT INTERNAL MAMMARY ARTERY AND RIGHT GREATER SAPHENOUS VEIN HARVESTED ENDOSCOPICALLY (N/A) TRANSESOPHAGEAL ECHOCARDIOGRAM (TEE) (N/A) INDOCYANINE GREEN FLUORESCENCE IMAGING (ICG) (N/A) Subjective: Feels pretty well some discomfort from chest tubes  Objective: Vital signs in last 24 hours: Temp:  [97.7 F (36.5 C)-98.6 F (37 C)] 97.7 F (36.5 C) (04/06 0024) Pulse Rate:  [34-92] 88 (04/06 0700) Cardiac Rhythm: Atrial paced (04/06 0000) Resp:  [10-24] 20 (04/06 0700) BP: (105-156)/(48-92) 108/63 (04/06 0700) SpO2:  [91 %-100 %] 99 % (04/06 0700) Arterial Line BP: (127-167)/(46-64) 162/59 (04/05 1400) Weight:  [109.3 kg] 109.3 kg (04/06 0545)  Hemodynamic parameters for last 24 hours: PAP: (36-46)/(14-23) 36/15 CO:  [3.6 L/min] 3.6 L/min CI:  [1.7 L/min/m2] 1.7 L/min/m2  Intake/Output from previous day: 04/05 0701 - 04/06 0700 In: 2127.2 [P.O.:1320; I.V.:607.2; IV Piggyback:200] Out: 1490 [Urine:1010; Chest Tube:480] Intake/Output this shift: Total I/O In: -  Out: 50 [Chest Tube:50]  General appearance: alert, cooperative and no distress Heart: regular rate and rhythm Lungs: min dim in bases Abdomen: benign Extremities: trace edema Wound: dressings CDI  Lab Results: Recent Labs    10/13/20 1703 10/14/20 0429  WBC 7.6 6.3  HGB 9.4* 9.0*  HCT 29.0* 27.1*  PLT 141* 124*   BMET:  Recent Labs    10/13/20 1703 10/14/20 0429  NA 132* 133*  K 4.3 4.1  CL 103 103  CO2 24 25  GLUCOSE 151* 83  BUN 15 15  CREATININE 1.10 1.16  CALCIUM 8.4* 8.4*    PT/INR:  Recent Labs    10/12/20 1229  LABPROT 16.8*  INR 1.4*   ABG    Component Value Date/Time   PHART 7.341 (L) 10/12/2020 1759   HCO3 21.9 10/12/2020 1759   TCO2 23 10/12/2020 1759   ACIDBASEDEF 4.0 (H) 10/12/2020  1759   O2SAT 98.0 10/12/2020 1759   CBG (last 3)  Recent Labs    10/13/20 2028 10/14/20 0023 10/14/20 0428  GLUCAP 129* 111* 79    Meds Scheduled Meds: . acetaminophen  1,000 mg Oral Q6H   Or  . acetaminophen (TYLENOL) oral liquid 160 mg/5 mL  1,000 mg Per Tube Q6H  . aspirin EC  325 mg Oral Daily   Or  . aspirin  324 mg Per Tube Daily  . bisacodyl  10 mg Oral Daily   Or  . bisacodyl  10 mg Rectal Daily  . Chlorhexidine Gluconate Cloth  6 each Topical Daily  . colchicine  0.3 mg Oral BID  . docusate sodium  200 mg Oral Daily  . insulin aspart  0-9 Units Subcutaneous TID AC & HS  . insulin glargine  11 Units Subcutaneous Daily  . ketorolac  7.5 mg Intravenous Q6H  . mouth rinse  15 mL Mouth Rinse BID  . metoCLOPramide (REGLAN) injection  5 mg Intravenous Q6H  . metoprolol tartrate  12.5 mg Oral BID   Or  . metoprolol tartrate  12.5 mg Per Tube BID  . pantoprazole  40 mg Oral Daily  . pravastatin  80 mg Oral QPM  . sodium chloride flush  10-40 mL Intracatheter Q12H  . sodium chloride flush  3 mL Intravenous Q12H   Continuous Infusions: . sodium chloride 10 mL/hr  at 10/12/20 1215  . cefUROXime (ZINACEF)  IV 1.5 g (10/14/20 0749)  . insulin Stopped (10/13/20 1411)  . lactated ringers Stopped (10/13/20 0540)  . lactated ringers 10 mL/hr at 10/14/20 0000  . nitroGLYCERIN Stopped (10/13/20 1702)  . norepinephrine (LEVOPHED) Adult infusion Stopped (10/13/20 0937)   PRN Meds:.dextrose, metoprolol tartrate, morphine injection, ondansetron (ZOFRAN) IV, oxyCODONE, sodium chloride flush, sodium chloride flush, traMADol  Xrays DG Chest Port 1 View  Result Date: 10/14/2020 CLINICAL DATA:  Sore chest. EXAM: PORTABLE CHEST 1 VIEW COMPARISON:  10/13/2020. FINDINGS: Interim removal of Swan-Ganz catheter. Right IJ sheath in stable position. Mediastinal drainage catheter and left chest tubes in stable position. Prior CABG. Cardiomegaly. No pulmonary venous congestion. Persistent left  base atelectasis/infiltrate. Prior cervical spine fusion. IMPRESSION: 1. Interim removal of Swan-Ganz catheter. Right IJ sheath in stable position. Mediastinal drainage catheter and left chest tubes in stable position. No pneumothorax. 2.  Persistent left base atelectasis/infiltrate. Electronically Signed   By: Marcello Moores  Register   On: 10/14/2020 05:56   DG Chest Port 1 View  Result Date: 10/13/2020 CLINICAL DATA:  Chest tube.  Open-heart surgery. EXAM: PORTABLE CHEST 1 VIEW COMPARISON:  10/12/2020. FINDINGS: Interim extubation and removal of NG tube. Swan-Ganz catheter and left chest tubes in stable position. Drainage catheter over the left lower chest/left upper quadrant again noted in unchanged position. Prior CABG. Heart size stable. Low lung volumes with mild bibasilar atelectasis, progressed from prior exam. No pleural effusion or pneumothorax. Prior cervical spine fusion. IMPRESSION: 1. Interim extubation removal of NG tube. Swan-Ganz catheter left chest tube in stable position. Drainage catheter over the left lower chest/left upper quadrant again noted in unchanged position. 2.  Prior CABG.  Heart size stable. 3. Low lung volumes with mild bibasilar atelectasis, progressed from prior exam. Electronically Signed   By: Marcello Moores  Register   On: 10/13/2020 06:06   DG Chest Port 1 View  Result Date: 10/12/2020 CLINICAL DATA:  Status post CABG EXAM: PORTABLE CHEST 1 VIEW COMPARISON:  10/08/2020 FINDINGS: Endotracheal tube terminates 5 cm above the carina. Right IJ Swan-Ganz catheter with its tip in the main pulmonary artery. Left chest tube and mediastinal drain.  No pneumothorax is seen. Lungs are clear.  No pleural effusion or pneumothorax. The heart is normal in size. Postsurgical changes related to prior CABG. Enteric tube terminates in the proximal stomach. Median sternotomy. IMPRESSION: Endotracheal tube terminates 5 cm above the carina. Additional support apparatus as above. Postsurgical changes related  to prior CABG. No pneumothorax. Electronically Signed   By: Julian Hy M.D.   On: 10/12/2020 13:15    Assessment/Plan: S/P Procedure(s) (LRB): CORONARY ARTERY BYPASS GRAFTING (CABG) TIMES FOUR USING LEFT INTERNAL MAMMARY ARTERY AND RIGHT GREATER SAPHENOUS VEIN HARVESTED ENDOSCOPICALLY (N/A) TRANSESOPHAGEAL ECHOCARDIOGRAM (TEE) (N/A) INDOCYANINE GREEN FLUORESCENCE IMAGING (ICG) (N/A)    1 afeb, VSS BP 100-150 range 2 sats good on RA 3 weight 9 kg > preop if accurate, begin diuresis 4 good UOP 5 expected acute blood loss anemia, slightly lower H/H - monitor clinically 6 thrombocytopenia- slightly lower- monitor  7 normal renal fxn 8 good BS control 9 CXR- Persistent left base atelectasis/infiltrate. 10 CT 480 cc . Keep for now 11 pulm toilet/rehab     LOS: 2 days    John Giovanni PA-C Pager [phone] 10/14/2020 Pt seen and examined; agree with documentation of assessment and plan. Analya Louissaint Z. Orvan Seen, [place]

## 2020-10-14 NOTE — TOC Benefit Eligibility Note (Signed)
Patient Teacher, English as a foreign language completed.    The patient is currently admitted and upon discharge could be taking Colchicine 0.6 mg tab.  The current 30 day co-pay is, $47.00.   The patient is insured through [place], [place] Patient Advocate Specialist [place] Team Direct Number: [phone]  Fax: [phone]

## 2020-10-14 NOTE — Progress Notes (Signed)
Inpatient Diabetes Program Recommendations  AACE/ADA: New Consensus Statement on Inpatient Glycemic Control (2015)  Target Ranges:  Prepandial:   less than 140 mg/dL      Peak postprandial:   less than 180 mg/dL (1-2 hours)      Critically ill patients:  140 - 180 mg/dL   Lab Results  Component Value Date   GLUCAP 223 (H) 10/14/2020   HGBA1C 9.0 (H) 10/08/2020    Review of Glycemic Control Results for Mark Elliott, Mark Elliott (MRN [id number]) as of 10/14/2020 13:59  Ref. Range 10/13/2020 16:23 10/13/2020 20:28 10/14/2020 00:23 10/14/2020 04:28 10/14/2020 07:55  Glucose-Capillary Latest Ref Range: 70 - 99 mg/dL 190 (H) 129 (H) 111 (H) 79 223 (H)    Inpatient Diabetes Program Recommendations:    Fasting cbg was 79 mg/dL this morning.  Might consider decreasing to Lantus 8 units QAM.   Will continue to follow while inpatient.  Thank you, Reche Dixon, RN, BSN Diabetes Coordinator Inpatient Diabetes Program [phone] (team pager from 8a-5p)

## 2020-10-15 ENCOUNTER — Ambulatory Visit: Payer: Medicare Other | Admitting: Cardiology

## 2020-10-15 ENCOUNTER — Inpatient Hospital Stay (HOSPITAL_COMMUNITY): Payer: Medicare Other

## 2020-10-15 LAB — BASIC METABOLIC PANEL
Anion gap: 5 (ref 5–15)
BUN: 15 mg/dL (ref 8–23)
CO2: 28 mmol/L (ref 22–32)
Calcium: 8.1 mg/dL — ABNORMAL LOW (ref 8.9–10.3)
Chloride: 102 mmol/L (ref 98–111)
Creatinine, Ser: 1.17 mg/dL (ref 0.61–1.24)
GFR, Estimated: >60 mL/min (ref >60)
Glucose, Bld: 140 mg/dL — ABNORMAL HIGH (ref 70–99)
Potassium: 3.7 mmol/L (ref 3.5–5.1)
Sodium: 135 mmol/L (ref 135–145)

## 2020-10-15 LAB — GLUCOSE, CAPILLARY
Glucose-Capillary: 213 mg/dL — ABNORMAL HIGH (ref 70–99)
Glucose-Capillary: 136 mg/dL — ABNORMAL HIGH (ref 70–99)
Glucose-Capillary: 136 mg/dL — ABNORMAL HIGH (ref 70–99)
Glucose-Capillary: 243 mg/dL — ABNORMAL HIGH (ref 70–99)
Glucose-Capillary: 224 mg/dL — ABNORMAL HIGH (ref 70–99)
Glucose-Capillary: 219 mg/dL — ABNORMAL HIGH (ref 70–99)
Glucose-Capillary: 247 mg/dL — ABNORMAL HIGH (ref 70–99)

## 2020-10-15 LAB — CBC
HCT: 26.3 % — ABNORMAL LOW (ref 39.0–52.0)
Hemoglobin: 8.8 g/dL — ABNORMAL LOW (ref 13.0–17.0)
MCH: 29.8 pg (ref 26.0–34.0)
MCHC: 33.5 g/dL (ref 30.0–36.0)
MCV: 89.2 fL (ref 80.0–100.0)
Platelets: 154 10*3/uL (ref 150–400)
RBC: 2.95 MIL/uL — ABNORMAL LOW (ref 4.22–5.81)
RDW: 13.2 % (ref 11.5–15.5)
WBC: 6.3 10*3/uL (ref 4.0–10.5)
nRBC: 0 % (ref 0.0–0.2)

## 2020-10-15 MED ORDER — LIVING WELL WITH DIABETES BOOK
Freq: Once | Status: AC
  Filled 2020-10-15: qty 1

## 2020-10-15 MED ORDER — POTASSIUM CHLORIDE CRYS ER 20 MEQ PO TBCR
20.0000 meq | EXTENDED_RELEASE_TABLET | ORAL | Status: AC
Start: 2020-10-15 — End: 2020-10-15
  Administered 2020-10-15 (×3): 20 meq via ORAL
  Filled 2020-10-15 (×3): qty 1

## 2020-10-15 NOTE — Progress Notes (Signed)
Patient arrived from [facility] to [id number]. Vital signs obtained patient in chair call bell with in reach. Patient placed on monitor and CCMD made aware. Patient received a bath and CHG bath. Will monitor patient. Javone Ybanez, Bettina Gavia RN

## 2020-10-15 NOTE — Progress Notes (Signed)
CARDIAC REHAB PHASE I   Offered to walk with pt. Pt states recent ambulation. Hopeful for d/c tomorrow. Encouraged continued IS use and walks.   [id number] Rufina Falco, RN BSN 10/15/2020 2:45 PM

## 2020-10-15 NOTE — Progress Notes (Signed)
3 Days Post-Op Procedure(s) (LRB): CORONARY ARTERY BYPASS GRAFTING (CABG) TIMES FOUR USING LEFT INTERNAL MAMMARY ARTERY AND RIGHT GREATER SAPHENOUS VEIN HARVESTED ENDOSCOPICALLY (N/A) TRANSESOPHAGEAL ECHOCARDIOGRAM (TEE) (N/A) INDOCYANINE GREEN FLUORESCENCE IMAGING (ICG) (N/A) Subjective: No complaints  Objective: Vital signs in last 24 hours: Temp:  [97.6 F (36.4 C)-99 F (37.2 C)] 98.9 F (37.2 C) (04/07 0700) Pulse Rate:  [88-97] 97 (04/07 0600) Cardiac Rhythm: Atrial paced (04/07 0400) Resp:  [15-22] 22 (04/07 0600) BP: (78-157)/(44-83) 124/63 (04/07 0500) SpO2:  [98 %-100 %] 100 % (04/07 0600) Weight:  [104.5 kg] 104.5 kg (04/07 0600)  Hemodynamic parameters for last 24 hours:    Intake/Output from previous day: 04/06 0701 - 04/07 0700 In: 1008.3 [P.O.:460; I.V.:438.4; IV Piggyback:109.9] Out: 1610 [Urine:5575; Chest Tube:320] Intake/Output this shift: No intake/output data recorded.  General appearance: alert and cooperative Neurologic: intact Heart: regular rate and rhythm, S1, S2 normal, no murmur, click, rub or gallop Lungs: clear to auscultation bilaterally Abdomen: soft, non-tender; bowel sounds normal; no masses,  no organomegaly Extremities: edema mild Wound: c/d/i  Lab Results: Recent Labs    10/14/20 0429 10/15/20 0500  WBC 6.3 6.3  HGB 9.0* 8.8*  HCT 27.1* 26.3*  PLT 124* 154   BMET:  Recent Labs    10/14/20 0429 10/15/20 0500  NA 133* 135  K 4.1 3.7  CL 103 102  CO2 25 28  GLUCOSE 83 140*  BUN 15 15  CREATININE 1.16 1.17  CALCIUM 8.4* 8.1*    PT/INR:  Recent Labs    10/12/20 1229  LABPROT 16.8*  INR 1.4*   ABG    Component Value Date/Time   PHART 7.341 (L) 10/12/2020 1759   HCO3 21.9 10/12/2020 1759   TCO2 23 10/12/2020 1759   ACIDBASEDEF 4.0 (H) 10/12/2020 1759   O2SAT 98.0 10/12/2020 1759   CBG (last 3)  Recent Labs    10/14/20 2027 10/15/20 0342 10/15/20 0652  GLUCAP 220* 136* 136*    Assessment/Plan: S/P  Procedure(s) (LRB): CORONARY ARTERY BYPASS GRAFTING (CABG) TIMES FOUR USING LEFT INTERNAL MAMMARY ARTERY AND RIGHT GREATER SAPHENOUS VEIN HARVESTED ENDOSCOPICALLY (N/A) TRANSESOPHAGEAL ECHOCARDIOGRAM (TEE) (N/A) INDOCYANINE GREEN FLUORESCENCE IMAGING (ICG) (N/A) Mobilize Diuresis Plan for transfer to step-down: see transfer orders   LOS: 3 days    Wonda Olds 10/15/2020

## 2020-10-15 NOTE — Discharge Instructions (Signed)

## 2020-10-15 NOTE — Telephone Encounter (Signed)
Spoke with patient, he is still currently in the hospital and has not been given a day as to when he will be discharged.  10/15/2020

## 2020-10-15 NOTE — Progress Notes (Addendum)
Inpatient Diabetes Program Recommendations  AACE/ADA: New Consensus Statement on Inpatient Glycemic Control (2015)  Target Ranges:  Prepandial:   less than 140 mg/dL      Peak postprandial:   less than 180 mg/dL (1-2 hours)      Critically ill patients:  140 - 180 mg/dL   Lab Results  Component Value Date   GLUCAP 243 (H) 10/15/2020   HGBA1C 9.0 (H) 10/08/2020    Review of Glycemic Control Results for SIVAN, QUAST (MRN [id number]) as of 10/15/2020 14:54  Ref. Range 10/15/2020 06:52 10/15/2020 11:56  Glucose-Capillary Latest Ref Range: 70 - 99 mg/dL 136 (H) 243 (H)    Inpatient Diabetes Program Recommendations:     Novolog 2 units TID with meals if eats at least 50%  Addendum @ 1513:  Spoke with patient at bedside.  Reviewed patient's current A1c of 9% (average blood sugar of 212 mg/dL). Explained what a A1c is and what it measures.  He is aware this is too elevated.  Also reviewed goal A1c with patient, importance of good glucose control @ home, and blood sugar goals.   He checks his blood sugar every morning and states his readings are usually 120-140's.  He asks how he can bring his A1C down.  Asked patient what he normally drinks; he drinks regular Pepsi on a regular basis.  Encouraged him to switch to diet/zero beverages and limit CHO's.  Reviewed foods that contain CHO's and The Plate Method.  He is current with his PCP.  Denies difficulties obtaining medications.  Also encouraged exercise once medically cleared.  Ordered LWWD booklet.   Will continue to follow while inpatient.  Thank you, Reche Dixon, RN, BSN Diabetes Coordinator Inpatient Diabetes Program [phone] (team pager from 8a-5p)

## 2020-10-15 NOTE — Discharge Summary (Signed)
Physician Discharge Summary  [name] MRN: [id number] DOB/AGE: [birth year] 83 y.o.  Admit date: 10/12/2020 Discharge date: 10/16/2020  Admission [place] [facility]  Discharge Diagnoses:  Active Problems:   S/P CABG x 4  Patient Active Problem List   Diagnosis Date Noted  . S/P CABG x 4 10/12/2020  . CAD (coronary artery disease), native coronary artery 08/18/2020  . Angina pectoris ([place]) 11/15/2016   Past Medical History:  Diagnosis Date  . Cancer [facility])    Prostate  . Coronary artery disease   . Diabetes mellitus without complication ([place])   . Dyspnea    with exterion  . Hyperlipidemia   . Hypertension     HPI:  85 year old man is referred for consideration of CABG.  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  For the past several months he has noted increased shortness of breath and fatigue with exertion.  He denies chest pain as such.  Patient underwent left heart catheterization by Dr. Einar Gip in February which demonstrated totally occluded circumflex vessels.  He also had some disease of the right coronary artery.  He was referred for PCI which was attempted last week but this demonstrated in the interim severe proximal LAD disease.  The procedure was aborted and the patient is referred for CABG.  He denies history of stroke or dysrhythmias.  Referring Provider is Alethia Berthold, [place] Primary Cardiologist is No primary care provider on file. PCP is Seward Carol, MD   HPI:   85 year old man is referred for consideration of CABG.  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  For the past several months he has noted increased shortness of breath and fatigue with exertion.  He denies chest pain as such.  Patient underwent left heart catheterization by Dr. Einar Gip in February which demonstrated totally occluded circumflex vessels.  He also had some disease of the right coronary artery.  He was referred for PCI which was  attempted last week but this demonstrated in the interim severe proximal LAD disease.  The procedure was aborted and the patient is referred for CABG.  He denies history of stroke or dysrhythmias.  The patient and all relevant studies were reviewed by Dr. Julien Girt who recommended proceeding with CABG.  Hospital course:  Patient was admitted electively and on 10/12/2020 was taken the operating room at which time he underwent CABG x4.  He tolerated the procedure well was taken to the surgical intensive care unit in stable condition.  Postoperative hospital course:  Is doing well.  All drips were discontinued on postop day 1 as well as PA catheter and arterial line.  He was started on a course of gentle diuresis.  He is noted to have an expected acute blood loss anemia and values are stabilizing.  He had a mild postoperative thrombocytopenia which was monitored and normalized prior to discharge.   Renal function has remained within normal limits.  By the second post op day he was ambulating in the unit and progressed well with mobility. He was weaned from supplemental O2 on the second post-op day and transferred to 4W Progressive Care on POD3.  Diuresis continued with oral Lasix for expected perioperative volume excess.  He continued to diurese well and now is currently at his baseline weight with no significant findings of edema or heart failure.  He remains in sinus rhythm and hemodynamically stable.  His blood pressure is somewhat elevated and he will  be resumed on his preoperative antihypertensives.  Blood sugars have been under good control and he will also resume his preoperative diabetic medication regimen.  Incisions are noted to be healing well without evidence of infection.  He is tolerating diet.  He is tolerating routine advancement activities using standard protocols.  Oxygen has been weaned and he maintains good saturations on room air.  At the time of discharge the patient is felt to be quite  stable.  Discharged Condition: good  Consults: none  Significant Diagnostic Studies: routine post op labs and serial CXR's  Treatments: surgery:  CARDIOTHORACIC SURGERY OPERATIVE NOTE  Date of Procedure:    10/12/2020  Preoperative Diagnosis:      Severe 3-vessel Coronary Artery Disease  Postoperative Diagnosis:    Same  Procedure:        Coronary Artery Bypass Grafting x 4             Left Internal Mammary Artery to Distal Left Anterior Descending Coronary Artery; Saphenous Vein Graft to  Posterior Descending Coronary Artery; Saphenous Vein Graft to 1st Obtuse Marginal Branch of Left Circumflex Coronary Artery; Sapheonous Vein Graft to 2nd Obtuse Marginal Branch of Left Circumflex Coronary Artery; Endoscopic Vein Harvest from right thigh and Lower Leg Completion graft surveillance with indocyanine green fluorescence imaging  Surgeon:        B. Murvin Natal, MD  Assistant:       Evonnie Pat, PA-C  Anesthesia:    get  Operative Findings: ? preserved left ventricular systolic function ? Good quality left internal mammary artery conduit ? Good quality saphenous vein conduit ? Good quality target vessels for grafting  Discharge Exam: Blood pressure (!) 142/77, pulse 75, temperature 98.4 F (36.9 C), temperature source Oral, resp. rate 19, height 5\' 6"  (1.676 m), weight 101.4 kg, SpO2 97 %.  General appearance: alert, cooperative and no distress Heart: regular rate and rhythm Lungs: clear to auscultation bilaterally Abdomen: benign Extremities: no edema Wound: incis healing well  Disposition:  Discharge disposition: 01-Home or Self Care       Discharge Instructions    Discharge patient   Complete by: As directed    Discharge disposition: 01-Home or Self Care   Discharge patient date: 10/16/2020     Allergies as of 10/16/2020   No Known Allergies     Medication List    STOP taking these medications   aspirin 81 MG tablet Replaced by: aspirin 325 MG EC  tablet   isosorbide mononitrate 60 MG 24 hr tablet Commonly known as: IMDUR   metoprolol succinate 50 MG 24 hr tablet Commonly known as: TOPROL-XL   nitroGLYCERIN 0.4 MG SL tablet Commonly known as: NITROSTAT     TAKE these medications   Accu-Chek Aviva Plus test strip Generic drug: glucose blood Inject 1 application as directed 2 (two) times daily.   amLODipine 5 MG tablet Commonly known as: NORVASC Take 5 mg by mouth daily.   aspirin 325 MG EC tablet Take 1 tablet (325 mg total) by mouth daily. Start taking on: October 17, 2020 Replaces: aspirin 81 MG tablet   colchicine 0.6 MG tablet Take 0.5 tablets (0.3 mg total) by mouth daily. May stop if experiencing muscle pain, nausea or diarrhea   Lantus SoloStar 100 UNIT/ML Solostar Pen Generic drug: insulin glargine Inject 16 Units into the skin daily.   metFORMIN 500 MG 24 hr tablet Commonly known as: GLUCOPHAGE-XR Take 2 tablets (1,000 mg total) by mouth 2 (two) times daily.  metoprolol tartrate 25 MG tablet Commonly known as: LOPRESSOR Take 0.5 tablets (12.5 mg total) by mouth 2 (two) times daily.   pravastatin 80 MG tablet Commonly known as: PRAVACHOL Take 1 tablet (80 mg total) by mouth every evening.   telmisartan 80 MG tablet Commonly known as: MICARDIS Take 80 mg by mouth daily.   traMADol 50 MG tablet Commonly known as: ULTRAM Take 1 tablet (50 mg total) by mouth every 6 (six) hours as needed for moderate pain.       Follow-up Information    Wonda Olds, MD. Go on 10/29/2020.   Specialty: Cardiothoracic Surgery Why: Your appointment is at 4pm. Contact information: [address] [place] [address] [phone]        Alethia Berthold, PA-C. Go on 10/21/2020.   Specialty: Cardiology Why: Your appointment is at 9:00am. Contact information: [place] [address] [phone]             The patient has been discharged on:   1.Beta Blocker:   Yes [  y ]                              No   [   ]                              If No, reason:  2.Ace Inhibitor/ARB: Yes [  y ]                                     No  [    ]                                     If No, reason:  3.Statin:   Yes [ y  ]                  No  [   ]                  If No, reason:  4.Ecasa:  Yes  [ y  ]                  No   [   ]                  If No, reason:   Signed: Wilder Glade Hedaya [place]  PA-C 10/16/2020, 1:20 PM

## 2020-10-15 NOTE — Progress Notes (Signed)
Mobility Specialist: Progress Note   10/15/20 1547  Mobility  Activity Ambulated in hall  Level of Assistance Independent  Assistive Device None  Distance Ambulated (ft) 470 ft  Mobility Response Tolerated well  Mobility performed by Mobility specialist  $Mobility charge 1 Mobility   Pt asx during ambulation. Pt back to bed after walk per request.   Harrell Gave Daegon Deiss Mobility Specialist Mobility Specialist Phone: [phone]

## 2020-10-16 LAB — TYPE AND SCREEN
ABO/RH(D): O POS
Antibody Screen: NEGATIVE
Unit division: 0
Unit division: 0
Unit division: 0
Unit division: 0

## 2020-10-16 LAB — BPAM RBC
Blood Product Expiration Date: 202204262359
Blood Product Expiration Date: 202204262359
Blood Product Expiration Date: 202204292359
Blood Product Expiration Date: 202205042359
ISSUE DATE / TIME: 202204031539
ISSUE DATE / TIME: 202204040933
ISSUE DATE / TIME: 202204040933
Unit Type and Rh: 5100
Unit Type and Rh: 5100
Unit Type and Rh: 5100
Unit Type and Rh: 5100

## 2020-10-16 LAB — LIPID PANEL
Cholesterol: 101 mg/dL (ref 0–200)
HDL: 41 mg/dL (ref >40)
LDL Cholesterol: 45 mg/dL (ref 0–99)
Total CHOL/HDL Ratio: 2.5 RATIO
Triglycerides: 73 mg/dL (ref <150)
VLDL: 15 mg/dL (ref 0–40)

## 2020-10-16 LAB — GLUCOSE, CAPILLARY
Glucose-Capillary: 164 mg/dL — ABNORMAL HIGH (ref 70–99)
Glucose-Capillary: 218 mg/dL — ABNORMAL HIGH (ref 70–99)

## 2020-10-16 MED ORDER — TRAMADOL HCL 50 MG PO TABS: 50.0000 mg | ORAL_TABLET | Freq: Four times a day (QID) | ORAL | 0 refills | Status: DC | PRN

## 2020-10-16 MED ORDER — METOPROLOL TARTRATE 25 MG PO TABS: 12.5000 mg | ORAL_TABLET | Freq: Two times a day (BID) | ORAL | 1 refills | Status: DC

## 2020-10-16 MED ORDER — ASPIRIN 325 MG PO TBEC: 325.0000 mg | DELAYED_RELEASE_TABLET | Freq: Every day | ORAL | Status: DC

## 2020-10-16 MED ORDER — COLCHICINE 0.6 MG PO TABS: 0.3000 mg | ORAL_TABLET | Freq: Every day | ORAL | 0 refills | Status: DC

## 2020-10-16 MED ORDER — AMLODIPINE BESYLATE 5 MG PO TABS
5.0000 mg | ORAL_TABLET | Freq: Every day | ORAL | Status: DC
  Administered 2020-10-16: 5 mg via ORAL
  Filled 2020-10-16: qty 1

## 2020-10-16 MED FILL — Mannitol IV Soln 20%: INTRAVENOUS | Qty: 500 | Status: AC

## 2020-10-16 MED FILL — Electrolyte-R (PH 7.4) Solution: INTRAVENOUS | Qty: 4000 | Status: AC

## 2020-10-16 MED FILL — Calcium Chloride Inj 10%: INTRAVENOUS | Qty: 20 | Status: AC

## 2020-10-16 MED FILL — Albumin, Human Inj 5%: INTRAVENOUS | Qty: 250 | Status: AC

## 2020-10-16 MED FILL — Thrombin For Soln 5000 Unit: CUTANEOUS | Qty: 5000 | Status: AC

## 2020-10-16 MED FILL — Sodium Chloride IV Soln 0.9%: INTRAVENOUS | Qty: 2000 | Status: AC

## 2020-10-16 MED FILL — Heparin Sodium (Porcine) Inj 1000 Unit/ML: INTRAMUSCULAR | Qty: 10 | Status: AC

## 2020-10-16 MED FILL — Sodium Bicarbonate IV Soln 8.4%: INTRAVENOUS | Qty: 50 | Status: AC

## 2020-10-16 MED FILL — Lidocaine HCl Local Soln Prefilled Syringe 100 MG/5ML (2%): INTRAMUSCULAR | Qty: 5 | Status: AC

## 2020-10-16 NOTE — Progress Notes (Signed)
D/c pacing wires per order. Pt tolerated fair. Pacing wires ends intact.   Lavenia Atlas, RN

## 2020-10-16 NOTE — Progress Notes (Signed)
CARDIAC REHAB PHASE I   D/c education completed with pt. Pt educated on importance of site care and monitoring incision daily. Encouraged continued IS use, walks, and sternal precautions. Pt given in-the-tube sheet along with heart healthy and diabetic diets. Reviewed site care, restrictions, and exercise guidelines. Will refer to CRP II [place]. Pt denies DME needs.  [place], RN BSN 10/16/2020 2:25 PM

## 2020-10-16 NOTE — Care Management Important Message (Signed)
Important Message  Patient Details  Name: Mark Elliott MRN: [id number] Date of Birth: Dec 05, 1935   Medicare Important Message Given:  Yes     Shelda Altes 10/16/2020, 11:21 AM

## 2020-10-16 NOTE — Progress Notes (Signed)
[place] [address]       [place] [address]             [phone]      4 Days Post-Op Procedure(s) (LRB): CORONARY ARTERY BYPASS GRAFTING (CABG) TIMES FOUR USING LEFT INTERNAL MAMMARY ARTERY AND RIGHT GREATER SAPHENOUS VEIN HARVESTED ENDOSCOPICALLY (N/A) TRANSESOPHAGEAL ECHOCARDIOGRAM (TEE) (N/A) INDOCYANINE GREEN FLUORESCENCE IMAGING (ICG) (N/A) Subjective: Feels well  Objective: Vital signs in last 24 hours: Temp:  [97.3 F (36.3 C)-98.7 F (37.1 C)] 98.5 F (36.9 C) (04/08 0710) Pulse Rate:  [68-83] 78 (04/08 0710) Cardiac Rhythm: Normal sinus rhythm (04/07 2200) Resp:  [14-20] 19 (04/08 0710) BP: (83-148)/(47-97) 148/69 (04/08 0710) SpO2:  [95 %-100 %] 97 % (04/08 0710) Weight:  [101.4 kg] 101.4 kg (04/08 0422)  Hemodynamic parameters for last 24 hours:    Intake/Output from previous day: 04/07 0701 - 04/08 0700 In: 480 [P.O.:480] Out: 1950 [Urine:1950] Intake/Output this shift: No intake/output data recorded.  General appearance: alert, cooperative and no distress Heart: regular rate and rhythm Lungs: clear to auscultation bilaterally Abdomen: benign Extremities: no edema Wound: incis healing well  Lab Results: Recent Labs    10/14/20 0429 10/15/20 0500  WBC 6.3 6.3  HGB 9.0* 8.8*  HCT 27.1* 26.3*  PLT 124* 154   BMET:  Recent Labs    10/14/20 0429 10/15/20 0500  NA 133* 135  K 4.1 3.7  CL 103 102  CO2 25 28  GLUCOSE 83 140*  BUN 15 15  CREATININE 1.16 1.17  CALCIUM 8.4* 8.1*    PT/INR: No results for input(s): LABPROT, INR in the last 72 hours. ABG    Component Value Date/Time   PHART 7.341 (L) 10/12/2020 1759   HCO3 21.9 10/12/2020 1759   TCO2 23 10/12/2020 1759   ACIDBASEDEF 4.0 (H) 10/12/2020 1759   O2SAT 98.0 10/12/2020 1759   CBG (last 3)  Recent Labs    10/15/20 1941 10/15/20 2206 10/16/20 0606  GLUCAP 219* 247* 164*    Meds Scheduled Meds: . acetaminophen  1,000 mg Oral Q6H   Or  .  acetaminophen (TYLENOL) oral liquid 160 mg/5 mL  1,000 mg Per Tube Q6H  . aspirin EC  325 mg Oral Daily   Or  . aspirin  324 mg Per Tube Daily  . bisacodyl  10 mg Oral Daily   Or  . bisacodyl  10 mg Rectal Daily  . Chlorhexidine Gluconate Cloth  6 each Topical Daily  . colchicine  0.3 mg Oral BID  . docusate sodium  200 mg Oral Daily  . furosemide  40 mg Intravenous BID  . insulin aspart  0-9 Units Subcutaneous TID AC & HS  . insulin glargine  11 Units Subcutaneous Daily  . metoprolol tartrate  12.5 mg Oral BID   Or  . metoprolol tartrate  12.5 mg Per Tube BID  . pantoprazole  40 mg Oral Daily  . pravastatin  80 mg Oral QPM   Continuous Infusions: . insulin Stopped (10/13/20 1411)   PRN Meds:.dextrose, metoprolol tartrate, ondansetron (ZOFRAN) IV, oxyCODONE, sodium chloride flush, sodium chloride flush, traMADol  Xrays DG Chest 1 View  Result Date: 10/15/2020 CLINICAL DATA:  Sore chest.  Open-heart surgery. EXAM: CHEST  1 VIEW COMPARISON:  10/14/2020. FINDINGS: Right IJ sheath and left chest tubes in stable position. Prior CABG. Heart size stable. Mild left base subsegmental atelectasis. No pleural effusion or pneumothorax. Prior cervical spine fusion. IMPRESSION: 1.  Right IJ sheath  and left chest tubes in stable position. 2.  Prior CABG.  Heart size stable. 3.  Mild left base subsegmental atelectasis. Electronically Signed   By: Marcello Moores  Register   On: 10/15/2020 07:29    Assessment/Plan: S/P Procedure(s) (LRB): CORONARY ARTERY BYPASS GRAFTING (CABG) TIMES FOUR USING LEFT INTERNAL MAMMARY ARTERY AND RIGHT GREATER SAPHENOUS VEIN HARVESTED ENDOSCOPICALLY (N/A) TRANSESOPHAGEAL ECHOCARDIOGRAM (TEE) (N/A) INDOCYANINE GREEN FLUORESCENCE IMAGING (ICG) (N/A)  1 afeb, VSS, SBP 80's to 140's, most recent in upper 225'O systolic - will resume norvasc and possibly micardis at d/c  2 sats good on RA 3 no new labs 4 sugars ok, resume home meds at d/c 5 d/c epw's now 6 poss home later this  afternoon if no new issues    LOS: 4 days    John Giovanni PA-C Pager [phone] 10/16/2020

## 2020-10-16 NOTE — Progress Notes (Signed)
D/c tele and IVs. Went over AVS with pt and wife and all questions were answered.   Lavenia Atlas, RN

## 2020-10-16 NOTE — Plan of Care (Signed)
  Problem: Activity: Goal: Risk for activity intolerance will decrease Outcome: Progressing   

## 2020-10-16 NOTE — Progress Notes (Addendum)
Inpatient Diabetes Program Recommendations  AACE/ADA: New Consensus Statement on Inpatient Glycemic Control (2015)  Target Ranges:  Prepandial:   less than 140 mg/dL      Peak postprandial:   less than 180 mg/dL (1-2 hours)      Critically ill patients:  140 - 180 mg/dL   Lab Results  Component Value Date   GLUCAP 164 (H) 10/16/2020   HGBA1C 9.0 (H) 10/08/2020    Review of Glycemic Control  Diabetes history: DM 2 Outpatient Diabetes medications: Lantus 16 units Daily, Metformin 1000 mg bid Current orders for Inpatient glycemic control:  Lantus 11 units Novolog 0-9 units tid + hs  Inpatient Diabetes Program Recommendations:    -  Increase Lantus to 13-14 units  May need to add Novolog meal coverage if postprandials increase after meal intake.  Thanks,  Tama Headings RN, MSN, BC-ADM Inpatient Diabetes Coordinator Team Pager [phone] (8a-5p)

## 2020-10-16 NOTE — Progress Notes (Signed)
CARDIAC REHAB PHASE I   Went to complete education with pt. Pt requesting wife be present, states she is coming in later today. Pt waiting on EPWs to be d/c'd and hoping to discharge later. Will f/u.  Rufina Falco, RN BSN 10/16/2020 11:21 AM

## 2020-10-19 ENCOUNTER — Ambulatory Visit: Payer: Medicare Other | Admitting: Cardiology

## 2020-10-19 ENCOUNTER — Ambulatory Visit: Payer: Medicare Other | Admitting: Student

## 2020-10-19 MED FILL — Heparin Sodium (Porcine) Inj 1000 Unit/ML: INTRAMUSCULAR | Qty: 30 | Status: AC

## 2020-10-19 MED FILL — Potassium Chloride Inj 2 mEq/ML: INTRAVENOUS | Qty: 60 | Status: AC

## 2020-10-19 MED FILL — Magnesium Sulfate Inj 50%: INTRAMUSCULAR | Qty: 10 | Status: AC

## 2020-10-20 ENCOUNTER — Emergency Department (HOSPITAL_COMMUNITY)
Admission: EM | Admit: 2020-10-20 | Discharge: 2020-10-20 | Disposition: A | Discharge status: Home / Self Care | Payer: Medicare Other | Attending: Emergency Medicine | Admitting: Emergency Medicine

## 2020-10-20 ENCOUNTER — Emergency Department (HOSPITAL_COMMUNITY): Payer: Medicare Other

## 2020-10-20 ENCOUNTER — Other Ambulatory Visit: Payer: Self-pay

## 2020-10-20 DIAGNOSIS — R55 Syncope and collapse: Secondary | ICD-10-CM

## 2020-10-20 DIAGNOSIS — Z7982 Long term (current) use of aspirin: Secondary | ICD-10-CM | POA: Diagnosis not present

## 2020-10-20 DIAGNOSIS — J90 Pleural effusion, not elsewhere classified: Secondary | ICD-10-CM | POA: Diagnosis not present

## 2020-10-20 DIAGNOSIS — R6889 Other general symptoms and signs: Secondary | ICD-10-CM | POA: Diagnosis not present

## 2020-10-20 DIAGNOSIS — E1165 Type 2 diabetes mellitus with hyperglycemia: Secondary | ICD-10-CM | POA: Diagnosis not present

## 2020-10-20 DIAGNOSIS — Z743 Need for continuous supervision: Secondary | ICD-10-CM | POA: Diagnosis not present

## 2020-10-20 DIAGNOSIS — W07XXXA Fall from chair, initial encounter: Secondary | ICD-10-CM | POA: Diagnosis not present

## 2020-10-20 DIAGNOSIS — Z87891 Personal history of nicotine dependence: Secondary | ICD-10-CM | POA: Diagnosis not present

## 2020-10-20 DIAGNOSIS — R0602 Shortness of breath: Secondary | ICD-10-CM | POA: Insufficient documentation

## 2020-10-20 DIAGNOSIS — S8992XA Unspecified injury of left lower leg, initial encounter: Secondary | ICD-10-CM | POA: Diagnosis present

## 2020-10-20 DIAGNOSIS — S8012XA Contusion of left lower leg, initial encounter: Secondary | ICD-10-CM | POA: Insufficient documentation

## 2020-10-20 DIAGNOSIS — Y92009 Unspecified place in unspecified non-institutional (private) residence as the place of occurrence of the external cause: Secondary | ICD-10-CM | POA: Diagnosis not present

## 2020-10-20 DIAGNOSIS — Z7984 Long term (current) use of oral hypoglycemic drugs: Secondary | ICD-10-CM | POA: Insufficient documentation

## 2020-10-20 DIAGNOSIS — I119 Hypertensive heart disease without heart failure: Secondary | ICD-10-CM | POA: Diagnosis not present

## 2020-10-20 DIAGNOSIS — Z794 Long term (current) use of insulin: Secondary | ICD-10-CM | POA: Diagnosis not present

## 2020-10-20 DIAGNOSIS — Z8546 Personal history of malignant neoplasm of prostate: Secondary | ICD-10-CM | POA: Diagnosis not present

## 2020-10-20 DIAGNOSIS — J9811 Atelectasis: Secondary | ICD-10-CM | POA: Diagnosis not present

## 2020-10-20 DIAGNOSIS — Z79899 Other long term (current) drug therapy: Secondary | ICD-10-CM | POA: Insufficient documentation

## 2020-10-20 DIAGNOSIS — Z951 Presence of aortocoronary bypass graft: Secondary | ICD-10-CM | POA: Diagnosis not present

## 2020-10-20 DIAGNOSIS — I959 Hypotension, unspecified: Secondary | ICD-10-CM | POA: Diagnosis not present

## 2020-10-20 DIAGNOSIS — I2510 Atherosclerotic heart disease of native coronary artery without angina pectoris: Secondary | ICD-10-CM | POA: Insufficient documentation

## 2020-10-20 DIAGNOSIS — R404 Transient alteration of awareness: Secondary | ICD-10-CM | POA: Diagnosis not present

## 2020-10-20 DIAGNOSIS — T699XXA Effect of reduced temperature, unspecified, initial encounter: Secondary | ICD-10-CM | POA: Diagnosis not present

## 2020-10-20 DIAGNOSIS — E119 Type 2 diabetes mellitus without complications: Secondary | ICD-10-CM | POA: Insufficient documentation

## 2020-10-20 DIAGNOSIS — Z20822 Contact with and (suspected) exposure to covid-19: Secondary | ICD-10-CM | POA: Diagnosis not present

## 2020-10-20 LAB — CBC WITH DIFFERENTIAL/PLATELET
Abs Immature Granulocytes: 0.05 10*3/uL (ref 0.00–0.07)
Basophils Absolute: 0 10*3/uL (ref 0.0–0.1)
Basophils Relative: 0 %
Eosinophils Absolute: 0.1 10*3/uL (ref 0.0–0.5)
Eosinophils Relative: 1 %
HCT: 30.8 % — ABNORMAL LOW (ref 39.0–52.0)
Hemoglobin: 10 g/dL — ABNORMAL LOW (ref 13.0–17.0)
Immature Granulocytes: 1 %
Lymphocytes Relative: 15 %
Lymphs Abs: 1.1 10*3/uL (ref 0.7–4.0)
MCH: 29.9 pg (ref 26.0–34.0)
MCHC: 32.5 g/dL (ref 30.0–36.0)
MCV: 91.9 fL (ref 80.0–100.0)
Monocytes Absolute: 0.6 10*3/uL (ref 0.1–1.0)
Monocytes Relative: 8 %
Neutro Abs: 5.8 10*3/uL (ref 1.7–7.7)
Neutrophils Relative %: 75 %
Platelets: 398 10*3/uL (ref 150–400)
RBC: 3.35 MIL/uL — ABNORMAL LOW (ref 4.22–5.81)
RDW: 13.5 % (ref 11.5–15.5)
WBC: 7.7 10*3/uL (ref 4.0–10.5)
nRBC: 0 % (ref 0.0–0.2)

## 2020-10-20 LAB — COMPREHENSIVE METABOLIC PANEL
ALT: 16 U/L (ref 0–44)
AST: 16 U/L (ref 15–41)
Albumin: 3 g/dL — ABNORMAL LOW (ref 3.5–5.0)
Alkaline Phosphatase: 48 U/L (ref 38–126)
Anion gap: 7 (ref 5–15)
BUN: 14 mg/dL (ref 8–23)
CO2: 25 mmol/L (ref 22–32)
Calcium: 8.8 mg/dL — ABNORMAL LOW (ref 8.9–10.3)
Chloride: 101 mmol/L (ref 98–111)
Creatinine, Ser: 1.2 mg/dL (ref 0.61–1.24)
GFR, Estimated: >60 mL/min (ref >60)
Glucose, Bld: 269 mg/dL — ABNORMAL HIGH (ref 70–99)
Potassium: 5.1 mmol/L (ref 3.5–5.1)
Sodium: 133 mmol/L — ABNORMAL LOW (ref 135–145)
Total Bilirubin: 1 mg/dL (ref 0.3–1.2)
Total Protein: 5.8 g/dL — ABNORMAL LOW (ref 6.5–8.1)

## 2020-10-20 LAB — RESP PANEL BY RT-PCR (FLU A&B, COVID) ARPGX2
Influenza A by PCR: NEGATIVE
Influenza B by PCR: NEGATIVE
SARS Coronavirus 2 by RT PCR: NEGATIVE

## 2020-10-20 LAB — TROPONIN I (HIGH SENSITIVITY)
Troponin I (High Sensitivity): 42 ng/L — ABNORMAL HIGH (ref <18)
Troponin I (High Sensitivity): 42 ng/L — ABNORMAL HIGH (ref <18)

## 2020-10-20 MED ORDER — SODIUM CHLORIDE 0.9 % IV BOLUS
500.0000 mL | Freq: Once | INTRAVENOUS | Status: AC
  Administered 2020-10-20: 500 mL via INTRAVENOUS

## 2020-10-20 NOTE — Progress Notes (Deleted)
Primary Physician/Referring:  Seward Carol, MD  [name], male    DOB: January 04, 1936, 85 y.o.   MRN: [id number]  No chief complaint on file.  HPI:    Mark Elliott  is a 85 y.o. African-American male with history of hypertension, hyperlipidemia, diabetes mellitus, remote history of prostate cancer, obesity.Coronary angiography on 11/15/2016 had revealed no high-grade stenosis but moderate disease in multiple vessels.  Repeat coronary angiography on 08/18/2020 by Dr. Einar Gip revealed occlusion of the ostial LCx, attempted PCI was unsuccessful.  Patient therefore underwent repeat coronary angiography by Dr. Martinique 09/30/2020 revealing severe multivessel CAD, therefore PCI was not attempted.  Patient was then referred to cardiothoracic surgery for evaluation of CABG. Patient underwent elective CABG x4 By Dr. Orvan Seen on 10/12/2020.  ***Patient presents for follow-up after CABG on 10/12/2020.  ***  Patient presents for 6 week follow up of angina pectoris, hypertension, and results of cardiac testing. At last visit added Ranexa 500 mg twice daily. Patient reports since starting Ranexa his anginal symptoms have improved, however he continues to experience exertional chest pain and dyspnea regularly. Denies orthopnea, PND, leg swelling. He has not been monitoring his blood pressure at home.   Past Medical History:  Diagnosis Date  . Cancer [facility])    Prostate  . Coronary artery disease   . Diabetes mellitus without complication ([place])   . Dyspnea    with exterion  . Hyperlipidemia   . Hypertension    Past Surgical History:  Procedure Laterality Date  . ANTERIOR FUSION CERVICAL SPINE    . CORONARY ANGIOGRAPHY N/A 09/30/2020   Procedure: CORONARY ANGIOGRAPHY;  Surgeon: Martinique, Peter M, MD;  Location: [place] [facility];  Service: Cardiovascular;  Laterality: N/A;  . CORONARY ARTERY BYPASS GRAFT N/A 10/12/2020   Procedure: CORONARY ARTERY BYPASS GRAFTING (CABG) TIMES FOUR USING LEFT  INTERNAL MAMMARY ARTERY AND RIGHT GREATER SAPHENOUS VEIN HARVESTED ENDOSCOPICALLY;  Surgeon: Wonda Olds, MD;  Location: [place];  Service: Open Heart Surgery;  Laterality: N/A;  . HERNIA REPAIR    . INTRAVASCULAR PRESSURE WIRE/FFR STUDY N/A 09/30/2020   Procedure: INTRAVASCULAR PRESSURE WIRE/FFR STUDY;  Surgeon: Martinique, Peter M, MD;  Location: [place] [facility];  Service: Cardiovascular;  Laterality: N/A;  . LEFT HEART CATH AND CORONARY ANGIOGRAPHY N/A 11/15/2016   Procedure: Left Heart Cath and Coronary Angiography;  Surgeon: [name], MD;  Location: [place] [facility];  Service: Cardiovascular;  Laterality: N/A;  . LEFT HEART CATH AND CORONARY ANGIOGRAPHY N/A 08/18/2020   Procedure: LEFT HEART CATH AND CORONARY ANGIOGRAPHY;  Surgeon: [name], MD;  Location: [place] [facility];  Service: Cardiovascular;  Laterality: N/A;  . PROSTATE ABLATION    . ROBOT ASSISTED LAPAROSCOPIC RADICAL PROSTATECTOMY  06/13/2007  . TEE WITHOUT CARDIOVERSION N/A 10/12/2020   Procedure: TRANSESOPHAGEAL ECHOCARDIOGRAM (TEE);  Surgeon: Wonda Olds, MD;  Location: [place];  Service: Open Heart Surgery;  Laterality: N/A;   Family History  Problem Relation Age of Onset  . Cancer Father        liver  . Cancer Brother        pancreatic    Social History   Tobacco Use  . Smoking status: Former Smoker    Years: 5.00    Quit date: 02/13/1974    Years since quitting: 46.7  . Smokeless tobacco: Never Used  Substance Use Topics  . Alcohol use: No  Marital Status: Married   ROS  Review of Systems  Constitutional:  Negative for weight gain.  Cardiovascular: Positive for chest pain and dyspnea on exertion (chronic). Negative for claudication, leg swelling, near-syncope, orthopnea, palpitations, paroxysmal nocturnal dyspnea and syncope.  Respiratory: Negative for shortness of breath.   Hematologic/Lymphatic: Does not bruise/bleed easily.  Gastrointestinal: Negative for melena.  Neurological: Negative for  dizziness and weakness.   Objective  There were no vitals taken for this visit.  Vitals with BMI 10/16/2020 10/16/2020 10/16/2020  Height - - -  Weight - - -  BMI - - -  Systolic 287 681 157  Diastolic 91 91 67  Pulse - - -     Physical Exam Vitals reviewed.  HENT:     Head: Normocephalic and atraumatic.  Cardiovascular:     Rate and Rhythm: Normal rate and regular rhythm.     Pulses:          Carotid pulses are 2+ on the right side and 2+ on the left side with bruit.      Radial pulses are 2+ on the right side and 2+ on the left side.       Dorsalis pedis pulses are 1+ on the right side and 1+ on the left side.       Posterior tibial pulses are 1+ on the right side and 1+ on the left side.     Heart sounds: Normal heart sounds, S1 normal and S2 normal. No murmur heard. No gallop.      Comments: Trace bilateral lower leg edema. No JVD. Pulmonary:     Effort: Pulmonary effort is normal. No respiratory distress.     Breath sounds: Normal breath sounds. No wheezing, rhonchi or rales.  Abdominal:     General: Bowel sounds are normal.     Palpations: Abdomen is soft.  Musculoskeletal:     Right lower leg: No edema.     Left lower leg: No edema.  Neurological:     Mental Status: He is alert.    Laboratory examination:   Recent Labs    08/10/20 0811 08/18/20 0659 10/13/20 1703 10/14/20 0429 10/15/20 0500  NA 133*   < > 132* 133* 135  K 5.6*   < > 4.3 4.1 3.7  CL 98   < > 103 103 102  CO2 23   < > '24 25 28  ' GLUCOSE 191*   < > 151* 83 140*  BUN 21   < > '15 15 15  ' CREATININE 1.39*   < > 1.10 1.16 1.17  CALCIUM 10.0   < > 8.4* 8.4* 8.1*  GFRNONAA 47*   < > >60 >60 >60  GFRAA 54*  --   --   --   --    < > = values in this interval not displayed.   estimated creatinine clearance is 54.3 mL/min (by C-G formula based on SCr of 1.17 mg/dL).  CMP Latest Ref Rng & Units 10/15/2020 10/14/2020 10/13/2020  Glucose 70 - 99 mg/dL 140(H) 83 151(H)  BUN 8 - 23 mg/dL '15 15 15  ' Creatinine  0.61 - 1.24 mg/dL 1.17 1.16 1.10  Sodium 135 - 145 mmol/L 135 133(L) 132(L)  Potassium 3.5 - 5.1 mmol/L 3.7 4.1 4.3  Chloride 98 - 111 mmol/L 102 103 103  CO2 22 - 32 mmol/L '28 25 24  ' Calcium 8.9 - 10.3 mg/dL 8.1(L) 8.4(L) 8.4(L)  Total Protein 6.5 - 8.1 g/dL - - -  Total Bilirubin 0.3 - 1.2 mg/dL - - -  Alkaline Phos 38 - 126  U/L - - -  AST 15 - 41 U/L - - -  ALT 0 - 44 U/L - - -   CBC Latest Ref Rng & Units 10/15/2020 10/14/2020 10/13/2020  WBC 4.0 - 10.5 K/uL 6.3 6.3 7.6  Hemoglobin 13.0 - 17.0 g/dL 8.8(L) 9.0(L) 9.4(L)  Hematocrit 39.0 - 52.0 % 26.3(L) 27.1(L) 29.0(L)  Platelets 150 - 400 K/uL 154 124(L) 141(L)   Lipid Panel     Component Value Date/Time   CHOL 101 10/16/2020 0232   TRIG 73 10/16/2020 0232   HDL 41 10/16/2020 0232   CHOLHDL 2.5 10/16/2020 0232   VLDL 15 10/16/2020 0232   LDLCALC 45 10/16/2020 0232   HEMOGLOBIN A1C Lab Results  Component Value Date   HGBA1C 9.0 (H) 10/08/2020   MPG 211.6 10/08/2020   TSH No results for input(s): TSH in the last 8760 hours.  External labs:  05/28/2020: HDL 53, LDL 71, total cholesterol 138, triglycerides 64 A1c 8.0% BUN 19, creatinine 1.22, EGFR 60 TSH 1.93  Labs 08/19/2019: A1c 8.7%.  TSH normal. Serum glucose 55 mg, BUN 18, creatinine 1.0, EGFR >60 mL, potassium 4.0.  CMP otherwise normal. Total cholesterol 140, triglycerides 51, HDL 55, LDL 74.  Non-HDL cholesterol 85.  03/08/2018: TSH 2.26.  Hemoglobin 13.0, CBC otherwise normal.  Hemoglobin A1c 8.5%.  Potassium 4.4, glucose 123, creatinine 1.09, EGFR 65/79, CMP otherwise normal.  Cholesterol 138, triglycerides 43, HDL 56, LDL 73.  Medications and allergies  No Known Allergies   Current Outpatient Medications  Medication Instructions  . ACCU-CHEK AVIVA PLUS test strip 1 application, Injection, 2 times daily  . amLODipine (NORVASC) 5 mg, Oral, Daily  . aspirin 325 mg, Oral, Daily  . colchicine 0.3 mg, Oral, Daily, May stop if experiencing muscle pain, nausea  or diarrhea  . Lantus SoloStar 16 Units, Subcutaneous, Daily  . metFORMIN (GLUCOPHAGE-XR) 1,000 mg, Oral, 2 times daily  . metoprolol tartrate (LOPRESSOR) 12.5 mg, Oral, 2 times daily  . pravastatin (PRAVACHOL) 80 mg, Oral, Every evening  . telmisartan (MICARDIS) 80 mg, Oral, Daily  . traMADol (ULTRAM) 50 mg, Oral, Every 6 hours PRN   Radiology:   No results found.  Cardiac Studies:   Coronary Artery Bypass Grafting x 4 10/12/2020: Left Internal Mammary Artery to Distal Left Anterior Descending Coronary Artery;  Saphenous Vein Graft to  Posterior Descending Coronary Artery;  Saphenous Vein Graft to 1st Obtuse Marginal Branch of Left Circumflex Coronary Artery;  Sapheonous Vein Graft to 2nd Obtuse Marginal Branch of Left Circumflex Coronary Artery;  Endoscopic Vein Harvest from right thigh and Lower Leg  Coronary Angiography 09/30/2020:   Ost LM to Mid LM lesion is 40% stenosed.  Ost LAD to Prox LAD lesion is 75% stenosed.  Ost Cx to Prox Cx lesion is 100% stenosed.   1. Severe multivessel CAD with CTO of the ostial LCx and significant proximal LAD disease by iFR of 0.71  Plan: consider CABG   Left Heart Catheterization 08/18/20: LV: Normal LV systolic function. Normal EDP. No pressure gradient across the aortic valve. Left main: Very short. Mildly calcified. LAD: Large caliber vessel. Has moderate diffuse coronary calcification. Proximal segment has a 30 to 35% stenosis and mid to distal segment is diffusely diseased and focal 30% stenosis in the distal segment. No significant change from prior angiography 2018. Circumflex: Very large caliber vessel and a large vessel. Heavily calcified and flush occluded at the ostium. Faint TIMI I flow is evident distally. Collaterals are evident from the RCA  to the circumflex. RCA: Dominant. Mild to moderate disease in the proximal segment with calcification. Tortuous. Distal right has calcific 60% stenosis, again not significantly changed  from prior angiography in 2018. Collaterals are noted to the circumflex. Attempted angioplasty and recommendations: In spite of aggressive attempted angioplasty, failed attempt. I will refer the patient to Dr. Peter Martinique to see whether she would be a candidate for attempted revascularization of the CTO, he has gracefully reviewed the angiograms and is willing to see the patient in the outpatient basis first. 85 mL contrast utilized. He will be discharged home with outpatient follow-up.  PCV ECHOCARDIOGRAM COMPLETE 80/09/4915 Normal LV systolic function with visual EF 55-60%. Left ventricle cavity is normal in size. Mild left ventricular hypertrophy. Normal global wall motion. Indeterminate diastolic filling pattern, normal LAP. Mild (Grade I) mitral regurgitation. Mild tricuspid regurgitation. Compared to prior study dated 09/13/2017 no significant change.  PCV MYOCARDIAL PERFUSION WO LEXISCAN 06/29/2020 Exercise nuclear stress test was performed using Bruce protocol. Patient reached 7.2 METS, and 86% of age predicted maximum heart rate. Exercise capacity was low. No chest pain reported. Heart rate and hemodynamic response were normal. Peak stress EKG showed sinus tachycardia, RBBB, nonspecific T wave changes inferior leads. SPECT images showed large sized, severe intensity, mid to basal, predominantly reversible perfusion defect in anterolateral, inferolateral myocardium. Intermediate risk study.  Carotid artery duplex 10/25/2019:  Diffuse homogeneous plaque in bilateral common carotid arteries with <50%  stenosis.  Bilateral external carotid stenosis <50%.  Antegrade bilateral vertebral flow.  Compared to 03/21/2018, left ICA stenosis of 50-69% not present.  Follow up studies when clinically indicated.  See enclosed images.  Echocardiogram 09/13/2017: Left ventricle cavity is normal in size. Mild concentric hypertrophy of the left ventricle. Normal global wall motion. Doppler evidence  of grade I (impaired) diastolic dysfunction, normal LAP. Calculated EF 68%. Mild tricuspid regurgitation. Estimated pulmonary artery systolic pressure 24 mmHg.  Coronary angiogram 11/15/2016: Diffuse coronary calcification involving all the coronary arteries. Very short left main with mild calcification.  D1 ostial 60-70% stenosis.  Mid ramus intermediate 60% stenosis. Otherwise moderate scattered disease other vessels. Normal LVEF, EF estimated at 60%.  EKG   ***  EKG 06/17/2020: Sinus rhythm at a rate of 61 bpm.  Normal axis.  Right bundle branch block. No evidence of ischemia. Compared to 09/20/2019, no significant change.   EKG 09/20/2019: Normal sinus rhythm with rate of 57 bpm, normal axis, right bundle branch block.  No evidence of ischemia.   EKG 03/30/2018: Normal sinus rhythm at rate of 63 bpm, left axis deviation, left anterior fascicular block.  Right bundle branch block.  No evidence of ischemia.   Assessment     ICD-10-CM   1. Coronary artery disease of native artery of native heart with stable angina pectoris S/P CABG x4  I25.118   2. Primary hypertension  I10   3. Hypercholesteremia  E78.00   4. Angina pectoris ([place])  I20.9      No orders of the defined types were placed in this encounter.   There are no discontinued medications.  Recommendations:   Mark Elliott  is a  85 y.o. African-American male with history of hypertension, hyperlipidemia, diabetes mellitus, remote history of prostate cancer, obesity.Coronary angiography on 11/15/2016 had revealed no high-grade stenosis but moderate disease in multiple vessels.  Repeat coronary angiography on 08/18/2020 by Dr. Einar Gip revealed occlusion of the ostial LCx, attempted PCI was unsuccessful.  Patient therefore underwent repeat coronary angiography by Dr. Martinique 09/30/2020  revealing severe multivessel CAD, therefore PCI was not attempted.  Patient was then referred to cardiothoracic surgery for evaluation of CABG. Patient  underwent elective CABG x4 By Dr. Orvan Seen on 10/12/2020.  ***Patient presents for follow-up after CABG on 10/12/2020.  ***  ***  Patient presents for 6 week follow up of angina and dyspnea on exertion. Angina symptoms have improved however patient continues to have frequent episodes of exertional chest pain and dyspnea. Reviewed and discussed results of echocardiogram and nuclear stress test with patient and his wife. Nuclear stress test revealed nonspecific T wave changes inferior leads with stress and large sized, severe intensity, mid to basal, predominantly reversible perfusion defect in anterolateral, inferolateral myocardium. In view of ongoing classic symptoms and stress test findings concerning for ischemia recommend cardiac catheretization. The heart catheterization procedure was explained to the patient in detail. The indication, alternatives, risks and benefits were reviewed. Complications including but not limited to bleeding, infection, acute kidney injury, blood transfusion, heart rhythm disturbances, contrast (dye) reaction, damage to the arteries or nerves in the legs or hands, cerebrovascular accident, myocardial infarction, need for emergent bypass surgery, blood clots in the legs, possible need for emergent blood transfusion, and rarely death were reviewed and discussed with the patient. The patient voices understanding and wishes to proceed.  In regard to hypertension, blood pressure is uncontrolled. Will increased amlodipine from 5 mg to 10 mg daily. Patient will monitor his blood pressure daily at home and bring a log with him to his next visit.   Follow up in 6 week for hypertension and angina following cardiac catheterization.    Alethia Berthold, PA-C 10/20/2020, 12:08 PM Office: [phone]

## 2020-10-20 NOTE — Discharge Instructions (Addendum)
Please return for any problem.  °

## 2020-10-20 NOTE — ED Provider Notes (Signed)
[facility] EMERGENCY DEPARTMENT Provider Note  CSN: [id number] Arrival date & time: 10/20/20 1201    History Chief Complaint  Patient presents with  . Hypotension    HPI  Mark Elliott is a 85 y.o. male is POD #12 from 4 vessel CABG. Was doing well post-op and has been home since 4/7. He reports today while getting his hair cut at home he had sudden onset of dizziness/lightheadedness and slumped over onto his barber who kept him from falling out of his chair. He was not having any SOB, chest pain or palpitations at that time but he did have some racing heart earlier this morning after getting up and going to the bathroom, brushing his teeth, etc that resolved after lying back down. He has had some mild SOB since discharge but no cough or fever. He had some nausea and constipation yesterday but took some Miralax last night and had 2 soft stools during the night/morning. He did no pass all the way out but felt like he was getting close. EMS was called and reported his BP was low, he was given ~350cc NS enroute with improvement. He has been taking his BP meds as prescribed at discharge.    Past Medical History:  Diagnosis Date  . Cancer [facility])    Prostate  . Coronary artery disease   . Diabetes mellitus without complication ([place])   . Dyspnea    with exterion  . Hyperlipidemia   . Hypertension     Past Surgical History:  Procedure Laterality Date  . ANTERIOR FUSION CERVICAL SPINE    . CORONARY ANGIOGRAPHY N/A 09/30/2020   Procedure: CORONARY ANGIOGRAPHY;  Surgeon: Martinique, Peter M, MD;  Location: [facility];  Service: Cardiovascular;  Laterality: N/A;  . CORONARY ARTERY BYPASS GRAFT N/A 10/12/2020   Procedure: CORONARY ARTERY BYPASS GRAFTING (CABG) TIMES FOUR USING LEFT INTERNAL MAMMARY ARTERY AND RIGHT GREATER SAPHENOUS VEIN HARVESTED ENDOSCOPICALLY;  Surgeon: Wonda Olds, MD;  Location: [facility];  Service: Open Heart Surgery;  Laterality: N/A;  . HERNIA REPAIR    .  INTRAVASCULAR PRESSURE WIRE/FFR STUDY N/A 09/30/2020   Procedure: INTRAVASCULAR PRESSURE WIRE/FFR STUDY;  Surgeon: Martinique, Peter M, MD;  Location: [facility];  Service: Cardiovascular;  Laterality: N/A;  . LEFT HEART CATH AND CORONARY ANGIOGRAPHY N/A 11/15/2016   Procedure: Left Heart Cath and Coronary Angiography;  Surgeon: [name], MD;  Location: [facility];  Service: Cardiovascular;  Laterality: N/A;  . LEFT HEART CATH AND CORONARY ANGIOGRAPHY N/A 08/18/2020   Procedure: LEFT HEART CATH AND CORONARY ANGIOGRAPHY;  Surgeon: [name], MD;  Location: [facility];  Service: Cardiovascular;  Laterality: N/A;  . PROSTATE ABLATION    . ROBOT ASSISTED LAPAROSCOPIC RADICAL PROSTATECTOMY  06/13/2007  . TEE WITHOUT CARDIOVERSION N/A 10/12/2020   Procedure: TRANSESOPHAGEAL ECHOCARDIOGRAM (TEE);  Surgeon: Wonda Olds, MD;  Location: [facility];  Service: Open Heart Surgery;  Laterality: N/A;    Family History  Problem Relation Age of Onset  . Cancer Father        liver  . Cancer Brother        pancreatic    Social History   Tobacco Use  . Smoking status: Former Smoker    Years: 5.00    Quit date: 02/13/1974    Years since quitting: 46.7  . Smokeless tobacco: Never Used  Vaping Use  . Vaping Use: Never used  Substance Use Topics  . Alcohol use: No  .  Drug use: No     Home Medications Prior to Admission medications   Medication Sig Start Date End Date Taking? Authorizing Provider  ACCU-CHEK AVIVA PLUS test strip Inject 1 application as directed 2 (two) times daily. 12/01/15   [provider]  amLODipine (NORVASC) 5 MG tablet Take 5 mg by mouth daily.    [provider]  aspirin EC 325 MG EC tablet Take 1 tablet (325 mg total) by mouth daily. 10/17/20   John Giovanni, PA-C  colchicine 0.6 MG tablet Take 0.5 tablets (0.3 mg total) by mouth daily. May stop if experiencing muscle pain, nausea or diarrhea 10/16/20   Gold, Wayne E, PA-C  LANTUS SOLOSTAR 100  UNIT/ML Solostar Pen Inject 16 Units into the skin daily. 01/11/16   [provider]  metFORMIN (GLUCOPHAGE-XR) 500 MG 24 hr tablet Take 2 tablets (1,000 mg total) by mouth 2 (two) times daily. 10/02/20   Martinique, Peter M, MD  metoprolol tartrate (LOPRESSOR) 25 MG tablet Take 0.5 tablets (12.5 mg total) by mouth 2 (two) times daily. 10/16/20   Gold, Wayne E, PA-C  pravastatin (PRAVACHOL) 80 MG tablet Take 1 tablet (80 mg total) by mouth every evening. 09/11/20 09/06/21  Martinique, Peter M, MD  telmisartan (MICARDIS) 80 MG tablet Take 80 mg by mouth daily.    [provider]  traMADol (ULTRAM) 50 MG tablet Take 1 tablet (50 mg total) by mouth every 6 (six) hours as needed for moderate pain. 10/16/20   John Giovanni, PA-C     Allergies    Patient has no known allergies.   Review of Systems   Review of Systems A comprehensive review of systems was completed and negative except as noted in HPI.    Physical Exam BP (!) 112/54   Pulse 77   Temp 99 F (37.2 C) (Oral)   Resp (!) 26   Ht 5\' 6"  (1.676 m)   Wt 104.3 kg   SpO2 95%   BMI 37.12 kg/m   Physical Exam Vitals and nursing note reviewed.  Constitutional:      Appearance: Normal appearance.  HENT:     Head: Normocephalic and atraumatic.     Nose: Nose normal.     Mouth/Throat:     Mouth: Mucous membranes are moist.  Eyes:     Extraocular Movements: Extraocular movements intact.     Conjunctiva/sclera: Conjunctivae normal.  Cardiovascular:     Rate and Rhythm: Normal rate.  Pulmonary:     Effort: Pulmonary effort is normal.     Breath sounds: Normal breath sounds.     Comments: Sternotomy incision is CDI, no signs of infection Chest:     Chest wall: Tenderness present.  Abdominal:     General: Abdomen is flat.     Palpations: Abdomen is soft.     Tenderness: There is no abdominal tenderness.  Musculoskeletal:        General: No swelling. Normal range of motion.     Cervical back: Neck supple.     Comments:  Bruising of LE from vein harvest site  Skin:    General: Skin is warm and dry.  Neurological:     General: No focal deficit present.     Mental Status: He is alert.  Psychiatric:        Mood and Affect: Mood normal.      ED Results / Procedures / Treatments   Labs (all labs ordered are listed, but only abnormal results are displayed)  Labs Reviewed  COMPREHENSIVE METABOLIC PANEL - Abnormal; Notable for the following components:      Result Value   Sodium 133 (*)    Glucose, Bld 269 (*)    Calcium 8.8 (*)    Total Protein 5.8 (*)    Albumin 3.0 (*)    All other components within normal limits  CBC WITH DIFFERENTIAL/PLATELET - Abnormal; Notable for the following components:   RBC 3.35 (*)    Hemoglobin 10.0 (*)    HCT 30.8 (*)    All other components within normal limits  TROPONIN I (HIGH SENSITIVITY) - Abnormal; Notable for the following components:   Troponin I (High Sensitivity) 42 (*)    All other components within normal limits  TROPONIN I (HIGH SENSITIVITY)    EKG EKG Interpretation  Date/Time:  Tuesday October 20 2020 12:02:50 EDT Ventricular Rate:  63 PR Interval:  140 QRS Duration: 147 QT Interval:  464 QTC Calculation: 475 R Axis:   -50 Text Interpretation: Sinus rhythm Right bundle branch block Abnormal ekg No significant change since last tracing Confirmed by Calvert Cantor [phone]) on 10/20/2020 12:07:30 PM   Radiology DG Chest 2 View  Result Date: 10/20/2020 CLINICAL DATA:  Syncope. EXAM: CHEST - 2 VIEW COMPARISON:  October 15, 2020. FINDINGS: The heart size and mediastinal contours are within normal limits. Sternotomy wires are noted. No pneumothorax is noted. Right lung is clear. Small left pleural effusion is noted with probable associated subsegmental atelectasis. The visualized skeletal structures are unremarkable. IMPRESSION: Small left pleural effusion is noted with probable associated subsegmental atelectasis. Electronically Signed   By: Marijo Conception M.D.   On: 10/20/2020 13:26    Procedures Procedures  Medications Ordered in the ED Medications  sodium chloride 0.9 % bolus 500 mL (0 mLs Intravenous Stopped 10/20/20 1522)     MDM Rules/Calculators/A&P MDM Patient asymptomatic at the time of my evaluation, although BP is trending down. Will check labs, CXR and give some additional gently hydration.   ED Course  I have reviewed the triage vital signs and the nursing notes.  Pertinent labs & imaging results that were available during my care of the patient were reviewed by me and considered in my medical decision making (see chart for details).  Clinical Course as of 10/20/20 1600  Tue Oct 20, 2020  1330 CXR with small effusion, no signs of acute CHF.  [CS]  5170 CBC with improving Hgb from recent discharge.  [CS]  1600 Care of the patient signed out to Dr. Francia Greaves at change of shift.  [CS]    Clinical Course User Index [CS] Truddie Hidden, MD    Final Clinical Impression(s) / ED Diagnoses Final diagnoses:  None    Rx / DC Orders ED Discharge Orders    None       Truddie Hidden, MD 10/20/20 [id number]

## 2020-10-20 NOTE — ED Notes (Addendum)
Patient reports that he was constipated and took miralax this morning. After his bowel movement standing up made him extremely dizzy. Patient is diabetic and did not eat today.

## 2020-10-20 NOTE — ED Triage Notes (Signed)
Pt came via EMS due to near syncope and hypotension. Pt had a CABG x4 8 days ago. Pt last BP 113/59. EMS gave 350 of fluid. Pt is axox4. Pt usually has hypertension. Pt states he feels like his normal self right now.

## 2020-10-20 NOTE — ED Provider Notes (Signed)
Patient seen after prior ED provider.  Patient's ED work-up was without significant abnormality.  Patient feels improved.   He -- and his wife at bedside -- declined admission.  They desire to go home.  Patient's reported near syncopal episode may have been associated with straining during BM.  Patient has corrected this problem with use of MiraLAX at home.  Patient does understand need for close follow-up.  Strict return precautions given understood.     Valarie Merino, MD 10/20/20 1810

## 2020-10-20 NOTE — ED Notes (Signed)
Patient is resting comfortably. Given crackers, cheese and soda at this time.

## 2020-10-21 ENCOUNTER — Ambulatory Visit: Payer: Medicare Other | Admitting: Student

## 2020-10-21 DIAGNOSIS — E7800 Pure hypercholesterolemia, unspecified: Secondary | ICD-10-CM

## 2020-10-21 DIAGNOSIS — I10 Essential (primary) hypertension: Secondary | ICD-10-CM

## 2020-10-21 DIAGNOSIS — I209 Angina pectoris, unspecified: Secondary | ICD-10-CM

## 2020-10-21 DIAGNOSIS — I25118 Atherosclerotic heart disease of native coronary artery with other forms of angina pectoris: Secondary | ICD-10-CM

## 2020-10-25 ENCOUNTER — Other Ambulatory Visit: Payer: Self-pay | Admitting: Cardiology

## 2020-10-26 ENCOUNTER — Telehealth (HOSPITAL_COMMUNITY): Payer: Self-pay

## 2020-10-26 NOTE — Telephone Encounter (Signed)
Pt insurance is active and benefits verified through [employer]. Co-pay $0.00, DED $0.00/$0.00 met, out of pocket $3,600.00/$923.84 met, co-insurance 0%. No pre-authorization required. Passport, 10/26/20 @ 3:21PM, REF#[id number]  Will contact patient to see if he is interested in the Cardiac Rehab Program. If interested, patient will need to complete follow up appt. Once completed, patient will be contacted for scheduling upon review by the RN Navigator. 

## 2020-10-26 NOTE — Telephone Encounter (Signed)
Attempted to call patient in regards to Cardiac Rehab - unable to leave VM 

## 2020-10-28 ENCOUNTER — Other Ambulatory Visit: Payer: Self-pay | Admitting: Cardiothoracic Surgery

## 2020-10-28 DIAGNOSIS — Z951 Presence of aortocoronary bypass graft: Secondary | ICD-10-CM

## 2020-10-29 ENCOUNTER — Ambulatory Visit (INDEPENDENT_AMBULATORY_CARE_PROVIDER_SITE_OTHER): Payer: Self-pay | Admitting: Cardiothoracic Surgery

## 2020-10-29 ENCOUNTER — Ambulatory Visit
Admission: RE | Admit: 2020-10-29 | Discharge: 2020-10-29 | Disposition: A | Discharge status: Home / Self Care | Payer: Medicare Other | Source: Ambulatory Visit | Attending: Cardiothoracic Surgery | Admitting: Cardiothoracic Surgery

## 2020-10-29 ENCOUNTER — Other Ambulatory Visit: Payer: Self-pay

## 2020-10-29 VITALS — BP 102/71 | HR 71 | Resp 20 | Ht 66.0 in | Wt 226.0 lb

## 2020-10-29 DIAGNOSIS — Z951 Presence of aortocoronary bypass graft: Secondary | ICD-10-CM | POA: Diagnosis not present

## 2020-10-29 DIAGNOSIS — J90 Pleural effusion, not elsewhere classified: Secondary | ICD-10-CM | POA: Diagnosis not present

## 2020-10-29 DIAGNOSIS — I2510 Atherosclerotic heart disease of native coronary artery without angina pectoris: Secondary | ICD-10-CM

## 2020-11-02 DIAGNOSIS — I25110 Atherosclerotic heart disease of native coronary artery with unstable angina pectoris: Secondary | ICD-10-CM | POA: Diagnosis not present

## 2020-11-02 DIAGNOSIS — Z951 Presence of aortocoronary bypass graft: Secondary | ICD-10-CM | POA: Diagnosis not present

## 2020-11-02 DIAGNOSIS — I10 Essential (primary) hypertension: Secondary | ICD-10-CM | POA: Diagnosis not present

## 2020-11-02 DIAGNOSIS — Z794 Long term (current) use of insulin: Secondary | ICD-10-CM | POA: Diagnosis not present

## 2020-11-02 DIAGNOSIS — R55 Syncope and collapse: Secondary | ICD-10-CM | POA: Diagnosis not present

## 2020-11-02 DIAGNOSIS — E1169 Type 2 diabetes mellitus with other specified complication: Secondary | ICD-10-CM | POA: Diagnosis not present

## 2020-11-02 DIAGNOSIS — E7800 Pure hypercholesterolemia, unspecified: Secondary | ICD-10-CM | POA: Diagnosis not present

## 2020-11-03 ENCOUNTER — Other Ambulatory Visit: Payer: Self-pay

## 2020-11-03 ENCOUNTER — Encounter: Payer: Self-pay | Admitting: Student

## 2020-11-03 ENCOUNTER — Ambulatory Visit: Payer: Medicare Other | Admitting: Student

## 2020-11-03 VITALS — BP 134/67 | HR 63 | Temp 97.2°F | Ht 66.0 in | Wt 216.0 lb

## 2020-11-03 DIAGNOSIS — E119 Type 2 diabetes mellitus without complications: Secondary | ICD-10-CM

## 2020-11-03 DIAGNOSIS — I10 Essential (primary) hypertension: Secondary | ICD-10-CM | POA: Diagnosis not present

## 2020-11-03 DIAGNOSIS — I25118 Atherosclerotic heart disease of native coronary artery with other forms of angina pectoris: Secondary | ICD-10-CM

## 2020-11-03 DIAGNOSIS — E7800 Pure hypercholesterolemia, unspecified: Secondary | ICD-10-CM | POA: Diagnosis not present

## 2020-11-03 DIAGNOSIS — Z951 Presence of aortocoronary bypass graft: Secondary | ICD-10-CM | POA: Diagnosis not present

## 2020-11-03 NOTE — Progress Notes (Signed)
Primary Physician/Referring:  Seward Carol, MD  [name], male    DOB: 1935-11-07, 85 y.o.   MRN: [id number]  Chief Complaint  Patient presents with  . Coronary Artery Disease  . Hospitalization Follow-up  . Fatigue   HPI:    Mark Elliott  is a 85 y.o. African-American male with history of hypertension, hyperlipidemia, diabetes mellitus, remote history of prostate cancer, obesity.Coronary angiography on 11/15/2016 had revealed no high-grade stenosis but moderate disease in multiple vessels.  Repeat coronary angiography on 08/18/2020 by Dr. Einar Gip revealed occlusion of the ostial LCx, attempted PCI was unsuccessful.  Patient therefore underwent repeat coronary angiography by Dr. Martinique 09/30/2020 revealing severe multivessel CAD, therefore PCI was not attempted.  Patient was then referred to cardiothoracic surgery for evaluation of CABG. Patient underwent elective CABG x4 By Dr. Orvan Seen on 10/12/2020.  Patient presents for follow-up after CABG on 10/12/2020. Patient is feeling well overall. He has been slowly increasing his activity around the house. He is anxious to get back to work, however Dr. Orvan Seen has advised patient not drive for about 2-3 weeks and not return to work until June 2022. Since surgery patient was seen in the emergency department with orthostatic hypotension. He brings with him a log of home blood pressure readings. Patient has occasional readings >130/80 mmHg, but average is well controlled.   Denies chest pain, dyspnea, palpitations, dizziness, syncope. Denies orthopnea, PND. He does have mild swelling of right lower leg as well as tenderness along sternotomy incision.   Past Medical History:  Diagnosis Date  . Cancer [facility])    Prostate  . Coronary artery disease   . Diabetes mellitus without complication ([place])   . Dyspnea    with exterion  . Hyperlipidemia   . Hypertension    Past Surgical History:  Procedure Laterality Date  . ANTERIOR FUSION  CERVICAL SPINE    . CORONARY ANGIOGRAPHY N/A 09/30/2020   Procedure: CORONARY ANGIOGRAPHY;  Surgeon: Martinique, Peter M, MD;  Location: [place] [facility];  Service: Cardiovascular;  Laterality: N/A;  . CORONARY ARTERY BYPASS GRAFT N/A 10/12/2020   Procedure: CORONARY ARTERY BYPASS GRAFTING (CABG) TIMES FOUR USING LEFT INTERNAL MAMMARY ARTERY AND RIGHT GREATER SAPHENOUS VEIN HARVESTED ENDOSCOPICALLY;  Surgeon: Wonda Olds, MD;  Location: [place];  Service: Open Heart Surgery;  Laterality: N/A;  . HERNIA REPAIR    . INTRAVASCULAR PRESSURE WIRE/FFR STUDY N/A 09/30/2020   Procedure: INTRAVASCULAR PRESSURE WIRE/FFR STUDY;  Surgeon: Martinique, Peter M, MD;  Location: [place] [facility];  Service: Cardiovascular;  Laterality: N/A;  . LEFT HEART CATH AND CORONARY ANGIOGRAPHY N/A 11/15/2016   Procedure: Left Heart Cath and Coronary Angiography;  Surgeon: [name], MD;  Location: [place] [facility];  Service: Cardiovascular;  Laterality: N/A;  . LEFT HEART CATH AND CORONARY ANGIOGRAPHY N/A 08/18/2020   Procedure: LEFT HEART CATH AND CORONARY ANGIOGRAPHY;  Surgeon: [name], MD;  Location: [place] [facility];  Service: Cardiovascular;  Laterality: N/A;  . PROSTATE ABLATION    . ROBOT ASSISTED LAPAROSCOPIC RADICAL PROSTATECTOMY  06/13/2007  . TEE WITHOUT CARDIOVERSION N/A 10/12/2020   Procedure: TRANSESOPHAGEAL ECHOCARDIOGRAM (TEE);  Surgeon: Wonda Olds, MD;  Location: [place];  Service: Open Heart Surgery;  Laterality: N/A;   Family History  Problem Relation Age of Onset  . Cancer Father        liver  . Cancer Brother        pancreatic    Social History  Tobacco Use  . Smoking status: Former Smoker    Years: 5.00    Quit date: 02/13/1974    Years since quitting: 46.7  . Smokeless tobacco: Never Used  Substance Use Topics  . Alcohol use: No  Marital Status: Married   ROS  Review of Systems  Constitutional: Negative for weight gain.  Cardiovascular: Positive for dyspnea on exertion (chronic,  stable). Negative for chest pain, claudication, leg swelling, near-syncope, orthopnea, palpitations, paroxysmal nocturnal dyspnea and syncope.  Respiratory: Negative for shortness of breath.   Hematologic/Lymphatic: Does not bruise/bleed easily.  Gastrointestinal: Negative for melena.  Neurological: Negative for dizziness and weakness.   Objective  Blood pressure 134/67, pulse 63, temperature (!) 97.2 F (36.2 C), height '5\' 6"'  (1.676 m), weight 216 lb (98 kg), SpO2 99 %.  Vitals with BMI 11/03/2020 10/29/2020 10/20/2020  Height '5\' 6"'  '5\' 6"'  -  Weight 216 lbs 226 lbs -  BMI 03.00 92.33 -  Systolic 007 622 633  Diastolic 67 71 70  Pulse 63 71 73     Physical Exam Vitals reviewed.  HENT:     Head: Normocephalic and atraumatic.  Cardiovascular:     Rate and Rhythm: Normal rate and regular rhythm.     Pulses:          Carotid pulses are 2+ on the right side and 2+ on the left side with bruit.      Radial pulses are 2+ on the right side and 2+ on the left side.       Dorsalis pedis pulses are 1+ on the right side and 1+ on the left side.       Posterior tibial pulses are 1+ on the right side and 1+ on the left side.     Heart sounds: Normal heart sounds, S1 normal and S2 normal. No murmur heard. No gallop.      Comments:  No JVD. Pulmonary:     Effort: Pulmonary effort is normal. No respiratory distress.     Breath sounds: Normal breath sounds. No wheezing, rhonchi or rales.  Musculoskeletal:     Right lower leg: Edema (trace) present.     Left lower leg: Edema (trace) present.  Skin:    Comments: Sternotomy incision site and right calf graft sites healing well. No evidence of erythema, drainage, bleeding. Mild ecchymosis of right posterior calf.   Neurological:     Mental Status: He is alert.    Laboratory examination:   Recent Labs    08/10/20 0811 08/18/20 0659 10/14/20 0429 10/15/20 0500 10/20/20 1400  NA 133*   < > 133* 135 133*  K 5.6*   < > 4.1 3.7 5.1  CL 98   < >  103 102 101  CO2 23   < > '25 28 25  ' GLUCOSE 191*   < > 83 140* 269*  BUN 21   < > '15 15 14  ' CREATININE 1.39*   < > 1.16 1.17 1.20  CALCIUM 10.0   < > 8.4* 8.1* 8.8*  GFRNONAA 47*   < > >60 >60 >60  GFRAA 54*  --   --   --   --    < > = values in this interval not displayed.   estimated creatinine clearance is 52 mL/min (by C-G formula based on SCr of 1.2 mg/dL).  CMP Latest Ref Rng & Units 10/20/2020 10/15/2020 10/14/2020  Glucose 70 - 99 mg/dL 269(H) 140(H) 83  BUN 8 - 23 mg/dL 14  15 15  Creatinine 0.61 - 1.24 mg/dL 1.20 1.17 1.16  Sodium 135 - 145 mmol/L 133(L) 135 133(L)  Potassium 3.5 - 5.1 mmol/L 5.1 3.7 4.1  Chloride 98 - 111 mmol/L 101 102 103  CO2 22 - 32 mmol/L '25 28 25  ' Calcium 8.9 - 10.3 mg/dL 8.8(L) 8.1(L) 8.4(L)  Total Protein 6.5 - 8.1 g/dL 5.8(L) - -  Total Bilirubin 0.3 - 1.2 mg/dL 1.0 - -  Alkaline Phos 38 - 126 U/L 48 - -  AST 15 - 41 U/L 16 - -  ALT 0 - 44 U/L 16 - -   CBC Latest Ref Rng & Units 10/20/2020 10/15/2020 10/14/2020  WBC 4.0 - 10.5 K/uL 7.7 6.3 6.3  Hemoglobin 13.0 - 17.0 g/dL 10.0(L) 8.8(L) 9.0(L)  Hematocrit 39.0 - 52.0 % 30.8(L) 26.3(L) 27.1(L)  Platelets 150 - 400 K/uL 398 154 124(L)   Lipid Panel     Component Value Date/Time   CHOL 101 10/16/2020 0232   TRIG 73 10/16/2020 0232   HDL 41 10/16/2020 0232   CHOLHDL 2.5 10/16/2020 0232   VLDL 15 10/16/2020 0232   LDLCALC 45 10/16/2020 0232   HEMOGLOBIN A1C Lab Results  Component Value Date   HGBA1C 9.0 (H) 10/08/2020   MPG 211.6 10/08/2020   TSH No results for input(s): TSH in the last 8760 hours.  External labs:  05/28/2020: HDL 53, LDL 71, total cholesterol 138, triglycerides 64 A1c 8.0% BUN 19, creatinine 1.22, EGFR 60 TSH 1.93  Labs 08/19/2019: A1c 8.7%.  TSH normal. Serum glucose 55 mg, BUN 18, creatinine 1.0, EGFR >60 mL, potassium 4.0.  CMP otherwise normal. Total cholesterol 140, triglycerides 51, HDL 55, LDL 74.  Non-HDL cholesterol 85.  03/08/2018: TSH 2.26.  Hemoglobin  13.0, CBC otherwise normal.  Hemoglobin A1c 8.5%.  Potassium 4.4, glucose 123, creatinine 1.09, EGFR 65/79, CMP otherwise normal.  Cholesterol 138, triglycerides 43, HDL 56, LDL 73.  Medications and allergies  No Known Allergies   Current Outpatient Medications  Medication Instructions  . ACCU-CHEK AVIVA PLUS test strip 1 application, Injection, 2 times daily  . amLODipine (NORVASC) 5 mg, Oral, Daily  . aspirin 325 mg, Oral, Daily  . Lantus SoloStar 16 Units, Subcutaneous, Daily  . metFORMIN (GLUCOPHAGE-XR) 1,000 mg, Oral, 2 times daily  . metoprolol tartrate (LOPRESSOR) 12.5 mg, Oral, 2 times daily  . pravastatin (PRAVACHOL) 80 mg, Oral, Every evening  . telmisartan (MICARDIS) 80 mg, Oral, Daily  . traMADol (ULTRAM) 50 mg, Oral, Every 6 hours PRN   Radiology:   No results found.  Cardiac Studies:   Coronary Artery Bypass Grafting x 4 10/12/2020: Left Internal Mammary Artery to Distal Left Anterior Descending Coronary Artery;  Saphenous Vein Graft to  Posterior Descending Coronary Artery;  Saphenous Vein Graft to 1st Obtuse Marginal Branch of Left Circumflex Coronary Artery;  Sapheonous Vein Graft to 2nd Obtuse Marginal Branch of Left Circumflex Coronary Artery;  Endoscopic Vein Harvest from right thigh and Lower Leg  Coronary Angiography 09/30/2020:   Ost LM to Mid LM lesion is 40% stenosed.  Ost LAD to Prox LAD lesion is 75% stenosed.  Ost Cx to Prox Cx lesion is 100% stenosed. 1. Severe multivessel CAD with CTO of the ostial LCx and significant proximal LAD disease by iFR of 0.71 Plan: consider CABG  Left Heart Catheterization 08/18/20: LV: Normal LV systolic function. Normal EDP. No pressure gradient across the aortic valve. Left main: Very short. Mildly calcified. LAD: Large caliber vessel. Has moderate diffuse coronary  calcification. Proximal segment has a 30 to 35% stenosis and mid to distal segment is diffusely diseased and focal 30% stenosis in the distal segment. No  significant change from prior angiography 2018. Circumflex: Very large caliber vessel and a large vessel. Heavily calcified and flush occluded at the ostium. Faint TIMI I flow is evident distally. Collaterals are evident from the RCA to the circumflex. RCA: Dominant. Mild to moderate disease in the proximal segment with calcification. Tortuous. Distal right has calcific 60% stenosis, again not significantly changed from prior angiography in 2018. Collaterals are noted to the circumflex. Attempted angioplasty and recommendations: In spite of aggressive attempted angioplasty, failed attempt. I will refer the patient to Dr. Peter Martinique to see whether she would be a candidate for attempted revascularization of the CTO, he has gracefully reviewed the angiograms and is willing to see the patient in the outpatient basis first. 85 mL contrast utilized. He will be discharged home with outpatient follow-up.  PCV ECHOCARDIOGRAM COMPLETE 29/51/8841 Normal LV systolic function with visual EF 55-60%. Left ventricle cavity is normal in size. Mild left ventricular hypertrophy. Normal global wall motion. Indeterminate diastolic filling pattern, normal LAP. Mild (Grade I) mitral regurgitation. Mild tricuspid regurgitation. Compared to prior study dated 09/13/2017 no significant change.  PCV MYOCARDIAL PERFUSION WO LEXISCAN 06/29/2020 Exercise nuclear stress test was performed using Bruce protocol. Patient reached 7.2 METS, and 86% of age predicted maximum heart rate. Exercise capacity was low. No chest pain reported. Heart rate and hemodynamic response were normal. Peak stress EKG showed sinus tachycardia, RBBB, nonspecific T wave changes inferior leads. SPECT images showed large sized, severe intensity, mid to basal, predominantly reversible perfusion defect in anterolateral, inferolateral myocardium. Intermediate risk study.  Carotid artery duplex 10/25/2019:  Diffuse homogeneous plaque in bilateral common  carotid arteries with <50%  stenosis.  Bilateral external carotid stenosis <50%.  Antegrade bilateral vertebral flow.  Compared to 03/21/2018, left ICA stenosis of 50-69% not present.  Follow up studies when clinically indicated.  See enclosed images.  Echocardiogram 09/13/2017: Left ventricle cavity is normal in size. Mild concentric hypertrophy of the left ventricle. Normal global wall motion. Doppler evidence of grade I (impaired) diastolic dysfunction, normal LAP. Calculated EF 68%. Mild tricuspid regurgitation. Estimated pulmonary artery systolic pressure 24 mmHg.  Coronary angiogram 11/15/2016: Diffuse coronary calcification involving all the coronary arteries. Very short left main with mild calcification.  D1 ostial 60-70% stenosis.  Mid ramus intermediate 60% stenosis. Otherwise moderate scattered disease other vessels. Normal LVEF, EF estimated at 60%.  EKG   EKG 11/04/2020: Sinus rhythm at a rate of 64 bpm.  Left axis, left anterior fascicular block.  Right bundle branch block.  Bifascicular block.  EKG 06/17/2020: Sinus rhythm at a rate of 61 bpm.  Normal axis.  Right bundle branch block. No evidence of ischemia. Compared to 09/20/2019, no significant change.   EKG 09/20/2019: Normal sinus rhythm with rate of 57 bpm, normal axis, right bundle branch block.  No evidence of ischemia.   EKG 03/30/2018: Normal sinus rhythm at rate of 63 bpm, left axis deviation, left anterior fascicular block.  Right bundle branch block.  No evidence of ischemia.   Assessment     ICD-10-CM   1. Coronary artery disease of native artery of native heart with stable angina pectoris ([place])  I25.118 EKG 12-Lead  2. Primary hypertension  I10   3. Hypercholesteremia  E78.00   4. Type 2 diabetes mellitus without complication, without long-term current use of insulin ([facility])  E11.9  5. Hx of CABG  Z95.1      No orders of the defined types were placed in this encounter.   Medications Discontinued During  This Encounter  Medication Reason  . colchicine 0.6 MG tablet Patient has not taken in last 30 days    Recommendations:   DEVAUGHN SAVANT  is a  85 y.o. African-American male with history of hypertension, hyperlipidemia, diabetes mellitus, remote history of prostate cancer, obesity.Coronary angiography on 11/15/2016 had revealed no high-grade stenosis but moderate disease in multiple vessels.  Repeat coronary angiography on 08/18/2020 by Dr. Einar Gip revealed occlusion of the ostial LCx, attempted PCI was unsuccessful.  Patient therefore underwent repeat coronary angiography by Dr. Martinique 09/30/2020 revealing severe multivessel CAD, therefore PCI was not attempted.  Patient was then referred to cardiothoracic surgery for evaluation of CABG. Patient underwent elective CABG x4 By Dr. Orvan Seen on 10/12/2020.  Patient presents for follow-up after CABG on 10/12/2020.  Patient is recuperating well following CABG x4.  He is tolerating guideline directed medical therapy without issue including aspirin, metoprolol, pravastatin, telmisartan.  Notably patient's last A1c was above goal at 9.0%, have advised him to follow-up with PCP closely regarding diabetes management.  Patient has had no recurrence of angina pectoris and is without clinical signs of heart failure.  Patient is stable to start cardiac rehab, have advised him to follow-up with [facility] cardiac rehab to begin.   Patient does have episodes of mildly elevated blood pressure, however review of previous orthostatic hypotension will not make changes to his antihypertensive medications at this time.  Patient will continue to monitor blood pressure and notify our office if it is consistently >130/80 mmHg.  Follow-up in 6 weeks, sooner if needed, for CAD and hypertension.   Alethia Berthold, PA-C 11/04/2020, 9:29 AM Office: [phone]

## 2020-11-04 NOTE — Progress Notes (Signed)
      [address]       [place] [address]             [phone]     CARDIOTHORACIC SURGERY OFFICE NOTE  Referring Provider is [name], MD Primary Cardiologist is No primary care provider on file. PCP is Seward Carol, MD   HPI:  85 year old man presents for initial outpatient visit status post CABG x4 on 10/12/2020.  He did well after surgery and was discharged just a few days later.  Since home he has been increasing his activity level steadily.  He has a good appetite.  He denies chest pain or shortness of breath.  Current Outpatient Medications  Medication Sig Dispense Refill  . ACCU-CHEK AVIVA PLUS test strip Inject 1 application as directed 2 (two) times daily.    Marland Kitchen amLODipine (NORVASC) 5 MG tablet Take 5 mg by mouth daily.    Marland Kitchen aspirin EC 325 MG EC tablet Take 1 tablet (325 mg total) by mouth daily.    Marland Kitchen LANTUS SOLOSTAR 100 UNIT/ML Solostar Pen Inject 16 Units into the skin daily.    . metFORMIN (GLUCOPHAGE-XR) 500 MG 24 hr tablet Take 2 tablets (1,000 mg total) by mouth 2 (two) times daily.    . metoprolol tartrate (LOPRESSOR) 25 MG tablet Take 0.5 tablets (12.5 mg total) by mouth 2 (two) times daily. 30 tablet 1  . pravastatin (PRAVACHOL) 80 MG tablet Take 1 tablet (80 mg total) by mouth every evening. 90 tablet 3  . telmisartan (MICARDIS) 80 MG tablet Take 80 mg by mouth daily.    . traMADol (ULTRAM) 50 MG tablet Take 1 tablet (50 mg total) by mouth every 6 (six) hours as needed for moderate pain. 28 tablet 0   No current facility-administered medications for this visit.      Physical Exam:   BP 102/71   Pulse 71   Resp 20   Ht 5\' 6"  (1.676 m)   Wt 102.5 kg   SpO2 91% Comment: RA  BMI 36.48 kg/m   General:  Well-appearing no acute distress  Chest:   Clear to auscultation bilaterally  CV:   Regular rate and rhythm  Incisions:  Healing well  Abdomen:  Soft nontender  Extremities:  Mild edema in the right lower extremity corresponding to  vein harvest  Diagnostic Tests:  I personally reviewed his available imaging studies including PA and lateral chest x-ray from 10/29/2020 which demonstrates clear lung fields and stable mediastinal structures   Impression:  Doing well after CABG  Plan:  Follow-up with thoracic surgery as needed Okay to drive in 2 weeks  I spent in excess of 20 minutes during the conduct of this office consultation and >50% of this time involved direct face-to-face encounter with the patient for counseling and/or coordination of their care.  Level 2                 10 minutes Level 3                 15 minutes Level 4                 25 minutes Level 5                 40 minutes  B.  Murvin Natal, MD 11/04/2020 10:18 AM

## 2020-11-11 ENCOUNTER — Other Ambulatory Visit: Payer: Self-pay | Admitting: Surgical

## 2020-11-24 DIAGNOSIS — E1169 Type 2 diabetes mellitus with other specified complication: Secondary | ICD-10-CM | POA: Diagnosis not present

## 2020-11-24 DIAGNOSIS — I10 Essential (primary) hypertension: Secondary | ICD-10-CM | POA: Diagnosis not present

## 2020-11-24 DIAGNOSIS — E119 Type 2 diabetes mellitus without complications: Secondary | ICD-10-CM | POA: Diagnosis not present

## 2020-11-24 DIAGNOSIS — I25118 Atherosclerotic heart disease of native coronary artery with other forms of angina pectoris: Secondary | ICD-10-CM | POA: Diagnosis not present

## 2020-11-24 DIAGNOSIS — E7800 Pure hypercholesterolemia, unspecified: Secondary | ICD-10-CM | POA: Diagnosis not present

## 2020-11-24 DIAGNOSIS — I25110 Atherosclerotic heart disease of native coronary artery with unstable angina pectoris: Secondary | ICD-10-CM | POA: Diagnosis not present

## 2020-12-09 ENCOUNTER — Encounter (HOSPITAL_COMMUNITY): Payer: Self-pay

## 2020-12-14 ENCOUNTER — Other Ambulatory Visit: Payer: Self-pay | Admitting: Surgical

## 2020-12-14 NOTE — Progress Notes (Signed)
Primary Physician/Referring:  Seward Carol, MD  [name], male    DOB: 05-29-1936, 85 y.o.   MRN: [id number]  Chief Complaint  Patient presents with  . Coronary Artery Disease  . Hypertension  . Follow-up    6 week   HPI:    Mark Elliott  is a 85 y.o. African-American male with history of hypertension, hyperlipidemia, diabetes mellitus, remote history of prostate cancer, obesity.Coronary angiography on 11/15/2016 had revealed no high-grade stenosis but moderate disease in multiple vessels.  Repeat coronary angiography on 08/18/2020 by Dr. Einar Gip revealed occlusion of the ostial LCx, attempted PCI was unsuccessful.  Patient therefore underwent repeat coronary angiography by Dr. Martinique 09/30/2020 revealing severe multivessel CAD, therefore PCI was not attempted.  Patient was then referred to cardiothoracic surgery for evaluation of CABG. Patient underwent elective CABG x4 By Dr. Orvan Seen on 10/12/2020.  Patient presents for 83-week follow-up of CAD and hypertension.  Patient is feeling well without chest pain, dyspnea, palpitations, dizziness, syncope, near-syncope. 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 Patient reports his sternotomy incision is no longer tender. He has noticed mild welling of his right lower leg since harvesting of vein graft from this leg. He also reports intermittent occasional cough at night when laying flat. He is scheduled to see his PCP in 3 days and will have labs done then.   Past Medical History:  Diagnosis Date  . Cancer [facility])    Prostate  . Coronary artery disease   . Diabetes mellitus without complication ([place])   . Dyspnea    with exterion  . Hyperlipidemia   . Hypertension    Past Surgical History:  Procedure Laterality Date  . ANTERIOR FUSION CERVICAL SPINE    . CORONARY ANGIOGRAPHY N/A 09/30/2020   Procedure: CORONARY ANGIOGRAPHY;  Surgeon:  Martinique, Peter M, MD;  Location: [facility];  Service: Cardiovascular;  Laterality: N/A;  . CORONARY ARTERY BYPASS GRAFT N/A 10/12/2020   Procedure: CORONARY ARTERY BYPASS GRAFTING (CABG) TIMES FOUR USING LEFT INTERNAL MAMMARY ARTERY AND RIGHT GREATER SAPHENOUS VEIN HARVESTED ENDOSCOPICALLY;  Surgeon: Wonda Olds, MD;  Location: [facility];  Service: Open Heart Surgery;  Laterality: N/A;  . HERNIA REPAIR    . INTRAVASCULAR PRESSURE WIRE/FFR STUDY N/A 09/30/2020   Procedure: INTRAVASCULAR PRESSURE WIRE/FFR STUDY;  Surgeon: Martinique, Peter M, MD;  Location: [place] [facility];  Service: Cardiovascular;  Laterality: N/A;  . LEFT HEART CATH AND CORONARY ANGIOGRAPHY N/A 11/15/2016   Procedure: Left Heart Cath and Coronary Angiography;  Surgeon: [name], MD;  Location: [facility];  Service: Cardiovascular;  Laterality: N/A;  . LEFT HEART CATH AND CORONARY ANGIOGRAPHY N/A 08/18/2020   Procedure: LEFT HEART CATH AND CORONARY ANGIOGRAPHY;  Surgeon: [name], MD;  Location: [facility];  Service: Cardiovascular;  Laterality: N/A;  . PROSTATE ABLATION    . ROBOT ASSISTED LAPAROSCOPIC RADICAL PROSTATECTOMY  06/13/2007  . TEE WITHOUT CARDIOVERSION N/A 10/12/2020   Procedure: TRANSESOPHAGEAL ECHOCARDIOGRAM (TEE);  Surgeon: Wonda Olds, MD;  Location: [facility];  Service: Open Heart Surgery;  Laterality: N/A;   Family History  Problem Relation Age of Onset  . Cancer Father        liver  . Cancer Brother        pancreatic    Social History   Tobacco Use  . Smoking status: Former Smoker  Years: 5.00    Quit date: 02/13/1974    Years since quitting: 46.8  . Smokeless tobacco: Never Used  Substance Use Topics  . Alcohol use: No  Marital Status: Married   ROS  Review of Systems  Constitutional: Negative for malaise/fatigue and weight gain.  Cardiovascular: Positive for dyspnea on exertion (improved since last visit ). Negative for chest pain, claudication, leg swelling,  near-syncope, orthopnea, palpitations, paroxysmal nocturnal dyspnea and syncope.  Respiratory: Negative for shortness of breath.   Hematologic/Lymphatic: Does not bruise/bleed easily.  Gastrointestinal: Negative for melena.  Neurological: Negative for dizziness and weakness.   Objective  Blood pressure (!) 129/56, pulse (!) 52, temperature (!) 97.2 F (36.2 C), resp. rate 16, height '5\' 6"'  (1.676 m), weight 221 lb (100.2 kg), SpO2 96 %.  Vitals with BMI 12/15/2020 11/03/2020 10/29/2020  Height '5\' 6"'  '5\' 6"'  '5\' 6"'   Weight 221 lbs 216 lbs 226 lbs  BMI 35.69 40.10 27.25  Systolic 366 440 347  Diastolic 56 67 71  Pulse 52 63 71     Physical Exam Vitals reviewed.  Cardiovascular:     Rate and Rhythm: Normal rate and regular rhythm.     Pulses:          Carotid pulses are 2+ on the right side and 2+ on the left side with bruit.      Radial pulses are 2+ on the right side and 2+ on the left side.       Dorsalis pedis pulses are 1+ on the right side and 1+ on the left side.       Posterior tibial pulses are 1+ on the right side and 1+ on the left side.     Heart sounds: Normal heart sounds, S1 normal and S2 normal. No murmur heard. No gallop.      Comments:  No JVD. Pulmonary:     Effort: Pulmonary effort is normal. No respiratory distress.     Breath sounds: Normal breath sounds. No wheezing, rhonchi or rales.  Musculoskeletal:     Right lower leg: Edema (trace) present.     Left lower leg: Edema (trace) present.  Skin:    Comments: Sternotomy incision site and right calf graft sites well healed. No evidence of erythema, drainage, bleeding.  Neurological:     Mental Status: He is alert.    Laboratory examination:   Recent Labs    08/10/20 0811 08/18/20 0659 10/14/20 0429 10/15/20 0500 10/20/20 1400  NA 133*   < > 133* 135 133*  K 5.6*   < > 4.1 3.7 5.1  CL 98   < > 103 102 101  CO2 23   < > '25 28 25  ' GLUCOSE 191*   < > 83 140* 269*  BUN 21   < > '15 15 14  ' CREATININE 1.39*    < > 1.16 1.17 1.20  CALCIUM 10.0   < > 8.4* 8.1* 8.8*  GFRNONAA 47*   < > >60 >60 >60  GFRAA 54*  --   --   --   --    < > = values in this interval not displayed.   CrCl cannot be calculated (Patient's most recent lab result is older than the maximum 21 days allowed.).  CMP Latest Ref Rng & Units 10/20/2020 10/15/2020 10/14/2020  Glucose 70 - 99 mg/dL 269(H) 140(H) 83  BUN 8 - 23 mg/dL '14 15 15  ' Creatinine 0.61 - 1.24 mg/dL 1.20 1.17 1.16  Sodium 135 -  145 mmol/L 133(L) 135 133(L)  Potassium 3.5 - 5.1 mmol/L 5.1 3.7 4.1  Chloride 98 - 111 mmol/L 101 102 103  CO2 22 - 32 mmol/L '25 28 25  ' Calcium 8.9 - 10.3 mg/dL 8.8(L) 8.1(L) 8.4(L)  Total Protein 6.5 - 8.1 g/dL 5.8(L) - -  Total Bilirubin 0.3 - 1.2 mg/dL 1.0 - -  Alkaline Phos 38 - 126 U/L 48 - -  AST 15 - 41 U/L 16 - -  ALT 0 - 44 U/L 16 - -   CBC Latest Ref Rng & Units 10/20/2020 10/15/2020 10/14/2020  WBC 4.0 - 10.5 K/uL 7.7 6.3 6.3  Hemoglobin 13.0 - 17.0 g/dL 10.0(L) 8.8(L) 9.0(L)  Hematocrit 39.0 - 52.0 % 30.8(L) 26.3(L) 27.1(L)  Platelets 150 - 400 K/uL 398 154 124(L)   Lipid Panel     Component Value Date/Time   CHOL 101 10/16/2020 0232   TRIG 73 10/16/2020 0232   HDL 41 10/16/2020 0232   CHOLHDL 2.5 10/16/2020 0232   VLDL 15 10/16/2020 0232   LDLCALC 45 10/16/2020 0232   HEMOGLOBIN A1C Lab Results  Component Value Date   HGBA1C 9.0 (H) 10/08/2020   MPG 211.6 10/08/2020   TSH No results for input(s): TSH in the last 8760 hours.  External labs:  05/28/2020: HDL 53, LDL 71, total cholesterol 138, triglycerides 64 A1c 8.0% BUN 19, creatinine 1.22, EGFR 60 TSH 1.93  08/19/2019: A1c 8.7%.  TSH normal. Serum glucose 55 mg, BUN 18, creatinine 1.0, EGFR >60 mL, potassium 4.0.  CMP otherwise normal. Total cholesterol 140, triglycerides 51, HDL 55, LDL 74.  Non-HDL cholesterol 85.  03/08/2018: TSH 2.26.  Hemoglobin 13.0, CBC otherwise normal.  Hemoglobin A1c 8.5%.  Potassium 4.4, glucose 123, creatinine 1.09, EGFR  65/79, CMP otherwise normal.  Cholesterol 138, triglycerides 43, HDL 56, LDL 73.  Allergies  No Known Allergies    Medications Prior to Visit:   Outpatient Medications Prior to Visit  Medication Sig Dispense Refill  . ACCU-CHEK AVIVA PLUS test strip Inject 1 application as directed 2 (two) times daily.    Marland Kitchen amLODipine (NORVASC) 5 MG tablet Take 5 mg by mouth daily.    Marland Kitchen aspirin EC 325 MG EC tablet Take 1 tablet (325 mg total) by mouth daily.    Marland Kitchen LANTUS SOLOSTAR 100 UNIT/ML Solostar Pen Inject 16 Units into the skin daily.    . metFORMIN (GLUCOPHAGE-XR) 500 MG 24 hr tablet Take 2 tablets (1,000 mg total) by mouth 2 (two) times daily.    . metoprolol tartrate (LOPRESSOR) 25 MG tablet Take 0.5 tablets (12.5 mg total) by mouth 2 (two) times daily. 30 tablet 1  . pravastatin (PRAVACHOL) 80 MG tablet Take 1 tablet (80 mg total) by mouth every evening. 90 tablet 3  . telmisartan (MICARDIS) 80 MG tablet Take 80 mg by mouth daily.    . traMADol (ULTRAM) 50 MG tablet Take 1 tablet (50 mg total) by mouth every 6 (six) hours as needed for moderate pain. 28 tablet 0   No facility-administered medications prior to visit.     Final Medications at End of Visit    Current Meds  Medication Sig  . ACCU-CHEK AVIVA PLUS test strip Inject 1 application as directed 2 (two) times daily.  Marland Kitchen amLODipine (NORVASC) 5 MG tablet Take 5 mg by mouth daily.  Marland Kitchen aspirin EC 325 MG EC tablet Take 1 tablet (325 mg total) by mouth daily.  . furosemide (LASIX) 20 MG tablet Take 1 tablet (20 mg  total) by mouth daily as needed for fluid or edema.  Marland Kitchen LANTUS SOLOSTAR 100 UNIT/ML Solostar Pen Inject 16 Units into the skin daily.  . metFORMIN (GLUCOPHAGE-XR) 500 MG 24 hr tablet Take 2 tablets (1,000 mg total) by mouth 2 (two) times daily.  . metoprolol tartrate (LOPRESSOR) 25 MG tablet Take 0.5 tablets (12.5 mg total) by mouth 2 (two) times daily.  . pravastatin (PRAVACHOL) 80 MG tablet Take 1 tablet (80 mg total) by mouth  every evening.  Marland Kitchen telmisartan (MICARDIS) 80 MG tablet Take 80 mg by mouth daily.  . traMADol (ULTRAM) 50 MG tablet Take 1 tablet (50 mg total) by mouth every 6 (six) hours as needed for moderate pain.    Radiology:   No results found.  Cardiac Studies:   Coronary Artery Bypass Grafting x 4 10/12/2020: Left Internal Mammary Artery to Distal Left Anterior Descending Coronary Artery;  Saphenous Vein Graft to  Posterior Descending Coronary Artery;  Saphenous Vein Graft to 1st Obtuse Marginal Branch of Left Circumflex Coronary Artery;  Sapheonous Vein Graft to 2nd Obtuse Marginal Branch of Left Circumflex Coronary Artery;  Endoscopic Vein Harvest from right thigh and Lower Leg  Coronary Angiography 09/30/2020:   Ost LM to Mid LM lesion is 40% stenosed.  Ost LAD to Prox LAD lesion is 75% stenosed.  Ost Cx to Prox Cx lesion is 100% stenosed. 1. Severe multivessel CAD with CTO of the ostial LCx and significant proximal LAD disease by iFR of 0.71 Plan: consider CABG  Left Heart Catheterization 08/18/20: LV: Normal LV systolic function. Normal EDP. No pressure gradient across the aortic valve. Left main: Very short. Mildly calcified. LAD: Large caliber vessel. Has moderate diffuse coronary calcification. Proximal segment has a 30 to 35% stenosis and mid to distal segment is diffusely diseased and focal 30% stenosis in the distal segment. No significant change from prior angiography 2018. Circumflex: Very large caliber vessel and a large vessel. Heavily calcified and flush occluded at the ostium. Faint TIMI I flow is evident distally. Collaterals are evident from the RCA to the circumflex. RCA: Dominant. Mild to moderate disease in the proximal segment with calcification. Tortuous. Distal right has calcific 60% stenosis, again not significantly changed from prior angiography in 2018. Collaterals are noted to the circumflex. Attempted angioplasty and recommendations: In spite of aggressive  attempted angioplasty, failed attempt. I will refer the patient to Dr. Peter Martinique to see whether she would be a candidate for attempted revascularization of the CTO, he has gracefully reviewed the angiograms and is willing to see the patient in the outpatient basis first. 85 mL contrast utilized. He will be discharged home with outpatient follow-up.  PCV ECHOCARDIOGRAM COMPLETE 07/19/3233 Normal LV systolic function with visual EF 55-60%. Left ventricle cavity is normal in size. Mild left ventricular hypertrophy. Normal global wall motion. Indeterminate diastolic filling pattern, normal LAP. Mild (Grade I) mitral regurgitation. Mild tricuspid regurgitation. Compared to prior study dated 09/13/2017 no significant change.  PCV MYOCARDIAL PERFUSION WO LEXISCAN 06/29/2020 Exercise nuclear stress test was performed using Bruce protocol. Patient reached 7.2 METS, and 86% of age predicted maximum heart rate. Exercise capacity was low. No chest pain reported. Heart rate and hemodynamic response were normal. Peak stress EKG showed sinus tachycardia, RBBB, nonspecific T wave changes inferior leads. SPECT images showed large sized, severe intensity, mid to basal, predominantly reversible perfusion defect in anterolateral, inferolateral myocardium. Intermediate risk study.  Carotid artery duplex 10/25/2019:  Diffuse homogeneous plaque in bilateral common carotid arteries with <50%  stenosis.  Bilateral external carotid stenosis <50%.  Antegrade bilateral vertebral flow.  Compared to 03/21/2018, left ICA stenosis of 50-69% not present.  Follow up studies when clinically indicated.  See enclosed images.  Echocardiogram 09/13/2017: Left ventricle cavity is normal in size. Mild concentric hypertrophy of the left ventricle. Normal global wall motion. Doppler evidence of grade I (impaired) diastolic dysfunction, normal LAP. Calculated EF 68%. Mild tricuspid regurgitation. Estimated pulmonary artery  systolic pressure 24 mmHg.  Coronary angiogram 11/15/2016: Diffuse coronary calcification involving all the coronary arteries. Very short left main with mild calcification.  D1 ostial 60-70% stenosis.  Mid ramus intermediate 60% stenosis. Otherwise moderate scattered disease other vessels. Normal LVEF, EF estimated at 60%.  EKG   EKG 11/04/2020: Sinus rhythm at a rate of 64 bpm.  Left axis, left anterior fascicular block.  Right bundle branch block.  Bifascicular block.  EKG 06/17/2020: Sinus rhythm at a rate of 61 bpm.  Normal axis.  Right bundle branch block. No evidence of ischemia. Compared to 09/20/2019, no significant change.   EKG 09/20/2019: Normal sinus rhythm with rate of 57 bpm, normal axis, right bundle branch block.  No evidence of ischemia.   EKG 03/30/2018: Normal sinus rhythm at rate of 63 bpm, left axis deviation, left anterior fascicular block.  Right bundle branch block.  No evidence of ischemia.   Assessment     ICD-10-CM   1. Coronary artery disease of native artery of native heart with stable angina pectoris ([place])  I25.118 Brain natriuretic peptide  2. Hx of CABG  Z95.1 Brain natriuretic peptide  3. Primary hypertension  I10   4. Type 2 diabetes mellitus without complication, without long-term current use of insulin ([facility])  E11.9      Meds ordered this encounter  Medications  . furosemide (LASIX) 20 MG tablet    Sig: Take 1 tablet (20 mg total) by mouth daily as needed for fluid or edema.    Dispense:  30 tablet    Refill:  3    There are no discontinued medications.  Recommendations:   Mark Elliott  is a  85 y.o. African-American male with history of hypertension, hyperlipidemia, diabetes mellitus, remote history of prostate cancer, obesity.Coronary angiography on 11/15/2016 had revealed no high-grade stenosis but moderate disease in multiple vessels.  Repeat coronary angiography on 08/18/2020 by Dr. Einar Gip revealed occlusion of the ostial LCx, attempted PCI was  unsuccessful.  Patient therefore underwent repeat coronary angiography by Dr. Martinique 09/30/2020 revealing severe multivessel CAD, therefore PCI was not attempted.  Patient was then referred to cardiothoracic surgery for evaluation of CABG. Patient underwent elective CABG x4 By Dr. Orvan Seen on 10/12/2020.  Patient presents for 83-week follow-up of CAD and hypertension. Patient is doing well. He remains relatively asymptomatic. He does have intermittent leg swelling right worse than left. Will therefore start him on furosemide 20 mg once daily as needed for edema/fluid overload. He verbalized understanding of dosing instructions. In regard to cough at night, do not suspect significantly volume overload as patient is euvolemic on exam at this time. Will send order for BNP to PCP office to be done at appointment later this week.   Continue present medications.   Follow up in 3 months for CAD and hypertension.    Alethia Berthold, PA-C 12/15/2020, 10:29 AM Office: [phone]

## 2020-12-15 ENCOUNTER — Ambulatory Visit: Payer: Medicare Other | Admitting: Student

## 2020-12-15 ENCOUNTER — Other Ambulatory Visit: Payer: Self-pay

## 2020-12-15 ENCOUNTER — Encounter: Payer: Self-pay | Admitting: Student

## 2020-12-15 VITALS — BP 129/56 | HR 52 | Temp 97.2°F | Resp 16 | Ht 66.0 in | Wt 221.0 lb

## 2020-12-15 DIAGNOSIS — I25118 Atherosclerotic heart disease of native coronary artery with other forms of angina pectoris: Secondary | ICD-10-CM | POA: Diagnosis not present

## 2020-12-15 DIAGNOSIS — I10 Essential (primary) hypertension: Secondary | ICD-10-CM | POA: Diagnosis not present

## 2020-12-15 DIAGNOSIS — Z951 Presence of aortocoronary bypass graft: Secondary | ICD-10-CM

## 2020-12-15 DIAGNOSIS — E119 Type 2 diabetes mellitus without complications: Secondary | ICD-10-CM | POA: Diagnosis not present

## 2020-12-15 MED ORDER — FUROSEMIDE 20 MG PO TABS: 20.0000 mg | ORAL_TABLET | Freq: Every day | ORAL | 3 refills | Status: DC | PRN

## 2020-12-17 ENCOUNTER — Other Ambulatory Visit: Payer: Self-pay | Admitting: Surgical

## 2020-12-17 DIAGNOSIS — E7800 Pure hypercholesterolemia, unspecified: Secondary | ICD-10-CM | POA: Diagnosis not present

## 2020-12-17 DIAGNOSIS — Z794 Long term (current) use of insulin: Secondary | ICD-10-CM | POA: Diagnosis not present

## 2020-12-17 DIAGNOSIS — Z951 Presence of aortocoronary bypass graft: Secondary | ICD-10-CM | POA: Diagnosis not present

## 2020-12-17 DIAGNOSIS — I10 Essential (primary) hypertension: Secondary | ICD-10-CM | POA: Diagnosis not present

## 2020-12-17 DIAGNOSIS — E1169 Type 2 diabetes mellitus with other specified complication: Secondary | ICD-10-CM | POA: Diagnosis not present

## 2020-12-23 ENCOUNTER — Other Ambulatory Visit: Payer: Self-pay | Admitting: Surgical

## 2020-12-24 ENCOUNTER — Telehealth (HOSPITAL_COMMUNITY): Payer: Self-pay

## 2020-12-24 NOTE — Telephone Encounter (Signed)
No response from pt.  Closed referral  

## 2020-12-26 ENCOUNTER — Other Ambulatory Visit: Payer: Self-pay | Admitting: Surgical

## 2020-12-28 ENCOUNTER — Other Ambulatory Visit: Payer: Self-pay | Admitting: Student

## 2020-12-28 MED ORDER — METOPROLOL TARTRATE 25 MG PO TABS: 12.5000 mg | ORAL_TABLET | Freq: Two times a day (BID) | ORAL | 1 refills | Status: DC

## 2020-12-29 ENCOUNTER — Other Ambulatory Visit: Payer: Self-pay | Admitting: Cardiology

## 2020-12-29 DIAGNOSIS — I25118 Atherosclerotic heart disease of native coronary artery with other forms of angina pectoris: Secondary | ICD-10-CM

## 2020-12-29 MED ORDER — METOPROLOL TARTRATE 25 MG PO TABS: 25.0000 mg | ORAL_TABLET | Freq: Two times a day (BID) | ORAL | 2 refills | Status: AC

## 2021-02-02 DIAGNOSIS — I25118 Atherosclerotic heart disease of native coronary artery with other forms of angina pectoris: Secondary | ICD-10-CM | POA: Diagnosis not present

## 2021-02-02 DIAGNOSIS — I10 Essential (primary) hypertension: Secondary | ICD-10-CM | POA: Diagnosis not present

## 2021-02-02 DIAGNOSIS — E1169 Type 2 diabetes mellitus with other specified complication: Secondary | ICD-10-CM | POA: Diagnosis not present

## 2021-02-02 DIAGNOSIS — E7800 Pure hypercholesterolemia, unspecified: Secondary | ICD-10-CM | POA: Diagnosis not present

## 2021-02-02 DIAGNOSIS — E119 Type 2 diabetes mellitus without complications: Secondary | ICD-10-CM | POA: Diagnosis not present

## 2021-02-02 DIAGNOSIS — I25110 Atherosclerotic heart disease of native coronary artery with unstable angina pectoris: Secondary | ICD-10-CM | POA: Diagnosis not present

## 2021-02-22 DIAGNOSIS — M545 Low back pain, unspecified: Secondary | ICD-10-CM | POA: Diagnosis not present

## 2021-02-27 ENCOUNTER — Other Ambulatory Visit: Payer: Self-pay | Admitting: Cardiology

## 2021-03-12 DIAGNOSIS — E1169 Type 2 diabetes mellitus with other specified complication: Secondary | ICD-10-CM | POA: Diagnosis not present

## 2021-03-12 DIAGNOSIS — I25110 Atherosclerotic heart disease of native coronary artery with unstable angina pectoris: Secondary | ICD-10-CM | POA: Diagnosis not present

## 2021-03-12 DIAGNOSIS — I25118 Atherosclerotic heart disease of native coronary artery with other forms of angina pectoris: Secondary | ICD-10-CM | POA: Diagnosis not present

## 2021-03-12 DIAGNOSIS — I10 Essential (primary) hypertension: Secondary | ICD-10-CM | POA: Diagnosis not present

## 2021-03-12 DIAGNOSIS — E7800 Pure hypercholesterolemia, unspecified: Secondary | ICD-10-CM | POA: Diagnosis not present

## 2021-03-12 DIAGNOSIS — E119 Type 2 diabetes mellitus without complications: Secondary | ICD-10-CM | POA: Diagnosis not present

## 2021-03-17 ENCOUNTER — Encounter: Payer: Self-pay | Admitting: Student

## 2021-03-17 ENCOUNTER — Ambulatory Visit: Payer: Medicare Other | Admitting: Student

## 2021-03-17 ENCOUNTER — Other Ambulatory Visit: Payer: Self-pay

## 2021-03-17 VITALS — BP 130/53 | HR 45 | Temp 97.2°F | Resp 17 | Ht 66.0 in | Wt 225.8 lb

## 2021-03-17 DIAGNOSIS — I10 Essential (primary) hypertension: Secondary | ICD-10-CM

## 2021-03-17 DIAGNOSIS — E7800 Pure hypercholesterolemia, unspecified: Secondary | ICD-10-CM

## 2021-03-17 DIAGNOSIS — Z951 Presence of aortocoronary bypass graft: Secondary | ICD-10-CM | POA: Diagnosis not present

## 2021-03-17 DIAGNOSIS — I25118 Atherosclerotic heart disease of native coronary artery with other forms of angina pectoris: Secondary | ICD-10-CM

## 2021-03-17 NOTE — Progress Notes (Addendum)
Primary Physician/Referring:  Seward Carol, MD  [name], male    DOB: Jul 17, 1935, 85 y.o.   MRN: [id number]  No chief complaint on file.  HPI:    Mark Elliott  is a 85 y.o. African-American male with history of hypertension, hyperlipidemia, diabetes mellitus, remote history of prostate cancer, obesity. Coronary angiography on 11/15/2016 had revealed no high-grade stenosis but moderate disease in multiple vessels.  Repeat coronary angiography on 08/18/2020 by Dr. Einar Gip revealed occlusion of the ostial LCx, attempted PCI was unsuccessful.  Patient therefore underwent repeat coronary angiography by Dr. Martinique 09/30/2020 revealing severe multivessel CAD, therefore PCI was not attempted.  Patient was then referred to cardiothoracic surgery for evaluation of CABG.  Patient underwent elective CABG x4 By Dr. Orvan Seen on 10/12/2020.  Patient presents for 3 month follow up of CAD and hypertension. At last visit office visit added furosemide 20 mg once daily as needed for edema/fluid overload.  Also ordered BNP to be done in PCPs office, however is not available for review.  Patient is doing well overall without specific cardiovascular complaints.  He has had no recurrence of cough since last office visit.  Patient has returned to working part-time without issue.  He continues to make efforts to make diet and lifestyle modifications and reports improvement of diabetes control.  Denies without chest pain, dyspnea, palpitations, dizziness, syncope, near-syncope.   Past Medical History:  Diagnosis Date   Cancer [facility])    Prostate   Coronary artery disease    Diabetes mellitus without complication ([place])    Dyspnea    with exterion   Hyperlipidemia    Hypertension    Past Surgical History:  Procedure Laterality Date   ANTERIOR FUSION CERVICAL SPINE     CORONARY ANGIOGRAPHY N/A 09/30/2020   Procedure: CORONARY ANGIOGRAPHY;  Surgeon: Martinique, Peter M, MD;  Location: [place] CV LAB;   Service: Cardiovascular;  Laterality: N/A;   CORONARY ARTERY BYPASS GRAFT N/A 10/12/2020   Procedure: CORONARY ARTERY BYPASS GRAFTING (CABG) TIMES FOUR USING LEFT INTERNAL MAMMARY ARTERY AND RIGHT GREATER SAPHENOUS VEIN HARVESTED ENDOSCOPICALLY;  Surgeon: Wonda Olds, MD;  Location: [place];  Service: Open Heart Surgery;  Laterality: N/A;   HERNIA REPAIR     INTRAVASCULAR PRESSURE WIRE/FFR STUDY N/A 09/30/2020   Procedure: INTRAVASCULAR PRESSURE WIRE/FFR STUDY;  Surgeon: Martinique, Peter M, MD;  Location: [place] [facility];  Service: Cardiovascular;  Laterality: N/A;   LEFT HEART CATH AND CORONARY ANGIOGRAPHY N/A 11/15/2016   Procedure: Left Heart Cath and Coronary Angiography;  Surgeon: [name], MD;  Location: [place] CV LAB;  Service: Cardiovascular;  Laterality: N/A;   LEFT HEART CATH AND CORONARY ANGIOGRAPHY N/A 08/18/2020   Procedure: LEFT HEART CATH AND CORONARY ANGIOGRAPHY;  Surgeon: [name], MD;  Location: [place] CV LAB;  Service: Cardiovascular;  Laterality: N/A;   PROSTATE ABLATION     ROBOT ASSISTED LAPAROSCOPIC RADICAL PROSTATECTOMY  06/13/2007   TEE WITHOUT CARDIOVERSION N/A 10/12/2020   Procedure: TRANSESOPHAGEAL ECHOCARDIOGRAM (TEE);  Surgeon: Wonda Olds, MD;  Location: [place];  Service: Open Heart Surgery;  Laterality: N/A;   Family History  Problem Relation Age of Onset   Cancer Father        liver   Cancer Brother        pancreatic    Social History   Tobacco Use   Smoking status: Former    Years: 5.00    Types: Cigarettes    Quit date:  02/13/1974    Years since quitting: 47.1   Smokeless tobacco: Never  Substance Use Topics   Alcohol use: No  Marital Status: Married   ROS  Review of Systems  Cardiovascular:  Positive for dyspnea on exertion (improved since last visit ). Negative for chest pain, claudication, leg swelling, near-syncope, orthopnea, palpitations, paroxysmal nocturnal dyspnea and syncope.  Respiratory:  Negative for shortness of  breath.   Neurological:  Negative for dizziness.  Objective  Blood pressure (!) 130/53, pulse (!) 45, temperature (!) 97.2 F (36.2 C), temperature source Temporal, resp. rate 17, height '5\' 6"'  (1.676 m), weight 225 lb 12.8 oz (102.4 kg), SpO2 99 %.  Vitals with BMI 03/17/2021 03/17/2021 12/15/2020  Height - '5\' 6"'  '5\' 6"'   Weight - 225 lbs 13 oz 221 lbs  BMI - 71.16 57.90  Systolic 383 338 329  Diastolic 53 58 56  Pulse 45 55 52     Physical Exam Vitals reviewed.  Cardiovascular:     Rate and Rhythm: Normal rate and regular rhythm.     Pulses:          Carotid pulses are 2+ on the right side and 2+ on the left side with bruit.      Radial pulses are 2+ on the right side and 2+ on the left side.       Dorsalis pedis pulses are 1+ on the right side and 1+ on the left side.       Posterior tibial pulses are 1+ on the right side and 1+ on the left side.     Heart sounds: Normal heart sounds, S1 normal and S2 normal. No murmur heard.   No gallop.     Comments:  No JVD. Pulmonary:     Effort: Pulmonary effort is normal. No respiratory distress.     Breath sounds: Normal breath sounds. No wheezing, rhonchi or rales.  Musculoskeletal:     Right lower leg: Edema (trace) present.     Left lower leg: Edema (trace) present.  Skin:    Comments: Sternotomy incision site and right calf graft sites well healed. No evidence of erythema, drainage, bleeding.  Neurological:     Mental Status: He is alert.  Physical exam unchanged compared to previous.  Laboratory examination:   Recent Labs    08/10/20 0811 08/18/20 0659 10/14/20 0429 10/15/20 0500 10/20/20 1400  NA 133*   < > 133* 135 133*  K 5.6*   < > 4.1 3.7 5.1  CL 98   < > 103 102 101  CO2 23   < > '25 28 25  ' GLUCOSE 191*   < > 83 140* 269*  BUN 21   < > '15 15 14  ' CREATININE 1.39*   < > 1.16 1.17 1.20  CALCIUM 10.0   < > 8.4* 8.1* 8.8*  GFRNONAA 47*   < > >60 >60 >60  GFRAA 54*  --   --   --   --    < > = values in this interval not  displayed.   CrCl cannot be calculated (Patient's most recent lab result is older than the maximum 21 days allowed.).  CMP Latest Ref Rng & Units 10/20/2020 10/15/2020 10/14/2020  Glucose 70 - 99 mg/dL 269(H) 140(H) 83  BUN 8 - 23 mg/dL '14 15 15  ' Creatinine 0.61 - 1.24 mg/dL 1.20 1.17 1.16  Sodium 135 - 145 mmol/L 133(L) 135 133(L)  Potassium 3.5 - 5.1 mmol/L 5.1 3.7 4.1  Chloride 98 - 111 mmol/L 101 102 103  CO2 22 - 32 mmol/L '25 28 25  ' Calcium 8.9 - 10.3 mg/dL 8.8(L) 8.1(L) 8.4(L)  Total Protein 6.5 - 8.1 g/dL 5.8(L) - -  Total Bilirubin 0.3 - 1.2 mg/dL 1.0 - -  Alkaline Phos 38 - 126 U/L 48 - -  AST 15 - 41 U/L 16 - -  ALT 0 - 44 U/L 16 - -   CBC Latest Ref Rng & Units 10/20/2020 10/15/2020 10/14/2020  WBC 4.0 - 10.5 K/uL 7.7 6.3 6.3  Hemoglobin 13.0 - 17.0 g/dL 10.0(L) 8.8(L) 9.0(L)  Hematocrit 39.0 - 52.0 % 30.8(L) 26.3(L) 27.1(L)  Platelets 150 - 400 K/uL 398 154 124(L)   Lipid Panel     Component Value Date/Time   CHOL 101 10/16/2020 0232   TRIG 73 10/16/2020 0232   HDL 41 10/16/2020 0232   CHOLHDL 2.5 10/16/2020 0232   VLDL 15 10/16/2020 0232   LDLCALC 45 10/16/2020 0232   HEMOGLOBIN A1C Lab Results  Component Value Date   HGBA1C 9.0 (H) 10/08/2020   MPG 211.6 10/08/2020   TSH No results for input(s): TSH in the last 8760 hours.  External labs:  05/28/2020: HDL 53, LDL 71, total cholesterol 138, triglycerides 64 A1c 8.0% BUN 19, creatinine 1.22, EGFR 60 TSH 1.93  08/19/2019: A1c 8.7%.  TSH normal. Serum glucose 55 mg, BUN 18, creatinine 1.0, EGFR >60 mL, potassium 4.0.  CMP otherwise normal. Total cholesterol 140, triglycerides 51, HDL 55, LDL 74.  Non-HDL cholesterol 85.  03/08/2018: TSH 2.26.  Hemoglobin 13.0, CBC otherwise normal.  Hemoglobin A1c 8.5%.  Potassium 4.4, glucose 123, creatinine 1.09, EGFR 65/79, CMP otherwise normal.  Cholesterol 138, triglycerides 43, HDL 56, LDL 73.  Allergies  No Known Allergies    Medications Prior to Visit:    Outpatient Medications Prior to Visit  Medication Sig Dispense Refill   ACCU-CHEK AVIVA PLUS test strip Inject 1 application as directed 2 (two) times daily.     amLODipine (NORVASC) 5 MG tablet Take 5 mg by mouth daily.     aspirin EC 325 MG EC tablet Take 1 tablet (325 mg total) by mouth daily.     furosemide (LASIX) 20 MG tablet Take 1 tablet (20 mg total) by mouth daily as needed for fluid or edema. 30 tablet 3   JARDIANCE 10 MG TABS tablet Take 10 mg by mouth daily.     LANTUS SOLOSTAR 100 UNIT/ML Solostar Pen Inject 16 Units into the skin daily.     metFORMIN (GLUCOPHAGE-XR) 500 MG 24 hr tablet Take 2 tablets (1,000 mg total) by mouth 2 (two) times daily.     metoprolol tartrate (LOPRESSOR) 25 MG tablet Take 1 tablet (25 mg total) by mouth 2 (two) times daily. 60 tablet 2   olmesartan (BENICAR) 20 MG tablet Take 10 mg by mouth daily.     pravastatin (PRAVACHOL) 80 MG tablet Take 1 tablet (80 mg total) by mouth every evening. 90 tablet 3   telmisartan (MICARDIS) 80 MG tablet Take 80 mg by mouth daily.     traMADol (ULTRAM) 50 MG tablet Take 1 tablet (50 mg total) by mouth every 6 (six) hours as needed for moderate pain. 28 tablet 0   No facility-administered medications prior to visit.     Final Medications at End of Visit    Current Meds  Medication Sig   ACCU-CHEK AVIVA PLUS test strip Inject 1 application as directed 2 (two) times daily.  amLODipine (NORVASC) 5 MG tablet Take 5 mg by mouth daily.   aspirin EC 325 MG EC tablet Take 1 tablet (325 mg total) by mouth daily.   furosemide (LASIX) 20 MG tablet Take 1 tablet (20 mg total) by mouth daily as needed for fluid or edema.   JARDIANCE 10 MG TABS tablet Take 10 mg by mouth daily.   LANTUS SOLOSTAR 100 UNIT/ML Solostar Pen Inject 16 Units into the skin daily.   metFORMIN (GLUCOPHAGE-XR) 500 MG 24 hr tablet Take 2 tablets (1,000 mg total) by mouth 2 (two) times daily.   metoprolol tartrate (LOPRESSOR) 25 MG tablet Take 1  tablet (25 mg total) by mouth 2 (two) times daily.   olmesartan (BENICAR) 20 MG tablet Take 10 mg by mouth daily.   pravastatin (PRAVACHOL) 80 MG tablet Take 1 tablet (80 mg total) by mouth every evening.    Radiology:   No results found.  Cardiac Studies:   Coronary Artery Bypass Grafting x 4 10/12/2020: Left Internal Mammary Artery to Distal Left Anterior Descending Coronary Artery;  Saphenous Vein Graft to  Posterior Descending Coronary Artery;  Saphenous Vein Graft to 1st Obtuse Marginal Branch of Left Circumflex Coronary Artery;  Sapheonous Vein Graft to 2nd Obtuse Marginal Branch of Left Circumflex Coronary Artery;  Endoscopic Vein Harvest from right thigh and Lower Leg  Coronary Angiography 09/30/2020:  Ost LM to Mid LM lesion is 40% stenosed. Ost LAD to Prox LAD lesion is 75% stenosed. Ost Cx to Prox Cx lesion is 100% stenosed. 1. Severe multivessel CAD with CTO of the ostial LCx and significant proximal LAD disease by iFR of 0.71 Plan: consider CABG  Left Heart Catheterization 08/18/20: LV: Normal LV systolic function. Normal EDP. No pressure gradient across the aortic valve. Left main: Very short. Mildly calcified. LAD: Large caliber vessel. Has moderate diffuse coronary calcification. Proximal segment has a 30 to 35% stenosis and mid to distal segment is diffusely diseased and focal 30% stenosis in the distal segment. No significant change from prior angiography 2018. Circumflex: Very large caliber vessel and a large vessel. Heavily calcified and flush occluded at the ostium. Faint TIMI I flow is evident distally. Collaterals are evident from the RCA to the circumflex. RCA: Dominant. Mild to moderate disease in the proximal segment with calcification. Tortuous. Distal right has calcific 60% stenosis, again not significantly changed from prior angiography in 2018. Collaterals are noted to the circumflex. Attempted angioplasty and recommendations: In spite of aggressive attempted  angioplasty, failed attempt. I will refer the patient to Dr. Peter Martinique to see whether she would be a candidate for attempted revascularization of the CTO, he has gracefully reviewed the angiograms and is willing to see the patient in the outpatient basis first. 85 mL contrast utilized. He will be discharged home with outpatient follow-up.  PCV ECHOCARDIOGRAM COMPLETE 75/17/0017 Normal LV systolic function with visual EF 55-60%. Left ventricle cavity is normal in size. Mild left ventricular hypertrophy. Normal global wall motion. Indeterminate diastolic filling pattern, normal LAP. Mild (Grade I) mitral regurgitation. Mild tricuspid regurgitation. Compared to prior study dated 09/13/2017 no significant change.  PCV MYOCARDIAL PERFUSION WO LEXISCAN 06/29/2020 Exercise nuclear stress test was performed using Bruce protocol. Patient reached 7.2 METS, and 86% of age predicted maximum heart rate. Exercise capacity was low. No chest pain reported. Heart rate and hemodynamic response were normal. Peak stress EKG showed sinus tachycardia, RBBB, nonspecific T wave changes inferior leads. SPECT images showed large sized, severe intensity, mid to basal, predominantly  reversible perfusion defect in anterolateral, inferolateral myocardium. Intermediate risk study.  Carotid artery duplex  10/25/2019:  Diffuse homogeneous plaque in bilateral common carotid arteries with <50%  stenosis.  Bilateral external carotid stenosis <50%.  Antegrade bilateral vertebral flow.  Compared to 03/21/2018, left ICA stenosis of 50-69% not present.  Follow up studies when clinically indicated.  See enclosed images.  Echocardiogram 09/13/2017: Left ventricle cavity is normal in size. Mild concentric hypertrophy of the left ventricle. Normal global wall motion. Doppler evidence of grade I (impaired) diastolic dysfunction, normal LAP. Calculated EF 68%. Mild tricuspid regurgitation. Estimated pulmonary artery systolic pressure  24 mmHg.  Coronary angiogram 11/15/2016: Diffuse coronary calcification involving all the coronary arteries. Very short left main with mild calcification.  D1 ostial 60-70% stenosis.  Mid ramus intermediate 60% stenosis. Otherwise moderate scattered disease other vessels. Normal LVEF, EF estimated at 60%.  EKG   EKG 11/04/2020: Sinus rhythm at a rate of 64 bpm.  Left axis, left anterior fascicular block.  Right bundle branch block.  Bifascicular block.  EKG 06/17/2020: Sinus rhythm at a rate of 61 bpm.  Normal axis.  Right bundle branch block. No evidence of ischemia. Compared to 09/20/2019, no significant change.   EKG 09/20/2019: Normal sinus rhythm with rate of 57 bpm, normal axis, right bundle branch block.  No evidence of ischemia.   EKG 03/30/2018: Normal sinus rhythm at rate of 63 bpm, left axis deviation, left anterior fascicular block.  Right bundle branch block.  No evidence of ischemia.   Assessment     ICD-10-CM   1. Coronary artery disease of native artery of native heart with stable angina pectoris ([place])  I25.118     2. Hx of CABG  Z95.1     3. Primary hypertension  I10     4. Hypercholesteremia  E78.00        No orders of the defined types were placed in this encounter.   Medications Discontinued During This Encounter  Medication Reason   telmisartan (MICARDIS) 80 MG tablet Error   traMADol (ULTRAM) 50 MG tablet Error    Recommendations:   ALEXSANDER CAVINS  is a  85 y.o. African-American male with history of hypertension, hyperlipidemia, diabetes mellitus, remote history of prostate cancer, obesity. Coronary angiography on 11/15/2016 had revealed no high-grade stenosis but moderate disease in multiple vessels.  Repeat coronary angiography on 08/18/2020 by Dr. Einar Gip revealed occlusion of the ostial LCx, attempted PCI was unsuccessful.  Patient therefore underwent repeat coronary angiography by Dr. Martinique 09/30/2020 revealing severe multivessel CAD, therefore PCI was not  attempted.  Patient was then referred to cardiothoracic surgery for evaluation of CABG.  Patient underwent elective CABG x4 By Dr. Orvan Seen on 10/12/2020.  Patient presents for 3 month follow up of CAD and hypertension. At last visit office visit added furosemide 20 mg once daily as needed for edema/fluid overload.  Also ordered BNP to be done in PCPs office, however is not available for review.  Patient is presently asymptomatic and without clinical evidence of heart failure.  Cough has resolved since last office visit.  Patient has recently had lab testing with PCP, will request results accordingly.  Patient is euvolemic overall, recommend he continue furosemide 20 mg once daily.  No changes were made to his medications at this time.  Blood pressure is well controlled.  Follow-up in 6 months, sooner if needed, for CAD, hypertension.   Mark Berthold, PA-C 03/17/2021, 4:38 PM Office: [phone]   Addendum 03/19/2021: External labs  12/17/2020: Glucose 221, BUN 24, creatinine 1.59, GFR 43, sodium 136, potassium 4.5, Total cholesterol 132, triglycerides 75, HDL 54, LDL 78  Discussed with patient recommend adding Zetia with LDL goal <70. Patient will think abot and call our office back to discuss further.    Mark Berthold, PA-C 03/19/2021, 9:07 AM Office: [phone]

## 2021-03-22 ENCOUNTER — Other Ambulatory Visit: Payer: Self-pay | Admitting: Student

## 2021-03-22 DIAGNOSIS — Z951 Presence of aortocoronary bypass graft: Secondary | ICD-10-CM

## 2021-03-22 MED ORDER — EZETIMIBE 10 MG PO TABS
10.0000 mg | ORAL_TABLET | Freq: Every day | ORAL | 11 refills | Status: DC
Start: 2021-03-22 — End: 2022-03-24

## 2021-04-10 ENCOUNTER — Other Ambulatory Visit: Payer: Self-pay | Admitting: Cardiology

## 2021-04-29 ENCOUNTER — Telehealth: Payer: Self-pay

## 2021-04-29 NOTE — Telephone Encounter (Signed)
Pts wife called and stated that the pt was complaining about pain around incision site. Pt was complaining of dizziness but now it is on and off. Pt is okay when he sits down. No chest pain and no SOB. Pt is being scheduled to come in tomorrow. Pts wife said if it gets any worse she will call EMS.  Please advise.

## 2021-04-30 NOTE — Telephone Encounter (Signed)
Called and spoke to pts wife, she voiced understanding.

## 2021-04-30 NOTE — Telephone Encounter (Signed)
Please advise patient to monitor closely until office visit. Request the he bring home BP log to office visit. Also be sure to plan to check orthostatics when he is here for OV.

## 2021-05-03 ENCOUNTER — Encounter: Payer: Self-pay | Admitting: Student

## 2021-05-03 ENCOUNTER — Other Ambulatory Visit: Payer: Self-pay

## 2021-05-03 ENCOUNTER — Ambulatory Visit: Payer: Medicare Other | Admitting: Student

## 2021-05-03 VITALS — BP 116/48 | HR 56 | Temp 97.9°F | Ht 66.0 in | Wt 222.0 lb

## 2021-05-03 DIAGNOSIS — I25118 Atherosclerotic heart disease of native coronary artery with other forms of angina pectoris: Secondary | ICD-10-CM | POA: Diagnosis not present

## 2021-05-03 DIAGNOSIS — I10 Essential (primary) hypertension: Secondary | ICD-10-CM | POA: Diagnosis not present

## 2021-05-03 MED ORDER — NITROGLYCERIN 0.4 MG SL SUBL: 0.4000 mg | SUBLINGUAL_TABLET | SUBLINGUAL | 3 refills | Status: AC | PRN

## 2021-05-03 NOTE — Progress Notes (Signed)
Primary Physician/Referring:  Seward Carol, MD  [name], male    DOB: Feb 19, 1936, 85 y.o.   MRN: [id number]  Chief Complaint  Patient presents with   Coronary Artery Disease   Dizziness   Follow-up    HPI:    Mark Elliott  is a 85 y.o. African-American male with history of hypertension, hyperlipidemia, diabetes mellitus, remote history of prostate cancer, obesity. Coronary angiography on 11/15/2016 had revealed no high-grade stenosis but moderate disease in multiple vessels.  Repeat coronary angiography on 08/18/2020 by Dr. Einar Gip revealed occlusion of the ostial LCx, attempted PCI was unsuccessful.  Patient therefore underwent repeat coronary angiography by Dr. Martinique 09/30/2020 revealing severe multivessel CAD, therefore PCI was not attempted.  Patient was then referred to cardiothoracic surgery for evaluation of CABG.  Patient underwent elective CABG x4 By Dr. Orvan Seen on 10/12/2020.  Presents for urgent visit with concerns of dizziness, particularly upon standing.  Patient is accompanied by his wife at today's office visit.  He states for the last several weeks he has noticed worsening dizziness, particularly upon standing.  Denies syncope or near syncope.  He also notes a single episode of chest pain located around his sternotomy incision that lasted for 2 to 3 minutes when getting out of a car approximately 3 weeks ago.  He has had no recurrence of chest pain since then.  Denies dyspnea, palpitations, syncope, near syncope, orthopnea, PND, leg swelling.  Past Medical History:  Diagnosis Date   Cancer [facility])    Prostate   Coronary artery disease    Diabetes mellitus without complication ([place])    Dyspnea    with exterion   Hyperlipidemia    Hypertension    Past Surgical History:  Procedure Laterality Date   ANTERIOR FUSION CERVICAL SPINE     CORONARY ANGIOGRAPHY N/A 09/30/2020   Procedure: CORONARY ANGIOGRAPHY;  Surgeon: Martinique, Peter M, MD;  Location: [place] [facility];  Service: Cardiovascular;  Laterality: N/A;   CORONARY ARTERY BYPASS GRAFT N/A 10/12/2020   Procedure: CORONARY ARTERY BYPASS GRAFTING (CABG) TIMES FOUR USING LEFT INTERNAL MAMMARY ARTERY AND RIGHT GREATER SAPHENOUS VEIN HARVESTED ENDOSCOPICALLY;  Surgeon: Wonda Olds, MD;  Location: [place];  Service: Open Heart Surgery;  Laterality: N/A;   HERNIA REPAIR     INTRAVASCULAR PRESSURE WIRE/FFR STUDY N/A 09/30/2020   Procedure: INTRAVASCULAR PRESSURE WIRE/FFR STUDY;  Surgeon: Martinique, Peter M, MD;  Location: [place] [facility];  Service: Cardiovascular;  Laterality: N/A;   LEFT HEART CATH AND CORONARY ANGIOGRAPHY N/A 11/15/2016   Procedure: Left Heart Cath and Coronary Angiography;  Surgeon: [name], MD;  Location: [place] [facility];  Service: Cardiovascular;  Laterality: N/A;   LEFT HEART CATH AND CORONARY ANGIOGRAPHY N/A 08/18/2020   Procedure: LEFT HEART CATH AND CORONARY ANGIOGRAPHY;  Surgeon: [name], MD;  Location: [place] [facility];  Service: Cardiovascular;  Laterality: N/A;   PROSTATE ABLATION     ROBOT ASSISTED LAPAROSCOPIC RADICAL PROSTATECTOMY  06/13/2007   TEE WITHOUT CARDIOVERSION N/A 10/12/2020   Procedure: TRANSESOPHAGEAL ECHOCARDIOGRAM (TEE);  Surgeon: Wonda Olds, MD;  Location: [place];  Service: Open Heart Surgery;  Laterality: N/A;   Family History  Problem Relation Age of Onset   Cancer Father        liver   Cancer Brother        pancreatic    Social History   Tobacco Use   Smoking status: Former    Years: 5.00  Types: Cigarettes    Quit date: 02/13/1974    Years since quitting: 47.2   Smokeless tobacco: Never  Substance Use Topics   Alcohol use: No  Marital Status: Married   ROS  Review of Systems  Cardiovascular:  Positive for chest pain (sinlge episode lasting 2-3 minutes) and dyspnea on exertion (improved since last visit ). Negative for claudication, leg swelling, near-syncope, orthopnea, palpitations, paroxysmal nocturnal dyspnea and  syncope.  Respiratory:  Negative for shortness of breath.   Neurological:  Positive for dizziness.  Objective  Blood pressure (!) 116/48, pulse (!) 56, temperature 97.9 F (36.6 C), temperature source Temporal, height _0  (1.676 m), weight 222 lb (100.7 kg), SpO2 99 %.  Vitals with BMI 05/03/2021 03/17/2021 03/17/2021  Height _1  - _2   Weight 222 lbs - 225 lbs 13 oz  BMI 56.86 - 16.83  Systolic 729 021 115  Diastolic 48 53 58  Pulse 56 45 55    Orthostatic VS for the past 72 hrs (Last 3 readings):  Orthostatic BP Patient Position BP Location Cuff Size Orthostatic Pulse  05/03/21 1503 103/42 Standing Left Arm Large 60  05/03/21 1502 100/48 Sitting Left Arm Large 56  05/03/21 1501 107/50 Supine Left Arm Large 54     Physical Exam Vitals reviewed.  Cardiovascular:     Rate and Rhythm: Normal rate and regular rhythm.     Pulses:          Carotid pulses are 2+ on the right side and 2+ on the left side with bruit.      Radial pulses are 2+ on the right side and 2+ on the left side.       Dorsalis pedis pulses are 1+ on the right side and 1+ on the left side.       Posterior tibial pulses are 1+ on the right side and 1+ on the left side.     Heart sounds: Normal heart sounds, S1 normal and S2 normal. No murmur heard.   No gallop.     Comments:  No JVD. Pulmonary:     Effort: Pulmonary effort is normal. No respiratory distress.     Breath sounds: Normal breath sounds. No wheezing, rhonchi or rales.  Musculoskeletal:     Right lower leg: No edema.     Left lower leg: No edema.  Skin:    Comments: Sternotomy incision site and right calf graft sites well healed. No evidence of erythema, drainage, bleeding.  Neurological:     Mental Status: He is alert.    Laboratory examination:   Recent Labs    08/10/20 0811 08/18/20 0659 10/14/20 0429 10/15/20 0500 10/20/20 1400  NA 133*   < > 133* 135 133*  K 5.6*   < > 4.1 3.7 5.1  CL 98   < > 103 102 101  CO2 23   < > _3 GLUCOSE 191*   < > 83 140* 269*  BUN 21   < > _4 CREATININE 1.39*   < > 1.16 1.17 1.20  CALCIUM 10.0   < > 8.4* 8.1* 8.8*  GFRNONAA 47*   < > >60 >60 >60  GFRAA 54*  --   --   --   --    < > = values in this interval not displayed.   CrCl cannot be calculated (Patient's most recent lab result is older than the maximum 21 days allowed.).  CMP Latest Ref Rng &  Units 10/20/2020 10/15/2020 10/14/2020  Glucose 70 - 99 mg/dL 269(H) 140(H) 83  BUN 8 - 23 mg/dL _0 Creatinine 0.61 - 1.24 mg/dL 1.20 1.17 1.16  Sodium 135 - 145 mmol/L 133(L) 135 133(L)  Potassium 3.5 - 5.1 mmol/L 5.1 3.7 4.1  Chloride 98 - 111 mmol/L 101 102 103  CO2 22 - 32 mmol/L _1 Calcium 8.9 - 10.3 mg/dL 8.8(L) 8.1(L) 8.4(L)  Total Protein 6.5 - 8.1 g/dL 5.8(L) - -  Total Bilirubin 0.3 - 1.2 mg/dL 1.0 - -  Alkaline Phos 38 - 126 U/L 48 - -  AST 15 - 41 U/L 16 - -  ALT 0 - 44 U/L 16 - -   CBC Latest Ref Rng & Units 10/20/2020 10/15/2020 10/14/2020  WBC 4.0 - 10.5 K/uL 7.7 6.3 6.3  Hemoglobin 13.0 - 17.0 g/dL 10.0(L) 8.8(L) 9.0(L)  Hematocrit 39.0 - 52.0 % 30.8(L) 26.3(L) 27.1(L)  Platelets 150 - 400 K/uL 398 154 124(L)   Lipid Panel     Component Value Date/Time   CHOL 101 10/16/2020 0232   TRIG 73 10/16/2020 0232   HDL 41 10/16/2020 0232   CHOLHDL 2.5 10/16/2020 0232   VLDL 15 10/16/2020 0232   LDLCALC 45 10/16/2020 0232   HEMOGLOBIN A1C Lab Results  Component Value Date   HGBA1C 9.0 (H) 10/08/2020   MPG 211.6 10/08/2020   TSH No results for input(s): TSH in the last 8760 hours.  External labs:  05/28/2020: HDL 53, LDL 71, total cholesterol 138, triglycerides 64 A1c 8.0% BUN 19, creatinine 1.22, EGFR 60 TSH 1.93  08/19/2019: A1c 8.7%.  TSH normal. Serum glucose 55 mg, BUN 18, creatinine 1.0, EGFR >60 mL, potassium 4.0.  CMP otherwise normal. Total cholesterol 140, triglycerides 51, HDL 55, LDL 74.  Non-HDL cholesterol 85.  03/08/2018: TSH 2.26.  Hemoglobin 13.0, CBC otherwise normal.   Hemoglobin A1c 8.5%.  Potassium 4.4, glucose 123, creatinine 1.09, EGFR 65/79, CMP otherwise normal.  Cholesterol 138, triglycerides 43, HDL 56, LDL 73.  Allergies  No Known Allergies    Medications Prior to Visit:   Outpatient Medications Prior to Visit  Medication Sig Dispense Refill   aspirin EC 325 MG EC tablet Take 1 tablet (325 mg total) by mouth daily.     ezetimibe (ZETIA) 10 MG tablet Take 1 tablet (10 mg total) by mouth daily. 30 tablet 11   JARDIANCE 10 MG TABS tablet Take 10 mg by mouth daily.     LANTUS SOLOSTAR 100 UNIT/ML Solostar Pen Inject 16 Units into the skin daily.     metFORMIN (GLUCOPHAGE-XR) 500 MG 24 hr tablet Take 2 tablets (1,000 mg total) by mouth 2 (two) times daily.     metoprolol tartrate (LOPRESSOR) 25 MG tablet Take 1 tablet (25 mg total) by mouth 2 (two) times daily. 60 tablet 2   olmesartan (BENICAR) 20 MG tablet TAKE 1/2 TABLET BY MOUTH DAILY 45 tablet 0   pravastatin (PRAVACHOL) 80 MG tablet Take 1 tablet (80 mg total) by mouth every evening. 90 tablet 3   amLODipine (NORVASC) 5 MG tablet Take 5 mg by mouth daily.     ACCU-CHEK AVIVA PLUS test strip Inject 1 application as directed 2 (two) times daily.     furosemide (LASIX) 20 MG tablet Take 1 tablet (20 mg total) by mouth daily as needed for fluid or edema. (Patient not taking: Reported on 05/03/2021) 30 tablet 3   No facility-administered medications prior to visit.  Final Medications at End of Visit    Current Meds  Medication Sig   aspirin EC 325 MG EC tablet Take 1 tablet (325 mg total) by mouth daily.   ezetimibe (ZETIA) 10 MG tablet Take 1 tablet (10 mg total) by mouth daily.   JARDIANCE 10 MG TABS tablet Take 10 mg by mouth daily.   LANTUS SOLOSTAR 100 UNIT/ML Solostar Pen Inject 16 Units into the skin daily.   metFORMIN (GLUCOPHAGE-XR) 500 MG 24 hr tablet Take 2 tablets (1,000 mg total) by mouth 2 (two) times daily.   metoprolol tartrate (LOPRESSOR) 25 MG tablet Take 1 tablet (25  mg total) by mouth 2 (two) times daily.   nitroGLYCERIN (NITROSTAT) 0.4 MG SL tablet Place 1 tablet (0.4 mg total) under the tongue every 5 (five) minutes as needed for chest pain.   olmesartan (BENICAR) 20 MG tablet TAKE 1/2 TABLET BY MOUTH DAILY   pravastatin (PRAVACHOL) 80 MG tablet Take 1 tablet (80 mg total) by mouth every evening.   [DISCONTINUED] amLODipine (NORVASC) 5 MG tablet Take 5 mg by mouth daily.    Radiology:   No results found.  Cardiac Studies:   Coronary Artery Bypass Grafting x 4 10/12/2020: Left Internal Mammary Artery to Distal Left Anterior Descending Coronary Artery;  Saphenous Vein Graft to  Posterior Descending Coronary Artery;  Saphenous Vein Graft to 1st Obtuse Marginal Branch of Left Circumflex Coronary Artery;  Sapheonous Vein Graft to 2nd Obtuse Marginal Branch of Left Circumflex Coronary Artery;  Endoscopic Vein Harvest from right thigh and Lower Leg  Coronary Angiography 09/30/2020:  Ost LM to Mid LM lesion is 40% stenosed. Ost LAD to Prox LAD lesion is 75% stenosed. Ost Cx to Prox Cx lesion is 100% stenosed. 1. Severe multivessel CAD with CTO of the ostial LCx and significant proximal LAD disease by iFR of 0.71 Plan: consider CABG  Left Heart Catheterization 08/18/20: LV: Normal LV systolic function. Normal EDP. No pressure gradient across the aortic valve. Left main: Very short. Mildly calcified. LAD: Large caliber vessel. Has moderate diffuse coronary calcification. Proximal segment has a 30 to 35% stenosis and mid to distal segment is diffusely diseased and focal 30% stenosis in the distal segment. No significant change from prior angiography 2018. Circumflex: Very large caliber vessel and a large vessel. Heavily calcified and flush occluded at the ostium. Faint TIMI I flow is evident distally. Collaterals are evident from the RCA to the circumflex. RCA: Dominant. Mild to moderate disease in the proximal segment with calcification. Tortuous. Distal  right has calcific 60% stenosis, again not significantly changed from prior angiography in 2018. Collaterals are noted to the circumflex. Attempted angioplasty and recommendations: In spite of aggressive attempted angioplasty, failed attempt. I will refer the patient to Dr. Peter Martinique to see whether she would be a candidate for attempted revascularization of the CTO, he has gracefully reviewed the angiograms and is willing to see the patient in the outpatient basis first. 85 mL contrast utilized. He will be discharged home with outpatient follow-up.  PCV ECHOCARDIOGRAM COMPLETE 60/63/0160 Normal LV systolic function with visual EF 55-60%. Left ventricle cavity is normal in size. Mild left ventricular hypertrophy. Normal global wall motion. Indeterminate diastolic filling pattern, normal LAP. Mild (Grade I) mitral regurgitation. Mild tricuspid regurgitation. Compared to prior study dated 09/13/2017 no significant change.  PCV MYOCARDIAL PERFUSION WO LEXISCAN 06/29/2020 Exercise nuclear stress test was performed using Bruce protocol. Patient reached 7.2 METS, and 86% of age predicted maximum heart rate. Exercise capacity  was low. No chest pain reported. Heart rate and hemodynamic response were normal. Peak stress EKG showed sinus tachycardia, RBBB, nonspecific T wave changes inferior leads. SPECT images showed large sized, severe intensity, mid to basal, predominantly reversible perfusion defect in anterolateral, inferolateral myocardium. Intermediate risk study.  Carotid artery duplex  10/25/2019:  Diffuse homogeneous plaque in bilateral common carotid arteries with <50%  stenosis.  Bilateral external carotid stenosis <50%.  Antegrade bilateral vertebral flow.  Compared to 03/21/2018, left ICA stenosis of 50-69% not present.  Follow up studies when clinically indicated.  See enclosed images.  Echocardiogram 09/13/2017: Left ventricle cavity is normal in size. Mild concentric hypertrophy of  the left ventricle. Normal global wall motion. Doppler evidence of grade I (impaired) diastolic dysfunction, normal LAP. Calculated EF 68%. Mild tricuspid regurgitation. Estimated pulmonary artery systolic pressure 24 mmHg.  Coronary angiogram 11/15/2016: Diffuse coronary calcification involving all the coronary arteries. Very short left main with mild calcification.  D1 ostial 60-70% stenosis.  Mid ramus intermediate 60% stenosis. Otherwise moderate scattered disease other vessels. Normal LVEF, EF estimated at 60%.  EKG  05/03/2021: Sinus bradycardia at a rate of 52 bpm.  Left axis, left anterior fascicular block.  Right bundle branch block.  No evidence of ischemia or underlying injury pattern.  Compared EKG 11/04/2020, no significant change.  EKG 06/17/2020: Sinus rhythm at a rate of 61 bpm.  Normal axis.  Right bundle branch block. No evidence of ischemia. Compared to 09/20/2019, no significant change.   EKG 09/20/2019: Normal sinus rhythm with rate of 57 bpm, normal axis, right bundle branch block.  No evidence of ischemia.   EKG 03/30/2018: Normal sinus rhythm at rate of 63 bpm, left axis deviation, left anterior fascicular block.  Right bundle branch block.  No evidence of ischemia.   Assessment     ICD-10-CM   1. Coronary artery disease of native artery of native heart with stable angina pectoris ([place])  I25.118 EKG 12-Lead    2. Primary hypertension  I10        Meds ordered this encounter  Medications   nitroGLYCERIN (NITROSTAT) 0.4 MG SL tablet    Sig: Place 1 tablet (0.4 mg total) under the tongue every 5 (five) minutes as needed for chest pain.    Dispense:  30 tablet    Refill:  3     Medications Discontinued During This Encounter  Medication Reason   amLODipine (NORVASC) 5 MG tablet Side effect (s)    Recommendations:   Mark Elliott  is a  85 y.o. African-American male with history of hypertension, hyperlipidemia, diabetes mellitus, remote history of prostate  cancer, obesity. Coronary angiography on 11/15/2016 had revealed no high-grade stenosis but moderate disease in multiple vessels.  Repeat coronary angiography on 08/18/2020 by Dr. Einar Gip revealed occlusion of the ostial LCx, attempted PCI was unsuccessful.  Patient therefore underwent repeat coronary angiography by Dr. Martinique 09/30/2020 revealing severe multivessel CAD, therefore PCI was not attempted.  Patient was then referred to cardiothoracic surgery for evaluation of CABG.  Patient underwent elective CABG x4 By Dr. Orvan Seen on 10/12/2020.  Patient presents for urgent visit with concerns of dizziness and a single episode of brief chest pain.  Given patient's chest pain symptoms are atypical at best and EKG is unchanged recommend watchful waiting.  Counseled patient regarding signs and symptoms that would warrant urgent or emergent evaluation.  Also refilled nitroglycerin prescription. S/L NTG was prescribed and explained how to and when to use it and to notify  us if there is change in frequency of use. Interaction with cialis-like agents was discussed.   In regard to dizziness, patient's blood pressure is quite soft.  We will therefore discontinue amlodipine.  Patient will continue to monitor blood pressure on a regular basis at home and he will notify our office if he continues to have episodes of dizziness.  Will keep follow-up appointment in March 2023, unless he needs to be seen sooner.   Alethia Berthold, PA-C 05/04/2021, 12:22 PM Office: [phone]

## 2021-05-04 ENCOUNTER — Encounter: Payer: Self-pay | Admitting: Student

## 2021-05-06 ENCOUNTER — Telehealth: Payer: Self-pay

## 2021-05-06 NOTE — Telephone Encounter (Signed)
If med list is accurate please tell him to stop jardiance. Will consider stopping olmesartan or metoprolol if stopping jardiance does not improve dizziness.

## 2021-05-06 NOTE — Telephone Encounter (Signed)
Pt's wife called  Bp readings 95/69  sitting 120/58 standing 141/79 last night  Still having dizziness

## 2021-05-10 NOTE — Telephone Encounter (Signed)
Will you please follow up on this?

## 2021-05-11 NOTE — Telephone Encounter (Signed)
Called pt, no answer. Unable to leave VM requesting call back.

## 2021-05-12 DIAGNOSIS — Z23 Encounter for immunization: Secondary | ICD-10-CM | POA: Diagnosis not present

## 2021-05-12 NOTE — Telephone Encounter (Signed)
Discussed further patient is indeed taking Zetia

## 2021-05-12 NOTE — Telephone Encounter (Signed)
Called pts wife, he is taking all the medications listed except for the ezetimibe. She stated she was never told about this medication. Is he still suppose to be on this? I informed her to have him stop the jardiance.

## 2021-05-26 ENCOUNTER — Telehealth: Payer: Self-pay

## 2021-05-26 ENCOUNTER — Other Ambulatory Visit: Payer: Self-pay | Admitting: Student

## 2021-05-26 MED ORDER — HYDRALAZINE HCL 25 MG PO TABS: 25.0000 mg | ORAL_TABLET | Freq: Three times a day (TID) | ORAL | 3 refills | Status: DC | PRN

## 2021-05-26 NOTE — Progress Notes (Signed)
Patient has noticed occasional blood pressure spikes at home since stopping amlodipine. Will start PRN hydralazine 25 mg up to 3 times daily for blood pressure >150/90 mmHg.   Will also enroll patient is remote patient blood pressure monitoring.    Alethia Berthold, PA-C 05/26/2021, 3:35 PM Office: [phone]

## 2021-05-26 NOTE — Telephone Encounter (Signed)
I have added hydralazine PRN.   Mark Elliott, would it be okay if we set patient up for nurse visit to enroll in remote BP monitoring.

## 2021-05-26 NOTE — Telephone Encounter (Signed)
Pts wife called and stated that the pt has been having high BP recently. His systolic number has been high and his diastolic has been normal. His BP is usually around 153/67. She stated that the other day his BP went up to 218/86. She isn't sure if it her BP cuff or if his BP is actually that high. They said that he was directed to take him amlodipine if his BP gets above 140/90. He still experiences the dizziness when he does this. They are unsure of what to do. Please advise.

## 2021-05-27 NOTE — Telephone Encounter (Signed)
Mark Elliott spoke to pt regarding hydralazine. Pt aware.

## 2021-05-28 ENCOUNTER — Telehealth: Payer: Self-pay | Admitting: Pharmacist

## 2021-05-28 NOTE — Telephone Encounter (Signed)
CARE PLAN ENTRY  05/28/2021 Name: Mark Elliott MRN: [id number] DOB: 10-31-1935  Mark Elliott is enrolled in Remote Patient Monitoring/Principle Care Monitoring.  Date of Enrollment: 05/28/21 Supervising physician: [name] Indication: HTN  Remote Readings:  New Start.  Next scheduled OV: 09/14/21  Pharmacist Clinical Goal(s):  Over the next 90 days, patient will demonstrate Improved medication adherence as evidenced by medication fill history Over the next 90 days, patient will demonstrate improved understanding of prescribed medications and rationale for usage as evidenced by patient teach back Over the next 90 days, patient will experience decrease in ED visits. ED visits in last 6 months = 0 Over the next 90 days, patient will not experience hospital admission. Hospital Admissions in last 6 months = 0  Interventions: Provider and Inter-disciplinary care team collaboration (see longitudinal plan of care) Comprehensive medication review performed. Discussed plans with patient for ongoing care management follow up and provided patient with direct contact information for care management team Collaboration with provider re: medication management  Patient Self Care Activities:  Self administers medications as prescribed Attends all scheduled provider appointments Performs ADL's independently Performs IADL's independently  No Known Allergies Outpatient Encounter Medications as of 05/28/2021  Medication Sig   ACCU-CHEK AVIVA PLUS test strip Inject 1 application as directed 2 (two) times daily.   aspirin EC 325 MG EC tablet Take 1 tablet (325 mg total) by mouth daily.   ezetimibe (ZETIA) 10 MG tablet Take 1 tablet (10 mg total) by mouth daily.   hydrALAZINE (APRESOLINE) 25 MG tablet Take 1 tablet (25 mg total) by mouth 3 (three) times daily as needed (For blood pressure >150/90 mmHg).   LANTUS SOLOSTAR 100 UNIT/ML Solostar Pen Inject 16 Units into the skin daily.    metFORMIN (GLUCOPHAGE-XR) 500 MG 24 hr tablet Take 2 tablets (1,000 mg total) by mouth 2 (two) times daily.   methocarbamol (ROBAXIN) 500 MG tablet Take 500 mg by mouth 2 (two) times daily.   metoprolol tartrate (LOPRESSOR) 25 MG tablet Take 1 tablet (25 mg total) by mouth 2 (two) times daily. (Patient taking differently: Take 12.5 mg by mouth 2 (two) times daily.)   nitroGLYCERIN (NITROSTAT) 0.4 MG SL tablet Place 1 tablet (0.4 mg total) under the tongue every 5 (five) minutes as needed for chest pain.   olmesartan (BENICAR) 20 MG tablet TAKE 1/2 TABLET BY MOUTH DAILY (Patient taking differently: Taking in the morning)   pravastatin (PRAVACHOL) 80 MG tablet Take 1 tablet (80 mg total) by mouth every evening.   furosemide (LASIX) 20 MG tablet Take 1 tablet (20 mg total) by mouth daily as needed for fluid or edema. (Patient not taking: Reported on 05/03/2021)   JARDIANCE 10 MG TABS tablet Take 10 mg by mouth daily. (Patient not taking: Reported on 05/28/2021)   No facility-administered encounter medications on file as of 05/28/2021.    Hypertension   BP goal is:  <130/80  Office blood pressures are  BP Readings from Last 3 Encounters:  05/03/21 (!) 116/48  03/17/21 (!) 130/53  12/15/20 (!) 129/56    Patient is currently uncontrolled on the following medications: Hydralazine 25 mg PRN (SBP >150), Olmesartan 10 mg, metoprolol 12.5 mg BID, Lasix 20 mg PRN.  Patient checks BP at home twice daily  Patient home BP readings are ranging: N/A  Patient has tried  these meds in the past: amlodipine, metoprolol succ, Imdur, valsartan-HCTZ, telmisartan  We discussed diet and exercise extensively and Salt intake reduction  Plan  Continue current medications and control with diet and exercise  Reviewed home BP monitor readings of 168/67 HR: 52 and office BP readings of 140/75 HR: 54. Pt states the home BP monitor has been 73-8 years old. Unsure if home BP readings are accurate. Provided pt with  remote BP monitor to monitor home BP readings more accurately. Reviewed and updated med list together. Pt noted to have concerns of possible orthostatic hypotension. Pt complains of feeling dizzy and lightheaded whenever changing positions. Reviewed mitigating techniques such as changing positions slowly,wearing compressions stockings, making sure to staying well hydrated. Recommended pt to also reduce salt intake to <2000 mg/day and increasing physical activity level to ~30 mins/day for goal of 150 mins/week. Pt agreed to starting to walk regularly at work and consider returning to his senior activity center routine.   ______________ Visit Information SDOH (Social Determinants of Health) assessments performed: Yes.  Mr. Lance was given information about Principle Care Management/Remote Patient Monitoring services today including:  RPM/PCM service includes personalized support from designated clinical staff supervised by his physician, including individualized plan of care and coordination with other care providers 24/7 contact phone numbers for assistance for urgent and routine care needs. Standard insurance, coinsurance, copays and deductibles apply for principle care management only during months in which we provide at least 30 minutes of these services. Most insurances cover these services at 100%, however patients may be responsible for any copay, coinsurance and/or deductible if applicable. This service may help you avoid the need for more expensive face-to-face services. Only one practitioner may furnish and bill the service in a calendar month. The patient may stop PCM/RPM services at any time (effective at the end of the month) by phone call to the office staff.  Patient agreed to services and verbal consent obtained.   Manuela Schwartz, Pharm.D. [facility] [phone] Ext: [phone]

## 2021-06-14 DIAGNOSIS — Z794 Long term (current) use of insulin: Secondary | ICD-10-CM | POA: Diagnosis not present

## 2021-06-14 DIAGNOSIS — E7800 Pure hypercholesterolemia, unspecified: Secondary | ICD-10-CM | POA: Diagnosis not present

## 2021-06-14 DIAGNOSIS — E1169 Type 2 diabetes mellitus with other specified complication: Secondary | ICD-10-CM | POA: Diagnosis not present

## 2021-06-14 DIAGNOSIS — I25110 Atherosclerotic heart disease of native coronary artery with unstable angina pectoris: Secondary | ICD-10-CM | POA: Diagnosis not present

## 2021-06-14 DIAGNOSIS — Z951 Presence of aortocoronary bypass graft: Secondary | ICD-10-CM | POA: Diagnosis not present

## 2021-06-14 DIAGNOSIS — Z1389 Encounter for screening for other disorder: Secondary | ICD-10-CM | POA: Diagnosis not present

## 2021-06-14 DIAGNOSIS — I10 Essential (primary) hypertension: Secondary | ICD-10-CM | POA: Diagnosis not present

## 2021-06-14 DIAGNOSIS — Z0000 Encounter for general adult medical examination without abnormal findings: Secondary | ICD-10-CM | POA: Diagnosis not present

## 2021-07-27 ENCOUNTER — Other Ambulatory Visit: Payer: Self-pay

## 2021-07-27 MED ORDER — OLMESARTAN MEDOXOMIL 20 MG PO TABS: 10.0000 mg | ORAL_TABLET | Freq: Every day | ORAL | 0 refills | Status: DC

## 2021-08-15 DIAGNOSIS — I10 Essential (primary) hypertension: Secondary | ICD-10-CM | POA: Diagnosis not present

## 2021-09-01 DIAGNOSIS — M65342 Trigger finger, left ring finger: Secondary | ICD-10-CM | POA: Diagnosis not present

## 2021-09-14 ENCOUNTER — Ambulatory Visit: Payer: Medicare Other | Admitting: Student

## 2021-09-14 DIAGNOSIS — I10 Essential (primary) hypertension: Secondary | ICD-10-CM | POA: Diagnosis not present

## 2021-09-16 NOTE — Progress Notes (Unsigned)
° °Primary Physician/Referring:  [name], [name], MD ° °[name], male    DOB: 08/07/1935, 86 y.o.   MRN: [id number] ° °No chief complaint on file. ° ° °HPI:   ° °Mark Elliott  is a 86 y.o. African-American male with history of hypertension, hyperlipidemia, diabetes mellitus, remote history of prostate cancer, obesity. Coronary angiography on 11/15/2016 had revealed no high-grade stenosis but moderate disease in multiple vessels.  Repeat coronary angiography on 08/18/2020 by Dr. Ganji revealed occlusion of the ostial LCx, attempted PCI was unsuccessful.  Patient therefore underwent repeat coronary angiography by Dr. Jordan 09/30/2020 revealing severe multivessel CAD, therefore PCI was not attempted.  Patient was then referred to cardiothoracic surgery for evaluation of CABG.  Patient underwent elective CABG x4 By Dr. Atkins on 10/12/2020. ° °Patient presents for 6 month follow up.  At last office visit patient's blood pressure was soft, therefore discontinued amlodipine. *** ° °***external labs - lipids good a1c high  ° °Presents for urgent visit with concerns of dizziness, particularly upon standing.  Patient is accompanied by his wife at today's office visit.  He states for the last several weeks he has noticed worsening dizziness, particularly upon standing.  Denies syncope or near syncope.  He also notes a single episode of chest pain located around his sternotomy incision that lasted for 2 to 3 minutes when getting out of a car approximately 3 weeks ago.  He has had no recurrence of chest pain since then. ° °Denies dyspnea, palpitations, syncope, near syncope, orthopnea, PND, leg swelling. ° °Past Medical History:  °Diagnosis Date  ° Cancer (HCC)   ° Prostate  ° Coronary artery disease   ° Diabetes mellitus without complication (HCC)   ° Dyspnea   ° with exterion  ° Hyperlipidemia   ° Hypertension   ° °Past Surgical History:  °Procedure Laterality Date  ° ANTERIOR FUSION CERVICAL SPINE    °  CORONARY ANGIOGRAPHY N/A 09/30/2020  ° Procedure: CORONARY ANGIOGRAPHY;  Surgeon: Jordan, Peter M, MD;  Location: [facility];  Service: Cardiovascular;  Laterality: N/A;  ° CORONARY ARTERY BYPASS GRAFT N/A 10/12/2020  ° Procedure: CORONARY ARTERY BYPASS GRAFTING (CABG) TIMES FOUR USING LEFT INTERNAL MAMMARY ARTERY AND RIGHT GREATER SAPHENOUS VEIN HARVESTED ENDOSCOPICALLY;  Surgeon: Atkins, Broadus Z, MD;  Location: [facility];  Service: Open Heart Surgery;  Laterality: N/A;  ° HERNIA REPAIR    ° INTRAVASCULAR PRESSURE WIRE/FFR STUDY N/A 09/30/2020  ° Procedure: INTRAVASCULAR PRESSURE WIRE/FFR STUDY;  Surgeon: Jordan, Peter M, MD;  Location: [facility];  Service: Cardiovascular;  Laterality: N/A;  ° LEFT HEART CATH AND CORONARY ANGIOGRAPHY N/A 11/15/2016  ° Procedure: Left Heart Cath and Coronary Angiography;  Surgeon: Ganji, Jay, MD;  Location: [facility];  Service: Cardiovascular;  Laterality: N/A;  ° LEFT HEART CATH AND CORONARY ANGIOGRAPHY N/A 08/18/2020  ° Procedure: LEFT HEART CATH AND CORONARY ANGIOGRAPHY;  Surgeon: Ganji, Jay, MD;  Location: [facility];  Service: Cardiovascular;  Laterality: N/A;  ° PROSTATE ABLATION    ° ROBOT ASSISTED LAPAROSCOPIC RADICAL PROSTATECTOMY  06/13/2007  ° TEE WITHOUT CARDIOVERSION N/A 10/12/2020  ° Procedure: TRANSESOPHAGEAL ECHOCARDIOGRAM (TEE);  Surgeon: Atkins, Broadus Z, MD;  Location: [facility];  Service: Open Heart Surgery;  Laterality: N/A;  ° °Family History  °Problem Relation Age of Onset  ° Cancer Father   °     liver  ° Cancer Brother   °     pancreatic  °  °  Social History   Tobacco Use   Smoking status: Former    Years: 5.00    Types: Cigarettes    Quit date: 02/13/1974    Years since quitting: 47.6   Smokeless tobacco: Never  Substance Use Topics   Alcohol use: No  Marital Status: Married   ROS  Review of Systems  Cardiovascular:  Positive for chest pain (sinlge episode lasting 2-3 minutes) and dyspnea on exertion (improved since last visit  ). Negative for claudication, leg swelling, near-syncope, orthopnea, palpitations, paroxysmal nocturnal dyspnea and syncope.  Respiratory:  Negative for shortness of breath.   Neurological:  Positive for dizziness.  Objective  There were no vitals taken for this visit.  Vitals with BMI 05/03/2021 03/17/2021 03/17/2021  Height 5' 6" - 5' 6"  Weight 222 lbs - 225 lbs 13 oz  BMI 27.74 - 12.87  Systolic 867 672 094  Diastolic 48 53 58  Pulse 56 45 55    No data found.    Physical Exam Vitals reviewed.  Cardiovascular:     Rate and Rhythm: Normal rate and regular rhythm.     Pulses:          Carotid pulses are 2+ on the right side and 2+ on the left side with bruit.      Radial pulses are 2+ on the right side and 2+ on the left side.       Dorsalis pedis pulses are 1+ on the right side and 1+ on the left side.       Posterior tibial pulses are 1+ on the right side and 1+ on the left side.     Heart sounds: Normal heart sounds, S1 normal and S2 normal. No murmur heard.   No gallop.     Comments:  No JVD. Pulmonary:     Effort: Pulmonary effort is normal. No respiratory distress.     Breath sounds: Normal breath sounds. No wheezing, rhonchi or rales.  Musculoskeletal:     Right lower leg: No edema.     Left lower leg: No edema.  Skin:    Comments: Sternotomy incision site and right calf graft sites well healed. No evidence of erythema, drainage, bleeding.  Neurological:     Mental Status: He is alert.    Laboratory examination:   Recent Labs    10/14/20 0429 10/15/20 0500 10/20/20 1400  NA 133* 135 133*  K 4.1 3.7 5.1  CL 103 102 101  CO2 _0 GLUCOSE 83 140* 269*  BUN _1 CREATININE 1.16 1.17 1.20  CALCIUM 8.4* 8.1* 8.8*  GFRNONAA >60 >60 >60    CrCl cannot be calculated (Patient's most recent lab result is older than the maximum 21 days allowed.).  CMP Latest Ref Rng & Units 10/20/2020 10/15/2020 10/14/2020  Glucose 70 - 99 mg/dL 269(H) 140(H) 83  BUN 8 - 23  mg/dL _2 Creatinine 0.61 - 1.24 mg/dL 1.20 1.17 1.16  Sodium 135 - 145 mmol/L 133(L) 135 133(L)  Potassium 3.5 - 5.1 mmol/L 5.1 3.7 4.1  Chloride 98 - 111 mmol/L 101 102 103  CO2 22 - 32 mmol/L _3 Calcium 8.9 - 10.3 mg/dL 8.8(L) 8.1(L) 8.4(L)  Total Protein 6.5 - 8.1 g/dL 5.8(L) - -  Total Bilirubin 0.3 - 1.2 mg/dL 1.0 - -  Alkaline Phos 38 - 126 U/L 48 - -  AST 15 - 41 U/L 16 - -  ALT 0 - 44  U/L 16 - -  ° °CBC Latest Ref Rng & Units 10/20/2020 10/15/2020 10/14/2020  °WBC 4.0 - 10.5 K/uL 7.7 6.3 6.3  °Hemoglobin 13.0 - 17.0 g/dL 10.0(L) 8.8(L) 9.0(L)  °Hematocrit 39.0 - 52.0 % 30.8(L) 26.3(L) 27.1(L)  °Platelets 150 - 400 K/uL 398 154 124(L)  ° °Lipid Panel  °   °Component Value Date/Time  ° CHOL 101 10/16/2020 0232  ° TRIG 73 10/16/2020 0232  ° HDL 41 10/16/2020 0232  ° CHOLHDL 2.5 10/16/2020 0232  ° VLDL 15 10/16/2020 0232  ° LDLCALC 45 10/16/2020 0232  ° °HEMOGLOBIN A1C °Lab Results  °Component Value Date  ° HGBA1C 9.0 (H) 10/08/2020  ° MPG 211.6 10/08/2020  ° °TSH °No results for input(s): TSH in the last 8760 hours. ° °External labs:  °06/14/2021: °A1c 8.4% °Hgb 12.5, HCT 37.7, MCV 85.8, platelet 237 °BUN 15, creatinine 1.27, GFR 56, sodium 139, potassium 4.5 °Total cholesterol 108, HDL 55, triglycerides 69, LDL 38 ° °05/28/2020: °HDL 53, LDL 71, total cholesterol 138, triglycerides 64 °A1c 8.0% °BUN 19, creatinine 1.22, EGFR 60 °TSH 1.93 ° °08/19/2019: °A1c 8.7%.  TSH normal. °Serum glucose 55 mg, BUN 18, creatinine 1.0, EGFR >60 mL, potassium 4.0.  CMP otherwise normal. °Total cholesterol 140, triglycerides 51, HDL 55, LDL 74.  Non-HDL cholesterol 85. ° °03/08/2018: TSH 2.26.  Hemoglobin 13.0, CBC otherwise normal.  Hemoglobin A1c 8.5%.  Potassium 4.4, glucose 123, creatinine 1.09, EGFR 65/79, CMP otherwise normal.  Cholesterol 138, triglycerides 43, HDL 56, LDL 73. ° °Allergies  °No Known Allergies  ° ° °Medications Prior to Visit:  ° °Outpatient Medications Prior to Visit  °Medication Sig  Dispense Refill  ° ACCU-CHEK AVIVA PLUS test strip Inject 1 application as directed 2 (two) times daily.    ° aspirin EC 325 MG EC tablet Take 1 tablet (325 mg total) by mouth daily.    ° ezetimibe (ZETIA) 10 MG tablet Take 1 tablet (10 mg total) by mouth daily. 30 tablet 11  ° furosemide (LASIX) 20 MG tablet Take 1 tablet (20 mg total) by mouth daily as needed for fluid or edema. 30 tablet 3  ° hydrALAZINE (APRESOLINE) 25 MG tablet Take 1 tablet (25 mg total) by mouth 3 (three) times daily as needed (For blood pressure >150/90 mmHg). 60 tablet 3  ° LANTUS SOLOSTAR 100 UNIT/ML Solostar Pen Inject 16 Units into the skin daily.    ° metFORMIN (GLUCOPHAGE-XR) 500 MG 24 hr tablet Take 2 tablets (1,000 mg total) by mouth 2 (two) times daily.    ° methocarbamol (ROBAXIN) 500 MG tablet Take 500 mg by mouth 2 (two) times daily.    ° metoprolol tartrate (LOPRESSOR) 25 MG tablet Take 1 tablet (25 mg total) by mouth 2 (two) times daily. (Patient taking differently: Take 12.5 mg by mouth 2 (two) times daily.) 60 tablet 2  ° nitroGLYCERIN (NITROSTAT) 0.4 MG SL tablet Place 1 tablet (0.4 mg total) under the tongue every 5 (five) minutes as needed for chest pain. 30 tablet 3  ° olmesartan (BENICAR) 20 MG tablet Take 0.5 tablets (10 mg total) by mouth daily. 45 tablet 0  ° pravastatin (PRAVACHOL) 80 MG tablet Take 1 tablet (80 mg total) by mouth every evening. 90 tablet 3  ° °No facility-administered medications prior to visit.  ° ° ° °Final Medications at End of Visit   ° °No outpatient medications have been marked as taking for the 09/17/21 encounter (Appointment) with Cantwell, Celeste C, PA-C.  ° ° °Radiology:  ° °  No results found.  Cardiac Studies:   Coronary Artery Bypass Grafting x 4 10/12/2020: Left Internal Mammary Artery to Distal Left Anterior Descending Coronary Artery;  Saphenous Vein Graft to  Posterior Descending Coronary Artery;  Saphenous Vein Graft to 1st Obtuse Marginal Branch of Left Circumflex Coronary  Artery;  Sapheonous Vein Graft to 2nd Obtuse Marginal Branch of Left Circumflex Coronary Artery;  Endoscopic Vein Harvest from right thigh and Lower Leg  Coronary Angiography 09/30/2020:  Ost LM to Mid LM lesion is 40% stenosed. Ost LAD to Prox LAD lesion is 75% stenosed. Ost Cx to Prox Cx lesion is 100% stenosed. 1. Severe multivessel CAD with CTO of the ostial LCx and significant proximal LAD disease by iFR of 0.71 Plan: consider CABG  Left Heart Catheterization 08/18/20: LV: Normal LV systolic function. Normal EDP. No pressure gradient across the aortic valve. Left main: Very short. Mildly calcified. LAD: Large caliber vessel. Has moderate diffuse coronary calcification. Proximal segment has a 30 to 35% stenosis and mid to distal segment is diffusely diseased and focal 30% stenosis in the distal segment. No significant change from prior angiography 2018. Circumflex: Very large caliber vessel and a large vessel. Heavily calcified and flush occluded at the ostium. Faint TIMI I flow is evident distally. Collaterals are evident from the RCA to the circumflex. RCA: Dominant. Mild to moderate disease in the proximal segment with calcification. Tortuous. Distal right has calcific 60% stenosis, again not significantly changed from prior angiography in 2018. Collaterals are noted to the circumflex. Attempted angioplasty and recommendations: In spite of aggressive attempted angioplasty, failed attempt. I will refer the patient to Dr. Peter Martinique to see whether she would be a candidate for attempted revascularization of the CTO, he has gracefully reviewed the angiograms and is willing to see the patient in the outpatient basis first. 85 mL contrast utilized. He will be discharged home with outpatient follow-up.  PCV ECHOCARDIOGRAM COMPLETE 37/85/8850 Normal LV systolic function with visual EF 55-60%. Left ventricle cavity is normal in size. Mild left ventricular hypertrophy. Normal global wall motion.  Indeterminate diastolic filling pattern, normal LAP. Mild (Grade I) mitral regurgitation. Mild tricuspid regurgitation. Compared to prior study dated 09/13/2017 no significant change.  [facility] MYOCARDIAL PERFUSION WO LEXISCAN 06/29/2020 Exercise nuclear stress test was performed using Bruce protocol. Patient reached 7.2 METS, and 86% of age predicted maximum heart rate. Exercise capacity was low. No chest pain reported. Heart rate and hemodynamic response were normal. Peak stress EKG showed sinus tachycardia, RBBB, nonspecific T wave changes inferior leads. SPECT images showed large sized, severe intensity, mid to basal, predominantly reversible perfusion defect in anterolateral, inferolateral myocardium. Intermediate risk study.  Carotid artery duplex  10/25/2019:  Diffuse homogeneous plaque in bilateral common carotid arteries with <50%  stenosis.  Bilateral external carotid stenosis <50%.  Antegrade bilateral vertebral flow.  Compared to 03/21/2018, left ICA stenosis of 50-69% not present.  Follow up studies when clinically indicated.  See enclosed images.  Echocardiogram 09/13/2017: Left ventricle cavity is normal in size. Mild concentric hypertrophy of the left ventricle. Normal global wall motion. Doppler evidence of grade I (impaired) diastolic dysfunction, normal LAP. Calculated EF 68%. Mild tricuspid regurgitation. Estimated pulmonary artery systolic pressure 24 mmHg.  Coronary angiogram 11/15/2016: Diffuse coronary calcification involving all the coronary arteries. Very short left main with mild calcification.  D1 ostial 60-70% stenosis.  Mid ramus intermediate 60% stenosis. Otherwise moderate scattered disease other vessels. Normal LVEF, EF estimated at 60%.  EKG  ***  05/03/2021: Sinus  bradycardia at a rate of 52 bpm.  Left axis, left anterior fascicular block.  Right bundle branch block.  No evidence of ischemia or underlying injury pattern.  Compared EKG 11/04/2020, no  significant change. ° °EKG 06/17/2020: Sinus rhythm at a rate of 61 bpm.  Normal axis.  Right bundle branch block. No evidence of ischemia. Compared to 09/20/2019, no significant change.  ° °EKG 09/20/2019: Normal sinus rhythm with rate of 57 bpm, normal axis, right bundle branch block.  No evidence of ischemia.  ° °EKG 03/30/2018: Normal sinus rhythm at rate of 63 bpm, left axis deviation, left anterior fascicular block.  Right bundle branch block.  No evidence of ischemia.  ° °Assessment  ° °No diagnosis found. °  °No orders of the defined types were placed in this encounter. °  °There are no discontinued medications. °  °Recommendations:  ° °Tajai E Cressman  is a  85 y.o. African-American male with history of hypertension, hyperlipidemia, diabetes mellitus, remote history of prostate cancer, obesity. Coronary angiography on 11/15/2016 had revealed no high-grade stenosis but moderate disease in multiple vessels.  Repeat coronary angiography on 08/18/2020 by Dr. Ganji revealed occlusion of the ostial LCx, attempted PCI was unsuccessful.  Patient therefore underwent repeat coronary angiography by Dr. Jordan 09/30/2020 revealing severe multivessel CAD, therefore PCI was not attempted.  Patient was then referred to cardiothoracic surgery for evaluation of CABG.  Patient underwent elective CABG x4 By Dr. Atkins on 10/12/2020. ° °Patient presents for 6 month follow up.  At last office visit patient's blood pressure was soft, therefore discontinued amlodipine. *** ° °*** ° °Patient presents for urgent visit with concerns of dizziness and a single episode of brief chest pain.  Given patient's chest pain symptoms are atypical at best and EKG is unchanged recommend watchful waiting.  Counseled patient regarding signs and symptoms that would warrant urgent or emergent evaluation.  Also refilled nitroglycerin prescription. S/L NTG was prescribed and explained how to and when to use it and to notify us if there is change in frequency  of use. Interaction with cialis-like agents was discussed.  ° °In regard to dizziness, patient's blood pressure is quite soft.  We will therefore discontinue amlodipine.  Patient will continue to monitor blood pressure on a regular basis at home and he will notify our office if he continues to have episodes of dizziness. ° °Will keep follow-up appointment in March 2023, unless he needs to be seen sooner. ° ° °Celeste C Cantwell, PA-C °09/16/2021, 9:13 AM °Office: [phone] °

## 2021-09-17 ENCOUNTER — Ambulatory Visit: Payer: Medicare Other | Admitting: Student

## 2021-09-23 NOTE — Progress Notes (Signed)
? ?Primary Physician/Referring:  Seward Carol, MD ? ?[name], male    DOB: 11/16/1935, 86 y.o.   MRN: [id number] ? ?Chief Complaint  ?Patient presents with  ? Coronary Artery Disease  ? Hypertension  ? Follow-up  ? ? ?HPI:   ? ?Mark Elliott  is a 86 y.o. African-American male with history of hypertension, hyperlipidemia, diabetes mellitus, remote history of prostate cancer, obesity. Coronary angiography on 11/15/2016 had revealed no high-grade stenosis but moderate disease in multiple vessels.  Repeat coronary angiography on 08/18/2020 by Dr. Einar Gip revealed occlusion of the ostial LCx, attempted PCI was unsuccessful.  Patient therefore underwent repeat coronary angiography by Dr. Martinique 09/30/2020 revealing severe multivessel CAD, therefore PCI was not attempted.  Patient was then referred to cardiothoracic surgery for evaluation of CABG.  Patient underwent elective CABG x4 By Dr. Orvan Seen on 10/12/2020. ? ?Patient presents for 6 month follow up.  At last office visit patient's blood pressure was soft, therefore discontinued amlodipine.  Patient is feeling quite well.  He is retired since last office visit and has had no recurrence of dizziness or fatigue.  He is exercising at the gym doing cardio and weightlifting 4 to 5 days/week without issue.  Patient has had no episodes of chest pain.  Patient is enrolled in remote patient monitoring and blood pressure is well controlled.  Denies dyspnea, dizziness, syncope, near syncope. ? ?Past Medical History:  ?Diagnosis Date  ? Cancer [facility])   ? Prostate  ? Coronary artery disease   ? Diabetes mellitus without complication ([place])   ? Dyspnea   ? with exterion  ? Hyperlipidemia   ? Hypertension   ? ?Past Surgical History:  ?Procedure Laterality Date  ? ANTERIOR FUSION CERVICAL SPINE    ? CORONARY ANGIOGRAPHY N/A 09/30/2020  ? Procedure: CORONARY ANGIOGRAPHY;  Surgeon: Martinique, Peter M, MD;  Location: [place] [facility];  Service: Cardiovascular;  Laterality:  N/A;  ? CORONARY ARTERY BYPASS GRAFT N/A 10/12/2020  ? Procedure: CORONARY ARTERY BYPASS GRAFTING (CABG) TIMES FOUR USING LEFT INTERNAL MAMMARY ARTERY AND RIGHT GREATER SAPHENOUS VEIN HARVESTED ENDOSCOPICALLY;  Surgeon: Wonda Olds, MD;  Location: [place];  Service: Open Heart Surgery;  Laterality: N/A;  ? HERNIA REPAIR    ? INTRAVASCULAR PRESSURE WIRE/FFR STUDY N/A 09/30/2020  ? Procedure: INTRAVASCULAR PRESSURE WIRE/FFR STUDY;  Surgeon: Martinique, Peter M, MD;  Location: [place] [facility];  Service: Cardiovascular;  Laterality: N/A;  ? LEFT HEART CATH AND CORONARY ANGIOGRAPHY N/A 11/15/2016  ? Procedure: Left Heart Cath and Coronary Angiography;  Surgeon: [name], MD;  Location: [place] [facility];  Service: Cardiovascular;  Laterality: N/A;  ? LEFT HEART CATH AND CORONARY ANGIOGRAPHY N/A 08/18/2020  ? Procedure: LEFT HEART CATH AND CORONARY ANGIOGRAPHY;  Surgeon: [name], MD;  Location: [place] [facility];  Service: Cardiovascular;  Laterality: N/A;  ? PROSTATE ABLATION    ? ROBOT ASSISTED LAPAROSCOPIC RADICAL PROSTATECTOMY  06/13/2007  ? TEE WITHOUT CARDIOVERSION N/A 10/12/2020  ? Procedure: TRANSESOPHAGEAL ECHOCARDIOGRAM (TEE);  Surgeon: Wonda Olds, MD;  Location: [place];  Service: Open Heart Surgery;  Laterality: N/A;  ? ?Family History  ?Problem Relation Age of Onset  ? Cancer Father   ?     liver  ? Cancer Brother   ?     pancreatic  ?  ?Social History  ? ?Tobacco Use  ? Smoking status: Former  ?  Years: 5.00  ?  Types: Cigarettes  ?  Quit  date: 02/13/1974  ?  Years since quitting: 47.6  ? Smokeless tobacco: Never  ?Substance Use Topics  ? Alcohol use: No  ?Marital Status: Married  ? ?ROS  ?Review of Systems  ?Cardiovascular:  Negative for chest pain, claudication, dyspnea on exertion, leg swelling, near-syncope, orthopnea, palpitations, paroxysmal nocturnal dyspnea and syncope.  ?Respiratory:  Negative for shortness of breath.   ?Neurological:  Negative for dizziness.  ?Objective  ?Blood pressure (!)  137/58, pulse (!) 47, temperature (!) 97.3 ?F (36.3 ?C), temperature source Temporal, resp. rate 16, height '5\' 6"'  (1.676 m), weight 223 lb 4.8 oz (101.3 kg), SpO2 99 %.  ?Vitals with BMI 09/24/2021 09/24/2021 05/03/2021  ?Height - '5\' 6"'  '5\' 6"'   ?Weight - 223 lbs 5 oz 222 lbs  ?BMI - 36.06 35.85  ?Systolic 366 440 347  ?Diastolic 58 59 48  ?Pulse 47 46 56  ? ? Physical Exam ?Vitals reviewed.  ?Cardiovascular:  ?   Rate and Rhythm: Regular rhythm. Bradycardia present.  ?   Pulses:     ?     Carotid pulses are 2+ on the right side and 2+ on the left side with bruit. ?     Radial pulses are 2+ on the right side and 2+ on the left side.  ?     Dorsalis pedis pulses are 1+ on the right side and 1+ on the left side.  ?     Posterior tibial pulses are 1+ on the right side and 1+ on the left side.  ?   Heart sounds: Normal heart sounds, S1 normal and S2 normal. No murmur heard. ?  No gallop.  ?   Comments:  No JVD. ?Pulmonary:  ?   Effort: Pulmonary effort is normal. No respiratory distress.  ?   Breath sounds: Normal breath sounds. No wheezing, rhonchi or rales.  ?Musculoskeletal:  ?   Right lower leg: No edema.  ?   Left lower leg: No edema.  ?Skin: ?   Comments: Sternotomy incision site and right calf graft sites well healed.   ?Neurological:  ?   Mental Status: He is alert.  ? ? ?Laboratory examination:  ? ?Recent Labs  ?  10/14/20 ?0429 10/15/20 ?0500 10/20/20 ?1400  ?NA 133* 135 133*  ?K 4.1 3.7 5.1  ?CL 103 102 101  ?CO2 '25 28 25  ' ?GLUCOSE 83 140* 269*  ?BUN '15 15 14  ' ?CREATININE 1.16 1.17 1.20  ?CALCIUM 8.4* 8.1* 8.8*  ?GFRNONAA >60 >60 >60  ? ?CrCl cannot be calculated (Patient's most recent lab result is older than the maximum 21 days allowed.).  ?CMP Latest Ref Rng & Units 10/20/2020 10/15/2020 10/14/2020  ?Glucose 70 - 99 mg/dL 269(H) 140(H) 83  ?BUN 8 - 23 mg/dL '14 15 15  ' ?Creatinine 0.61 - 1.24 mg/dL 1.20 1.17 1.16  ?Sodium 135 - 145 mmol/L 133(L) 135 133(L)  ?Potassium 3.5 - 5.1 mmol/L 5.1 3.7 4.1  ?Chloride 98 - 111  mmol/L 101 102 103  ?CO2 22 - 32 mmol/L '25 28 25  ' ?Calcium 8.9 - 10.3 mg/dL 8.8(L) 8.1(L) 8.4(L)  ?Total Protein 6.5 - 8.1 g/dL 5.8(L) - -  ?Total Bilirubin 0.3 - 1.2 mg/dL 1.0 - -  ?Alkaline Phos 38 - 126 U/L 48 - -  ?AST 15 - 41 U/L 16 - -  ?ALT 0 - 44 U/L 16 - -  ? ?CBC Latest Ref Rng & Units 10/20/2020 10/15/2020 10/14/2020  ?WBC 4.0 - 10.5 K/uL 7.7 6.3 6.3  ?  Hemoglobin 13.0 - 17.0 g/dL 10.0(L) 8.8(L) 9.0(L)  ?Hematocrit 39.0 - 52.0 % 30.8(L) 26.3(L) 27.1(L)  ?Platelets 150 - 400 K/uL 398 154 124(L)  ? ?Lipid Panel  ?   ?Component Value Date/Time  ? CHOL 101 10/16/2020 0232  ? TRIG 73 10/16/2020 0232  ? HDL 41 10/16/2020 0232  ? CHOLHDL 2.5 10/16/2020 0232  ? VLDL 15 10/16/2020 0232  ? Nowthen 45 10/16/2020 0232  ? ?HEMOGLOBIN A1C ?Lab Results  ?Component Value Date  ? HGBA1C 9.0 (H) 10/08/2020  ? MPG 211.6 10/08/2020  ? ?TSH ?No results for input(s): TSH in the last 8760 hours. ? ?External labs:  ?06/14/2021: ?A1c 8.4% ?Hgb 12.5, HCT 37.7, MCV 85.8, platelet 237 ?BUN 15, creatinine 1.27, GFR 56, sodium 139, potassium 4.5 ?Total cholesterol 108, HDL 55, triglycerides 69, LDL 38 ? ?05/28/2020: ?HDL 53, LDL 71, total cholesterol 138, triglycerides 64 ?A1c 8.0% ?BUN 19, creatinine 1.22, EGFR 60 ?TSH 1.93 ? ?08/19/2019: ?A1c 8.7%.  TSH normal. ?Serum glucose 55 mg, BUN 18, creatinine 1.0, EGFR >60 mL, potassium 4.0.  CMP otherwise normal. ?Total cholesterol 140, triglycerides 51, HDL 55, LDL 74.  Non-HDL cholesterol 85. ? ?03/08/2018: TSH 2.26.  Hemoglobin 13.0, CBC otherwise normal.  Hemoglobin A1c 8.5%.  Potassium 4.4, glucose 123, creatinine 1.09, EGFR 65/79, CMP otherwise normal.  Cholesterol 138, triglycerides 43, HDL 56, LDL 73. ? ?Allergies  ?No Known Allergies  ? ? ?Medications Prior to Visit:  ? ?Outpatient Medications Prior to Visit  ?Medication Sig Dispense Refill  ? ACCU-CHEK AVIVA PLUS test strip Inject 1 application as directed 2 (two) times daily.    ? aspirin EC 325 MG EC tablet Take 1 tablet (325 mg total)  by mouth daily.    ? ezetimibe (ZETIA) 10 MG tablet Take 1 tablet (10 mg total) by mouth daily. 30 tablet 11  ? furosemide (LASIX) 20 MG tablet Take 1 tablet (20 mg total) by mouth daily as needed for

## 2021-09-24 ENCOUNTER — Ambulatory Visit: Payer: Medicare Other | Admitting: Student

## 2021-09-24 ENCOUNTER — Other Ambulatory Visit: Payer: Self-pay

## 2021-09-24 ENCOUNTER — Encounter: Payer: Self-pay | Admitting: Student

## 2021-09-24 VITALS — BP 137/58 | HR 47 | Temp 97.3°F | Resp 16 | Ht 66.0 in | Wt 223.3 lb

## 2021-09-24 DIAGNOSIS — Z951 Presence of aortocoronary bypass graft: Secondary | ICD-10-CM

## 2021-09-24 DIAGNOSIS — E7800 Pure hypercholesterolemia, unspecified: Secondary | ICD-10-CM | POA: Diagnosis not present

## 2021-09-24 DIAGNOSIS — I25118 Atherosclerotic heart disease of native coronary artery with other forms of angina pectoris: Secondary | ICD-10-CM | POA: Diagnosis not present

## 2021-09-24 DIAGNOSIS — I10 Essential (primary) hypertension: Secondary | ICD-10-CM | POA: Diagnosis not present

## 2021-09-24 MED ORDER — ROSUVASTATIN CALCIUM 20 MG PO TABS: 20.0000 mg | ORAL_TABLET | Freq: Every day | ORAL | 3 refills | Status: DC

## 2021-10-04 DIAGNOSIS — E1169 Type 2 diabetes mellitus with other specified complication: Secondary | ICD-10-CM | POA: Diagnosis not present

## 2021-10-08 DIAGNOSIS — I10 Essential (primary) hypertension: Secondary | ICD-10-CM | POA: Diagnosis not present

## 2021-10-08 DIAGNOSIS — E1169 Type 2 diabetes mellitus with other specified complication: Secondary | ICD-10-CM | POA: Diagnosis not present

## 2021-10-20 ENCOUNTER — Other Ambulatory Visit: Payer: Self-pay

## 2021-10-20 MED ORDER — OLMESARTAN MEDOXOMIL 20 MG PO TABS: 10.0000 mg | ORAL_TABLET | Freq: Every day | ORAL | 0 refills | Status: DC

## 2021-11-08 DIAGNOSIS — Z03818 Encounter for observation for suspected exposure to other biological agents ruled out: Secondary | ICD-10-CM | POA: Diagnosis not present

## 2021-11-08 DIAGNOSIS — R059 Cough, unspecified: Secondary | ICD-10-CM | POA: Diagnosis not present

## 2021-11-14 DIAGNOSIS — I10 Essential (primary) hypertension: Secondary | ICD-10-CM | POA: Diagnosis not present

## 2021-12-14 DIAGNOSIS — I10 Essential (primary) hypertension: Secondary | ICD-10-CM | POA: Diagnosis not present

## 2021-12-14 DIAGNOSIS — E1169 Type 2 diabetes mellitus with other specified complication: Secondary | ICD-10-CM | POA: Diagnosis not present

## 2021-12-14 DIAGNOSIS — I25110 Atherosclerotic heart disease of native coronary artery with unstable angina pectoris: Secondary | ICD-10-CM | POA: Diagnosis not present

## 2021-12-14 DIAGNOSIS — Z794 Long term (current) use of insulin: Secondary | ICD-10-CM | POA: Diagnosis not present

## 2021-12-14 DIAGNOSIS — E7800 Pure hypercholesterolemia, unspecified: Secondary | ICD-10-CM | POA: Diagnosis not present

## 2021-12-15 DIAGNOSIS — I10 Essential (primary) hypertension: Secondary | ICD-10-CM | POA: Diagnosis not present

## 2022-01-14 DIAGNOSIS — I10 Essential (primary) hypertension: Secondary | ICD-10-CM | POA: Diagnosis not present

## 2022-01-24 ENCOUNTER — Other Ambulatory Visit: Payer: Self-pay | Admitting: Cardiology

## 2022-01-24 MED ORDER — OLMESARTAN MEDOXOMIL 20 MG PO TABS: 10.0000 mg | ORAL_TABLET | Freq: Every day | ORAL | 0 refills | Status: DC

## 2022-02-14 DIAGNOSIS — I10 Essential (primary) hypertension: Secondary | ICD-10-CM | POA: Diagnosis not present

## 2022-02-17 DIAGNOSIS — E7800 Pure hypercholesterolemia, unspecified: Secondary | ICD-10-CM | POA: Diagnosis not present

## 2022-02-17 DIAGNOSIS — E1169 Type 2 diabetes mellitus with other specified complication: Secondary | ICD-10-CM | POA: Diagnosis not present

## 2022-02-17 DIAGNOSIS — I10 Essential (primary) hypertension: Secondary | ICD-10-CM | POA: Diagnosis not present

## 2022-03-16 DIAGNOSIS — I10 Essential (primary) hypertension: Secondary | ICD-10-CM | POA: Diagnosis not present

## 2022-03-24 ENCOUNTER — Other Ambulatory Visit: Payer: Self-pay

## 2022-03-24 MED ORDER — EZETIMIBE 10 MG PO TABS: 10.0000 mg | ORAL_TABLET | Freq: Every day | ORAL | 11 refills | Status: DC

## 2022-03-28 ENCOUNTER — Ambulatory Visit: Payer: Medicare Other | Admitting: Student

## 2022-03-30 DIAGNOSIS — E7800 Pure hypercholesterolemia, unspecified: Secondary | ICD-10-CM | POA: Diagnosis not present

## 2022-03-30 DIAGNOSIS — I10 Essential (primary) hypertension: Secondary | ICD-10-CM | POA: Diagnosis not present

## 2022-03-30 DIAGNOSIS — E1169 Type 2 diabetes mellitus with other specified complication: Secondary | ICD-10-CM | POA: Diagnosis not present

## 2022-04-04 ENCOUNTER — Ambulatory Visit: Payer: Medicare Other | Admitting: Internal Medicine

## 2022-04-04 ENCOUNTER — Encounter: Payer: Self-pay | Admitting: Internal Medicine

## 2022-04-04 VITALS — BP 137/74 | HR 66 | Temp 97.2°F | Resp 16 | Ht 66.0 in | Wt 220.0 lb

## 2022-04-04 DIAGNOSIS — I10 Essential (primary) hypertension: Secondary | ICD-10-CM | POA: Diagnosis not present

## 2022-04-04 DIAGNOSIS — Z951 Presence of aortocoronary bypass graft: Secondary | ICD-10-CM | POA: Diagnosis not present

## 2022-04-04 DIAGNOSIS — E7800 Pure hypercholesterolemia, unspecified: Secondary | ICD-10-CM | POA: Diagnosis not present

## 2022-04-04 NOTE — Progress Notes (Signed)
Primary Physician/Referring:  Seward Carol, MD  [name], male    DOB: 07-29-1935, 86 y.o.   MRN: [id number]  No chief complaint on file.   HPI:    Mark Elliott  is a 86 y.o. African-American male with history of hypertension, hyperlipidemia, diabetes mellitus, remote history of prostate cancer, obesity. Coronary angiography on 11/15/2016 had revealed no high-grade stenosis but moderate disease in multiple vessels.  Repeat coronary angiography on 08/18/2020 by Dr. Einar Gip revealed occlusion of the ostial LCx, attempted PCI was unsuccessful.  Patient therefore underwent repeat coronary angiography by Dr. Martinique 09/30/2020 revealing severe multivessel CAD, therefore PCI was not attempted.  Patient was then referred to cardiothoracic surgery for evaluation of CABG.  Patient underwent elective CABG x4 By Dr. Orvan Seen on 10/12/2020.  Patient presents for 6 month follow up.   Patient has had no episodes of chest pain.  Patient is enrolled in remote patient monitoring and blood pressure is well controlled.  Denies dyspnea, dizziness, syncope, near syncope.  Past Medical History:  Diagnosis Date   Cancer [facility])    Prostate   Coronary artery disease    Diabetes mellitus without complication ([place])    Dyspnea    with exterion   Hyperlipidemia    Hypertension    Past Surgical History:  Procedure Laterality Date   ANTERIOR FUSION CERVICAL SPINE     CORONARY ANGIOGRAPHY N/A 09/30/2020   Procedure: CORONARY ANGIOGRAPHY;  Surgeon: Martinique, Peter M, MD;  Location: [place] [facility];  Service: Cardiovascular;  Laterality: N/A;   CORONARY ARTERY BYPASS GRAFT N/A 10/12/2020   Procedure: CORONARY ARTERY BYPASS GRAFTING (CABG) TIMES FOUR USING LEFT INTERNAL MAMMARY ARTERY AND RIGHT GREATER SAPHENOUS VEIN HARVESTED ENDOSCOPICALLY;  Surgeon: Wonda Olds, MD;  Location: [place];  Service: Open Heart Surgery;  Laterality: N/A;   HERNIA REPAIR     INTRAVASCULAR PRESSURE WIRE/FFR STUDY N/A  09/30/2020   Procedure: INTRAVASCULAR PRESSURE WIRE/FFR STUDY;  Surgeon: Martinique, Peter M, MD;  Location: [place] [facility];  Service: Cardiovascular;  Laterality: N/A;   LEFT HEART CATH AND CORONARY ANGIOGRAPHY N/A 11/15/2016   Procedure: Left Heart Cath and Coronary Angiography;  Surgeon: [name], MD;  Location: [place] [facility];  Service: Cardiovascular;  Laterality: N/A;   LEFT HEART CATH AND CORONARY ANGIOGRAPHY N/A 08/18/2020   Procedure: LEFT HEART CATH AND CORONARY ANGIOGRAPHY;  Surgeon: [name], MD;  Location: [place] [facility];  Service: Cardiovascular;  Laterality: N/A;   PROSTATE ABLATION     ROBOT ASSISTED LAPAROSCOPIC RADICAL PROSTATECTOMY  06/13/2007   TEE WITHOUT CARDIOVERSION N/A 10/12/2020   Procedure: TRANSESOPHAGEAL ECHOCARDIOGRAM (TEE);  Surgeon: Wonda Olds, MD;  Location: [place];  Service: Open Heart Surgery;  Laterality: N/A;   Family History  Problem Relation Age of Onset   Cancer Father        liver   Cancer Brother        pancreatic    Social History   Tobacco Use   Smoking status: Former    Years: 5.00    Types: Cigarettes    Quit date: 02/13/1974    Years since quitting: 48.1   Smokeless tobacco: Never  Substance Use Topics   Alcohol use: No  Marital Status: Married   ROS  Review of Systems  Cardiovascular:  Negative for chest pain, claudication, dyspnea on exertion, leg swelling, near-syncope, orthopnea, palpitations, paroxysmal nocturnal dyspnea and syncope.  Respiratory:  Negative for shortness of breath.  Neurological:  Negative for dizziness.   Objective  Blood pressure 137/74, pulse 66, temperature (!) 97.2 F (36.2 C), temperature source Temporal, resp. rate 16, height '5\' 6"'  (1.676 m), weight 220 lb (99.8 kg), SpO2 100 %.     04/04/2022    1:31 PM 09/24/2021   10:00 AM 09/24/2021    9:53 AM  Vitals with BMI  Height '5\' 6"'   '5\' 6"'   Weight 220 lbs  223 lbs 5 oz  BMI 20.94  70.96  Systolic 283 662 947  Diastolic 74 58 59  Pulse 66  47 46    Physical Exam Vitals reviewed.  Cardiovascular:     Rate and Rhythm: Regular rhythm. Bradycardia present.     Pulses:          Carotid pulses are 2+ on the right side and 2+ on the left side with bruit.      Radial pulses are 2+ on the right side and 2+ on the left side.       Dorsalis pedis pulses are 1+ on the right side and 1+ on the left side.       Posterior tibial pulses are 1+ on the right side and 1+ on the left side.     Heart sounds: Normal heart sounds, S1 normal and S2 normal. No murmur heard.    No gallop.     Comments:  No JVD. Pulmonary:     Effort: Pulmonary effort is normal. No respiratory distress.     Breath sounds: Normal breath sounds. No wheezing, rhonchi or rales.  Musculoskeletal:     Right lower leg: No edema.     Left lower leg: No edema.  Neurological:     Mental Status: He is alert.     Laboratory examination:   No results for input(s): "NA", "K", "CL", "CO2", "GLUCOSE", "BUN", "CREATININE", "CALCIUM", "GFRNONAA", "GFRAA" in the last 8760 hours.  CrCl cannot be calculated (Patient's most recent lab result is older than the maximum 21 days allowed.).     Latest Ref Rng & Units 10/20/2020    2:00 PM 10/15/2020    5:00 AM 10/14/2020    4:29 AM  CMP  Glucose 70 - 99 mg/dL 269  140  83   BUN 8 - 23 mg/dL '14  15  15   ' Creatinine 0.61 - 1.24 mg/dL 1.20  1.17  1.16   Sodium 135 - 145 mmol/L 133  135  133   Potassium 3.5 - 5.1 mmol/L 5.1  3.7  4.1   Chloride 98 - 111 mmol/L 101  102  103   CO2 22 - 32 mmol/L '25  28  25   ' Calcium 8.9 - 10.3 mg/dL 8.8  8.1  8.4   Total Protein 6.5 - 8.1 g/dL 5.8     Total Bilirubin 0.3 - 1.2 mg/dL 1.0     Alkaline Phos 38 - 126 U/L 48     AST 15 - 41 U/L 16     ALT 0 - 44 U/L 16         Latest Ref Rng & Units 10/20/2020    2:00 PM 10/15/2020    5:00 AM 10/14/2020    4:29 AM  CBC  WBC 4.0 - 10.5 K/uL 7.7  6.3  6.3   Hemoglobin 13.0 - 17.0 g/dL 10.0  8.8  9.0   Hematocrit 39.0 - 52.0 % 30.8  26.3  27.1    Platelets 150 - 400 K/uL 398  154  124    Lipid Panel     Component Value Date/Time   CHOL 101 10/16/2020 0232   TRIG 73 10/16/2020 0232   HDL 41 10/16/2020 0232   CHOLHDL 2.5 10/16/2020 0232   VLDL 15 10/16/2020 0232   LDLCALC 45 10/16/2020 0232   HEMOGLOBIN A1C Lab Results  Component Value Date   HGBA1C 9.0 (H) 10/08/2020   MPG 211.6 10/08/2020   TSH No results for input(s): "TSH" in the last 8760 hours.  External labs:  06/14/2021: A1c 8.4% Hgb 12.5, HCT 37.7, MCV 85.8, platelet 237 BUN 15, creatinine 1.27, GFR 56, sodium 139, potassium 4.5 Total cholesterol 108, HDL 55, triglycerides 69, LDL 38  05/28/2020: HDL 53, LDL 71, total cholesterol 138, triglycerides 64 A1c 8.0% BUN 19, creatinine 1.22, EGFR 60 TSH 1.93  08/19/2019: A1c 8.7%.  TSH normal. Serum glucose 55 mg, BUN 18, creatinine 1.0, EGFR >60 mL, potassium 4.0.  CMP otherwise normal. Total cholesterol 140, triglycerides 51, HDL 55, LDL 74.  Non-HDL cholesterol 85.  03/08/2018: TSH 2.26.  Hemoglobin 13.0, CBC otherwise normal.  Hemoglobin A1c 8.5%.  Potassium 4.4, glucose 123, creatinine 1.09, EGFR 65/79, CMP otherwise normal.  Cholesterol 138, triglycerides 43, HDL 56, LDL 73.  Allergies  No Known Allergies    Medications Prior to Visit:   Outpatient Medications Prior to Visit  Medication Sig Dispense Refill   ACCU-CHEK AVIVA PLUS test strip Inject 1 application as directed 2 (two) times daily.     aspirin EC 325 MG EC tablet Take 1 tablet (325 mg total) by mouth daily.     ezetimibe (ZETIA) 10 MG tablet Take 1 tablet (10 mg total) by mouth daily. 30 tablet 11   LANTUS SOLOSTAR 100 UNIT/ML Solostar Pen Inject 16 Units into the skin daily.     metFORMIN (GLUCOPHAGE-XR) 500 MG 24 hr tablet Take 2 tablets (1,000 mg total) by mouth 2 (two) times daily.     metoprolol tartrate (LOPRESSOR) 25 MG tablet Take 1 tablet (25 mg total) by mouth 2 (two) times daily. (Patient taking differently: Take 12.5 mg by  mouth 2 (two) times daily.) 60 tablet 2   olmesartan (BENICAR) 20 MG tablet Take 0.5 tablets (10 mg total) by mouth daily. 45 tablet 0   rosuvastatin (CRESTOR) 20 MG tablet Take 1 tablet (20 mg total) by mouth daily. 90 tablet 3   furosemide (LASIX) 20 MG tablet Take 1 tablet (20 mg total) by mouth daily as needed for fluid or edema. 30 tablet 3   hydrALAZINE (APRESOLINE) 25 MG tablet Take 1 tablet (25 mg total) by mouth 3 (three) times daily as needed (For blood pressure >150/90 mmHg). 60 tablet 3   nitroGLYCERIN (NITROSTAT) 0.4 MG SL tablet Place 1 tablet (0.4 mg total) under the tongue every 5 (five) minutes as needed for chest pain. 30 tablet 3   TRULICITY 1.61 WR/6.0AV SOPN Inject into the skin.     No facility-administered medications prior to visit.   Final Medications at End of Visit    Current Meds  Medication Sig   ACCU-CHEK AVIVA PLUS test strip Inject 1 application as directed 2 (two) times daily.   aspirin EC 325 MG EC tablet Take 1 tablet (325 mg total) by mouth daily.   ezetimibe (ZETIA) 10 MG tablet Take 1 tablet (10 mg total) by mouth daily.   LANTUS SOLOSTAR 100 UNIT/ML Solostar Pen Inject 16 Units into the skin daily.   metFORMIN (GLUCOPHAGE-XR) 500 MG 24 hr tablet Take 2 tablets (  1,000 mg total) by mouth 2 (two) times daily.   metoprolol tartrate (LOPRESSOR) 25 MG tablet Take 1 tablet (25 mg total) by mouth 2 (two) times daily. (Patient taking differently: Take 12.5 mg by mouth 2 (two) times daily.)   olmesartan (BENICAR) 20 MG tablet Take 0.5 tablets (10 mg total) by mouth daily.   rosuvastatin (CRESTOR) 20 MG tablet Take 1 tablet (20 mg total) by mouth daily.   Radiology:   No results found.  Cardiac Studies:   Coronary Artery Bypass Grafting x 4 10/12/2020: Left Internal Mammary Artery to Distal Left Anterior Descending Coronary Artery;  Saphenous Vein Graft to  Posterior Descending Coronary Artery;  Saphenous Vein Graft to 1st Obtuse Marginal Branch of Left  Circumflex Coronary Artery;  Sapheonous Vein Graft to 2nd Obtuse Marginal Branch of Left Circumflex Coronary Artery;  Endoscopic Vein Harvest from right thigh and Lower Leg  Coronary Angiography 09/30/2020:  Ost LM to Mid LM lesion is 40% stenosed. Ost LAD to Prox LAD lesion is 75% stenosed. Ost Cx to Prox Cx lesion is 100% stenosed. 1. Severe multivessel CAD with CTO of the ostial LCx and significant proximal LAD disease by iFR of 0.71 Plan: consider CABG  Left Heart Catheterization 08/18/20: LV: Normal LV systolic function. Normal EDP. No pressure gradient across the aortic valve. Left main: Very short. Mildly calcified. LAD: Large caliber vessel. Has moderate diffuse coronary calcification. Proximal segment has a 30 to 35% stenosis and mid to distal segment is diffusely diseased and focal 30% stenosis in the distal segment. No significant change from prior angiography 2018. Circumflex: Very large caliber vessel and a large vessel. Heavily calcified and flush occluded at the ostium. Faint TIMI I flow is evident distally. Collaterals are evident from the RCA to the circumflex. RCA: Dominant. Mild to moderate disease in the proximal segment with calcification. Tortuous. Distal right has calcific 60% stenosis, again not significantly changed from prior angiography in 2018. Collaterals are noted to the circumflex. Attempted angioplasty and recommendations: In spite of aggressive attempted angioplasty, failed attempt. I will refer the patient to Dr. Peter Martinique to see whether she would be a candidate for attempted revascularization of the CTO, he has gracefully reviewed the angiograms and is willing to see the patient in the outpatient basis first. 85 mL contrast utilized. He will be discharged home with outpatient follow-up.  PCV ECHOCARDIOGRAM COMPLETE 57/26/2035 Normal LV systolic function with visual EF 55-60%. Left ventricle cavity is normal in size. Mild left ventricular hypertrophy. Normal  global wall motion. Indeterminate diastolic filling pattern, normal LAP. Mild (Grade I) mitral regurgitation. Mild tricuspid regurgitation. Compared to prior study dated 09/13/2017 no significant change.  PCV MYOCARDIAL PERFUSION WO LEXISCAN 06/29/2020 Exercise nuclear stress test was performed using Bruce protocol. Patient reached 7.2 METS, and 86% of age predicted maximum heart rate. Exercise capacity was low. No chest pain reported. Heart rate and hemodynamic response were normal. Peak stress EKG showed sinus tachycardia, RBBB, nonspecific T wave changes inferior leads. SPECT images showed large sized, severe intensity, mid to basal, predominantly reversible perfusion defect in anterolateral, inferolateral myocardium. Intermediate risk study.  Carotid artery duplex  10/25/2019:  Diffuse homogeneous plaque in bilateral common carotid arteries with <50%  stenosis.  Bilateral external carotid stenosis <50%.  Antegrade bilateral vertebral flow.  Compared to 03/21/2018, left ICA stenosis of 50-69% not present.  Follow up studies when clinically indicated.  See enclosed images.  EKG    04/04/22: NSR with RBBB, no change compared to prior  09/24/2021: Sinus bradycardia rate 47 bpm.  Left axis, left anterior fascicular block.  Right bundle branch block.  No evidence of ischemia or underlying injury pattern.  Compared EKG 04/29/2021, no significant change.  Assessment     ICD-10-CM   1. Primary hypertension  I10 EKG 12-Lead    2. Hx of CABG  Z95.1     3. Hypercholesteremia  E78.00       No orders of the defined types were placed in this encounter.   There are no discontinued medications.   Recommendations:   Mark Elliott  is a  86 y.o. African-American male with history of hypertension, hyperlipidemia, diabetes mellitus, remote history of prostate cancer, obesity. Coronary angiography on 11/15/2016 had revealed no high-grade stenosis but moderate disease in multiple vessels.   Repeat coronary angiography on 08/18/2020 by Dr. Einar Gip revealed occlusion of the ostial LCx, attempted PCI was unsuccessful.  Patient therefore underwent repeat coronary angiography by Dr. Martinique 09/30/2020 revealing severe multivessel CAD, therefore PCI was not attempted.  Patient was then referred to cardiothoracic surgery for evaluation of CABG.  Patient underwent elective CABG x4 By Dr. Orvan Seen on 10/12/2020.  Patient presents for 6 month follow up.   BP well controlled No symptoms of dizziness or light-headedness Just started Trulicity as [id number] is not at goal  In regard to hyperlipidemia, LDL remains >70.  We will switch him from pravastatin to rosuvastatin 20 mg nightly.  We will repeat lipid profile testing in 8 weeks.  Patient is otherwise stable from a cardiovascular standpoint.  Follow-up in 6 months, sooner if needed.   Floydene Flock, DO, [facility] 04/04/2022, 1:58 PM Office: [phone]

## 2022-04-15 ENCOUNTER — Emergency Department (HOSPITAL_COMMUNITY): Payer: Medicare Other

## 2022-04-15 ENCOUNTER — Encounter (HOSPITAL_COMMUNITY): Payer: Self-pay | Admitting: Emergency Medicine

## 2022-04-15 ENCOUNTER — Other Ambulatory Visit: Payer: Self-pay

## 2022-04-15 ENCOUNTER — Emergency Department (HOSPITAL_COMMUNITY)
Admission: EM | Admit: 2022-04-15 | Discharge: 2022-04-15 | Discharge status: Against Medical Advice | Payer: Medicare Other | Attending: Emergency Medicine | Admitting: Emergency Medicine

## 2022-04-15 DIAGNOSIS — Z951 Presence of aortocoronary bypass graft: Secondary | ICD-10-CM | POA: Diagnosis not present

## 2022-04-15 DIAGNOSIS — R001 Bradycardia, unspecified: Secondary | ICD-10-CM | POA: Insufficient documentation

## 2022-04-15 DIAGNOSIS — Z5321 Procedure and treatment not carried out due to patient leaving prior to being seen by health care provider: Secondary | ICD-10-CM | POA: Diagnosis not present

## 2022-04-15 DIAGNOSIS — R079 Chest pain, unspecified: Secondary | ICD-10-CM | POA: Insufficient documentation

## 2022-04-15 DIAGNOSIS — I4510 Unspecified right bundle-branch block: Secondary | ICD-10-CM | POA: Insufficient documentation

## 2022-04-15 DIAGNOSIS — M5416 Radiculopathy, lumbar region: Secondary | ICD-10-CM | POA: Diagnosis not present

## 2022-04-15 LAB — BASIC METABOLIC PANEL
Anion gap: 8 (ref 5–15)
BUN: 14 mg/dL (ref 8–23)
CO2: 26 mmol/L (ref 22–32)
Calcium: 9.4 mg/dL (ref 8.9–10.3)
Chloride: 103 mmol/L (ref 98–111)
Creatinine, Ser: 1.11 mg/dL (ref 0.61–1.24)
GFR, Estimated: >60 mL/min (ref >60)
Glucose, Bld: 89 mg/dL (ref 70–99)
Potassium: 4 mmol/L (ref 3.5–5.1)
Sodium: 137 mmol/L (ref 135–145)

## 2022-04-15 LAB — CBC WITH DIFFERENTIAL/PLATELET
Abs Immature Granulocytes: 0.01 10*3/uL (ref 0.00–0.07)
Basophils Absolute: 0 10*3/uL (ref 0.0–0.1)
Basophils Relative: 1 %
Eosinophils Absolute: 0.1 10*3/uL (ref 0.0–0.5)
Eosinophils Relative: 2 %
HCT: 38.3 % — ABNORMAL LOW (ref 39.0–52.0)
Hemoglobin: 12.4 g/dL — ABNORMAL LOW (ref 13.0–17.0)
Immature Granulocytes: 0 %
Lymphocytes Relative: 39 %
Lymphs Abs: 1.9 10*3/uL (ref 0.7–4.0)
MCH: 28.5 pg (ref 26.0–34.0)
MCHC: 32.4 g/dL (ref 30.0–36.0)
MCV: 88 fL (ref 80.0–100.0)
Monocytes Absolute: 0.5 10*3/uL (ref 0.1–1.0)
Monocytes Relative: 9 %
Neutro Abs: 2.4 10*3/uL (ref 1.7–7.7)
Neutrophils Relative %: 49 %
Platelets: 237 10*3/uL (ref 150–400)
RBC: 4.35 MIL/uL (ref 4.22–5.81)
RDW: 13.7 % (ref 11.5–15.5)
WBC: 4.9 10*3/uL (ref 4.0–10.5)
nRBC: 0 % (ref 0.0–0.2)

## 2022-04-15 LAB — BRAIN NATRIURETIC PEPTIDE: B Natriuretic Peptide: 121.7 pg/mL — ABNORMAL HIGH (ref 0.0–100.0)

## 2022-04-15 LAB — TROPONIN I (HIGH SENSITIVITY): Troponin I (High Sensitivity): 14 ng/L (ref <18)

## 2022-04-15 NOTE — ED Notes (Signed)
Pt stated that the wait is too long and Pt seen leaving the ED.

## 2022-04-15 NOTE — ED Provider Triage Note (Addendum)
Emergency Medicine Provider Triage Evaluation Note  Mark Elliott , a 86 y.o. male  was evaluated in triage.  Pt complains of chest pain. Woke him from sleep around 1am.  Pain left sided without radiation.  No SOB.  Hx of CABG.  Followed by Dr. Einar Gip.  Took 2 NTG PTA, transient relief of pain but coming back.  Review of Systems  Positive: Chest pain Negative: Fever, cough  Physical Exam  BP (!) 159/68 (BP Location: Right Arm)   Pulse (!) 56   Temp 98.3 F (36.8 C) (Oral)   Resp 18   SpO2 99%  Gen:   Awake, no distress   Resp:  Normal effort  MSK:   Moves extremities without difficulty  Other:    Medical Decision Making  Medically screening exam initiated at 3:01 AM.  Appropriate orders placed.  KEVORK JOYCE was informed that the remainder of the evaluation will be completed by another provider, this initial triage assessment does not replace that evaluation, and the importance of remaining in the ED until their evaluation is complete.  Chest pain.  Prior CABG.  Some relief with NTG but returning.  EKG, labs, CXR.   Larene Pickett, PA-C 04/15/22 0302    Larene Pickett, PA-C 04/15/22 [phone]

## 2022-04-15 NOTE — ED Triage Notes (Signed)
Pt was woken up at 1:30am w/ left sided chest pain described as "someone hitting him from the inside and then it levels off."  Pt denies n/v/fever/sob.  Extensive cardiac hx.

## 2022-04-18 ENCOUNTER — Other Ambulatory Visit: Payer: Self-pay | Admitting: Orthopedic Surgery

## 2022-04-18 DIAGNOSIS — M545 Low back pain, unspecified: Secondary | ICD-10-CM

## 2022-04-19 ENCOUNTER — Ambulatory Visit
Admission: RE | Admit: 2022-04-19 | Discharge: 2022-04-19 | Disposition: A | Discharge status: Home / Self Care | Payer: Medicare Other | Source: Ambulatory Visit | Attending: Orthopedic Surgery | Admitting: Orthopedic Surgery

## 2022-04-19 DIAGNOSIS — M545 Low back pain, unspecified: Secondary | ICD-10-CM | POA: Diagnosis not present

## 2022-04-19 DIAGNOSIS — M48061 Spinal stenosis, lumbar region without neurogenic claudication: Secondary | ICD-10-CM | POA: Diagnosis not present

## 2022-04-20 ENCOUNTER — Telehealth: Payer: Self-pay

## 2022-04-20 NOTE — Telephone Encounter (Signed)
     Patient  visit on 10/6  at [place] Have you been able to follow up with your primary care physician? yes  The patient was or was not able to obtain any needed medicine or equipment.yes  Are there diet recommendations that you are having difficulty following? na  Patient expresses understanding of discharge instructions and education provided has no other needs at this time. yes    Mark Elliott [facility] Guide, [place], Care Management  [phone] [address], [place], [place] [address] Phone: [phone] Email: [email or web address]    

## 2022-04-22 ENCOUNTER — Ambulatory Visit: Payer: Medicare Other

## 2022-04-22 VITALS — BP 152/70 | HR 51

## 2022-04-22 DIAGNOSIS — I10 Essential (primary) hypertension: Secondary | ICD-10-CM | POA: Diagnosis not present

## 2022-04-22 MED ORDER — HYDRALAZINE HCL 50 MG PO TABS: 25.0000 mg | ORAL_TABLET | Freq: Three times a day (TID) | ORAL | 3 refills | Status: DC

## 2022-04-22 NOTE — Progress Notes (Addendum)
Today's Vitals   04/22/22 1002 04/22/22 1003  BP: (!) 173/67 (!) 152/70  Pulse: (!) 51    There is no height or weight on file to calculate BMI.  Primary hypertension Presented today for nurse visit for blood pressure check.  Elevated at home according to patient readings 163/123.  Patient blood pressure in office was elevated with, repeat manual blood pressure 152/70.  He denies chest pain shortness of breath, dizziness, lightheadedness. Will increase hydralazine to 50 mg 3 times daily.  Continue to monitor blood pressure at home and keep log of blood pressure. Follow-up next office visit with Dr. Shellia Carwin.

## 2022-04-25 DIAGNOSIS — M5451 Vertebrogenic low back pain: Secondary | ICD-10-CM | POA: Diagnosis not present

## 2022-05-04 DIAGNOSIS — I10 Essential (primary) hypertension: Secondary | ICD-10-CM | POA: Diagnosis not present

## 2022-05-04 DIAGNOSIS — J069 Acute upper respiratory infection, unspecified: Secondary | ICD-10-CM | POA: Diagnosis not present

## 2022-05-04 DIAGNOSIS — E1169 Type 2 diabetes mellitus with other specified complication: Secondary | ICD-10-CM | POA: Diagnosis not present

## 2022-05-04 DIAGNOSIS — E7800 Pure hypercholesterolemia, unspecified: Secondary | ICD-10-CM | POA: Diagnosis not present

## 2022-05-09 DIAGNOSIS — R739 Hyperglycemia, unspecified: Secondary | ICD-10-CM | POA: Diagnosis not present

## 2022-05-09 DIAGNOSIS — M5416 Radiculopathy, lumbar region: Secondary | ICD-10-CM | POA: Diagnosis not present

## 2022-05-09 DIAGNOSIS — M7911 Myalgia of mastication muscle: Secondary | ICD-10-CM | POA: Diagnosis not present

## 2022-05-09 DIAGNOSIS — I4510 Unspecified right bundle-branch block: Secondary | ICD-10-CM | POA: Diagnosis not present

## 2022-05-09 DIAGNOSIS — R531 Weakness: Secondary | ICD-10-CM | POA: Diagnosis not present

## 2022-05-09 DIAGNOSIS — R55 Syncope and collapse: Secondary | ICD-10-CM | POA: Diagnosis not present

## 2022-05-09 DIAGNOSIS — M549 Dorsalgia, unspecified: Secondary | ICD-10-CM | POA: Diagnosis not present

## 2022-05-12 DIAGNOSIS — M5416 Radiculopathy, lumbar region: Secondary | ICD-10-CM | POA: Diagnosis not present

## 2022-05-16 ENCOUNTER — Other Ambulatory Visit: Payer: Self-pay

## 2022-05-16 DIAGNOSIS — I10 Essential (primary) hypertension: Secondary | ICD-10-CM | POA: Diagnosis not present

## 2022-05-16 MED ORDER — OLMESARTAN MEDOXOMIL 20 MG PO TABS: 10.0000 mg | ORAL_TABLET | Freq: Every day | ORAL | 0 refills | Status: DC

## 2022-05-18 ENCOUNTER — Telehealth: Payer: Self-pay

## 2022-05-18 NOTE — Telephone Encounter (Signed)
error 

## 2022-05-30 DIAGNOSIS — M5416 Radiculopathy, lumbar region: Secondary | ICD-10-CM | POA: Diagnosis not present

## 2022-06-01 DIAGNOSIS — E7800 Pure hypercholesterolemia, unspecified: Secondary | ICD-10-CM | POA: Diagnosis not present

## 2022-06-01 DIAGNOSIS — I10 Essential (primary) hypertension: Secondary | ICD-10-CM | POA: Diagnosis not present

## 2022-06-01 DIAGNOSIS — E1169 Type 2 diabetes mellitus with other specified complication: Secondary | ICD-10-CM | POA: Diagnosis not present

## 2022-06-01 DIAGNOSIS — I25110 Atherosclerotic heart disease of native coronary artery with unstable angina pectoris: Secondary | ICD-10-CM | POA: Diagnosis not present

## 2022-06-09 DIAGNOSIS — Z23 Encounter for immunization: Secondary | ICD-10-CM | POA: Diagnosis not present

## 2022-06-10 DIAGNOSIS — M545 Low back pain, unspecified: Secondary | ICD-10-CM | POA: Diagnosis not present

## 2022-06-15 DIAGNOSIS — I10 Essential (primary) hypertension: Secondary | ICD-10-CM | POA: Diagnosis not present

## 2022-06-20 DIAGNOSIS — Z23 Encounter for immunization: Secondary | ICD-10-CM | POA: Diagnosis not present

## 2022-06-20 DIAGNOSIS — I25110 Atherosclerotic heart disease of native coronary artery with unstable angina pectoris: Secondary | ICD-10-CM | POA: Diagnosis not present

## 2022-06-20 DIAGNOSIS — Z0000 Encounter for general adult medical examination without abnormal findings: Secondary | ICD-10-CM | POA: Diagnosis not present

## 2022-06-20 DIAGNOSIS — E7800 Pure hypercholesterolemia, unspecified: Secondary | ICD-10-CM | POA: Diagnosis not present

## 2022-06-20 DIAGNOSIS — M48061 Spinal stenosis, lumbar region without neurogenic claudication: Secondary | ICD-10-CM | POA: Diagnosis not present

## 2022-06-20 DIAGNOSIS — I10 Essential (primary) hypertension: Secondary | ICD-10-CM | POA: Diagnosis not present

## 2022-06-20 DIAGNOSIS — E1169 Type 2 diabetes mellitus with other specified complication: Secondary | ICD-10-CM | POA: Diagnosis not present

## 2022-06-20 DIAGNOSIS — Z794 Long term (current) use of insulin: Secondary | ICD-10-CM | POA: Diagnosis not present

## 2022-07-12 DIAGNOSIS — M48061 Spinal stenosis, lumbar region without neurogenic claudication: Secondary | ICD-10-CM | POA: Diagnosis not present

## 2022-07-16 DIAGNOSIS — I10 Essential (primary) hypertension: Secondary | ICD-10-CM | POA: Diagnosis not present

## 2022-07-19 DIAGNOSIS — M48061 Spinal stenosis, lumbar region without neurogenic claudication: Secondary | ICD-10-CM | POA: Diagnosis not present

## 2022-07-22 DIAGNOSIS — M48061 Spinal stenosis, lumbar region without neurogenic claudication: Secondary | ICD-10-CM | POA: Diagnosis not present

## 2022-07-26 DIAGNOSIS — M48061 Spinal stenosis, lumbar region without neurogenic claudication: Secondary | ICD-10-CM | POA: Diagnosis not present

## 2022-07-28 DIAGNOSIS — M48061 Spinal stenosis, lumbar region without neurogenic claudication: Secondary | ICD-10-CM | POA: Diagnosis not present

## 2022-08-02 DIAGNOSIS — M48061 Spinal stenosis, lumbar region without neurogenic claudication: Secondary | ICD-10-CM | POA: Diagnosis not present

## 2022-08-04 DIAGNOSIS — M48061 Spinal stenosis, lumbar region without neurogenic claudication: Secondary | ICD-10-CM | POA: Diagnosis not present

## 2022-08-09 DIAGNOSIS — M48061 Spinal stenosis, lumbar region without neurogenic claudication: Secondary | ICD-10-CM | POA: Diagnosis not present

## 2022-08-11 DIAGNOSIS — M48061 Spinal stenosis, lumbar region without neurogenic claudication: Secondary | ICD-10-CM | POA: Diagnosis not present

## 2022-08-12 DIAGNOSIS — E119 Type 2 diabetes mellitus without complications: Secondary | ICD-10-CM | POA: Diagnosis not present

## 2022-08-16 DIAGNOSIS — I10 Essential (primary) hypertension: Secondary | ICD-10-CM | POA: Diagnosis not present

## 2022-08-18 ENCOUNTER — Other Ambulatory Visit: Payer: Self-pay

## 2022-08-18 MED ORDER — OLMESARTAN MEDOXOMIL 20 MG PO TABS: 10.0000 mg | ORAL_TABLET | Freq: Every day | ORAL | 1 refills | Status: DC

## 2022-08-24 ENCOUNTER — Other Ambulatory Visit: Payer: Self-pay

## 2022-08-24 DIAGNOSIS — I5032 Chronic diastolic (congestive) heart failure: Secondary | ICD-10-CM

## 2022-08-24 DIAGNOSIS — E119 Type 2 diabetes mellitus without complications: Secondary | ICD-10-CM

## 2022-08-24 MED ORDER — DAPAGLIFLOZIN PROPANEDIOL 10 MG PO TABS: 10.0000 mg | ORAL_TABLET | Freq: Every day | ORAL | 2 refills | Status: DC

## 2022-08-24 NOTE — Progress Notes (Signed)
Per Dr. Einar Gip - add Farxiga 63m daily for patient HFpEF and diabetes.  Systolic Blood Pressure [id number] Diastolic Blood Pressure [id number](58.0 - 102.0) Heart Rate 66.3 (54.0 - 87.0)  08/23/22 9:42 AM Transtek TMB-... 136 / 78 mmHg 60 bpm  08/23/22 9:40 AM Transtek TMB-... 141 / 72 mmHg 62 bpm  08/22/22 4:35 PM Transtek TMB-... 143 / 73 mmHg 55 bpm  08/20/22 11:05 AM Transtek TMB-... 151 / 68 mmHg 57 bpm  08/19/22 8:30 PM Transtek TMB-... 140 / 63 mmHg 69 bpm  08/18/22 2:03 PM Transtek TMB-... 140 / 68 mmHg 61 bpm  08/17/22 6:47 PM Transtek TMB-... 127 / 59 mmHg 61 bpm  08/16/22 10:12 AM Transtek TMB-... 125 / 66 mmHg 55 bpm

## 2022-09-01 DIAGNOSIS — M5416 Radiculopathy, lumbar region: Secondary | ICD-10-CM | POA: Diagnosis not present

## 2022-09-07 ENCOUNTER — Other Ambulatory Visit: Payer: Self-pay

## 2022-09-07 DIAGNOSIS — I5032 Chronic diastolic (congestive) heart failure: Secondary | ICD-10-CM

## 2022-09-07 NOTE — Addendum Note (Signed)
Addended by: Haynes Dage on: [id number] PM   Modules accepted: Orders

## 2022-09-08 DIAGNOSIS — E7800 Pure hypercholesterolemia, unspecified: Secondary | ICD-10-CM | POA: Diagnosis not present

## 2022-09-08 DIAGNOSIS — E1169 Type 2 diabetes mellitus with other specified complication: Secondary | ICD-10-CM | POA: Diagnosis not present

## 2022-09-08 DIAGNOSIS — I10 Essential (primary) hypertension: Secondary | ICD-10-CM | POA: Diagnosis not present

## 2022-09-09 DIAGNOSIS — I5032 Chronic diastolic (congestive) heart failure: Secondary | ICD-10-CM | POA: Diagnosis not present

## 2022-09-09 DIAGNOSIS — I25118 Atherosclerotic heart disease of native coronary artery with other forms of angina pectoris: Secondary | ICD-10-CM | POA: Diagnosis not present

## 2022-09-10 DIAGNOSIS — E119 Type 2 diabetes mellitus without complications: Secondary | ICD-10-CM | POA: Diagnosis not present

## 2022-09-10 LAB — BASIC METABOLIC PANEL
BUN/Creatinine Ratio: 14 (ref 10–24)
BUN: 15 mg/dL (ref 8–27)
CO2: 23 mmol/L (ref 20–29)
Calcium: 10 mg/dL (ref 8.6–10.2)
Chloride: 101 mmol/L (ref 96–106)
Creatinine, Ser: 1.08 mg/dL (ref 0.76–1.27)
Glucose: 101 mg/dL — ABNORMAL HIGH (ref 70–99)
Potassium: 4.7 mmol/L (ref 3.5–5.2)
Sodium: 141 mmol/L (ref 134–144)
eGFR: 68 mL/min/{1.73_m2} (ref >59)

## 2022-09-10 LAB — LIPID PANEL WITH LDL/HDL RATIO
Cholesterol, Total: 105 mg/dL (ref 100–199)
HDL: 60 mg/dL (ref >39)
LDL Chol Calc (NIH): 32 mg/dL (ref 0–99)
LDL/HDL Ratio: 0.5 ratio (ref 0.0–3.6)
Triglycerides: 55 mg/dL (ref 0–149)
VLDL Cholesterol Cal: 13 mg/dL (ref 5–40)

## 2022-09-10 LAB — PRO B NATRIURETIC PEPTIDE: NT-Pro BNP: 539 pg/mL — ABNORMAL HIGH (ref 0–486)

## 2022-09-10 NOTE — Progress Notes (Signed)
S. Glucose improved with [place]. Will continue to monitor for clinical CHF.   CHF trial Trends??

## 2022-09-15 DIAGNOSIS — I10 Essential (primary) hypertension: Secondary | ICD-10-CM | POA: Diagnosis not present

## 2022-09-16 ENCOUNTER — Other Ambulatory Visit: Payer: Self-pay

## 2022-09-16 DIAGNOSIS — M5416 Radiculopathy, lumbar region: Secondary | ICD-10-CM | POA: Diagnosis not present

## 2022-09-16 MED ORDER — ROSUVASTATIN CALCIUM 20 MG PO TABS: 20.0000 mg | ORAL_TABLET | Freq: Every day | ORAL | 2 refills | Status: DC

## 2022-09-21 ENCOUNTER — Other Ambulatory Visit: Payer: Self-pay | Admitting: Orthopedic Surgery

## 2022-10-03 ENCOUNTER — Telehealth: Payer: Self-pay

## 2022-10-03 ENCOUNTER — Ambulatory Visit: Payer: Medicare Other | Admitting: Internal Medicine

## 2022-10-03 DIAGNOSIS — I5032 Chronic diastolic (congestive) heart failure: Secondary | ICD-10-CM

## 2022-10-03 NOTE — Telephone Encounter (Signed)
Patient home blood pressure overall is moderately controlled. Patient blood pressure tends to fluctuate. Patient is going to have back surgery in April.   Systolic Blood Pressure mmHg -- 146.7 (123XX123 - [id number]) Diastolic Blood Pressure mmHg -- 77.4 (56.0 - 122.0) Heart Rate bpm -- 65.7 (55.0 - 78.0)  10/02/22 1:07 PM 112 / 78 mmHg 73 bpm  10/01/22 10:20 AM 136 / 72 mmHg 68 bpm  09/30/22 10:04 AM 145 / 76 mmHg 68 bpm  09/29/22 7:43 PM 161 / 97 mmHg 77 bpm  09/28/22 9:56 AM 136 / 78 mmHg 78 bpm  09/27/22 10:22 PM 109 / 59 mmHg 67 bpm  09/26/22 7:36 AM 126 / 80 mmHg 75 bpm  09/22/22 9:17 PM 104 / 59 mmHg 75 bpm  09/19/22 10:49 AM 137 / 86 mmHg 66 bpm

## 2022-10-04 ENCOUNTER — Encounter: Payer: Self-pay | Admitting: Internal Medicine

## 2022-10-04 ENCOUNTER — Ambulatory Visit: Payer: Medicare Other | Admitting: Internal Medicine

## 2022-10-04 VITALS — BP 130/60 | HR 67 | Resp 16 | Ht 66.0 in | Wt 213.0 lb

## 2022-10-04 DIAGNOSIS — I25118 Atherosclerotic heart disease of native coronary artery with other forms of angina pectoris: Secondary | ICD-10-CM | POA: Diagnosis not present

## 2022-10-04 DIAGNOSIS — E7800 Pure hypercholesterolemia, unspecified: Secondary | ICD-10-CM | POA: Diagnosis not present

## 2022-10-04 DIAGNOSIS — I10 Essential (primary) hypertension: Secondary | ICD-10-CM

## 2022-10-04 DIAGNOSIS — Z951 Presence of aortocoronary bypass graft: Secondary | ICD-10-CM

## 2022-10-04 DIAGNOSIS — E119 Type 2 diabetes mellitus without complications: Secondary | ICD-10-CM | POA: Diagnosis not present

## 2022-10-04 DIAGNOSIS — Z794 Long term (current) use of insulin: Secondary | ICD-10-CM | POA: Diagnosis not present

## 2022-10-04 NOTE — Progress Notes (Signed)
Cancelled.  

## 2022-10-04 NOTE — Progress Notes (Signed)
Primary Physician/Referring:  Seward Carol, MD  [name], male    DOB: [birth year], 87 y.o.   MRN: [id number]  Chief Complaint  Patient presents with   Chronic diastolic (congestive) heart failure   Follow-up     HPI:    Mark Elliott  is a 87 y.o. African-American male with history of hypertension, hyperlipidemia, diabetes mellitus, remote history of prostate cancer, obesity. Coronary angiography on 11/15/2016 had revealed no high-grade stenosis but moderate disease in multiple vessels.  Repeat coronary angiography on 08/18/2020 by Dr. Einar Gip revealed occlusion of the ostial LCx, attempted PCI was unsuccessful.  Patient therefore underwent repeat coronary angiography by Dr. Martinique 09/30/2020 revealing severe multivessel CAD, therefore PCI was not attempted.  Patient was then referred to cardiothoracic surgery for evaluation of CABG.  Patient underwent elective CABG x4 By Dr. Orvan Seen on 10/12/2020.  Patient presents for 6 month follow up. He has been doing well from a cardiac standpoint but his back pain is very severe lately. He is due to have surgery next month and he is miserable waiting for the surgery. He is unable to do most things because his back pain is so bad. Tramadol has not really helped much at all but extra strength Tylenol seems to offer some pain relief. Patient is acceptable risk for surgery. Patient denies chest pain, shortness of breath, palpitations, diaphoresis, syncope, edema, PND, orthopnea.   Past Medical History:  Diagnosis Date   Cancer [facility])    Prostate   Coronary artery disease    Diabetes mellitus without complication ([place])    Dyspnea    with exterion   Hyperlipidemia    Hypertension    Past Surgical History:  Procedure Laterality Date   ANTERIOR FUSION CERVICAL SPINE     CORONARY ANGIOGRAPHY N/A 09/30/2020   Procedure: CORONARY ANGIOGRAPHY;  Surgeon: Martinique, Peter M, MD;  Location: [facility];  Service: Cardiovascular;   Laterality: N/A;   CORONARY ARTERY BYPASS GRAFT N/A 10/12/2020   Procedure: CORONARY ARTERY BYPASS GRAFTING (CABG) TIMES FOUR USING LEFT INTERNAL MAMMARY ARTERY AND RIGHT GREATER SAPHENOUS VEIN HARVESTED ENDOSCOPICALLY;  Surgeon: Wonda Olds, MD;  Location: [facility];  Service: Open Heart Surgery;  Laterality: N/A;   HERNIA REPAIR     INTRAVASCULAR PRESSURE WIRE/FFR STUDY N/A 09/30/2020   Procedure: INTRAVASCULAR PRESSURE WIRE/FFR STUDY;  Surgeon: Martinique, Peter M, MD;  Location: [facility];  Service: Cardiovascular;  Laterality: N/A;   LEFT HEART CATH AND CORONARY ANGIOGRAPHY N/A 11/15/2016   Procedure: Left Heart Cath and Coronary Angiography;  Surgeon: [name], MD;  Location: [facility];  Service: Cardiovascular;  Laterality: N/A;   LEFT HEART CATH AND CORONARY ANGIOGRAPHY N/A 08/18/2020   Procedure: LEFT HEART CATH AND CORONARY ANGIOGRAPHY;  Surgeon: [name], MD;  Location: [facility];  Service: Cardiovascular;  Laterality: N/A;   PROSTATE ABLATION     ROBOT ASSISTED LAPAROSCOPIC RADICAL PROSTATECTOMY  06/13/2007   TEE WITHOUT CARDIOVERSION N/A 10/12/2020   Procedure: TRANSESOPHAGEAL ECHOCARDIOGRAM (TEE);  Surgeon: Wonda Olds, MD;  Location: [facility];  Service: Open Heart Surgery;  Laterality: N/A;   Family History  Problem Relation Age of Onset   Cancer Father        liver   Cancer Brother        pancreatic    Social History   Tobacco Use   Smoking status: Former    Years: 5    Types: Cigarettes  Quit date: 02/13/1974    Years since quitting: 48.6   Smokeless tobacco: Never  Substance Use Topics   Alcohol use: No  Marital Status: Married   ROS  Review of Systems  Cardiovascular:  Negative for chest pain, claudication, dyspnea on exertion, leg swelling, near-syncope, orthopnea, palpitations, paroxysmal nocturnal dyspnea and syncope.  Respiratory:  Negative for shortness of breath.   Musculoskeletal:  Positive for back pain.  Neurological:   Negative for dizziness.   Objective  There were no vitals taken for this visit.     04/22/2022   10:03 AM 04/22/2022   10:02 AM 04/15/2022    3:04 AM  Vitals with BMI  Height   5\' 6"   Weight   220 lbs  BMI   123XX123  Systolic 0000000 [id number]   Diastolic 70 67   Pulse  51     Physical Exam Vitals reviewed.  Cardiovascular:     Rate and Rhythm: Regular rhythm. Bradycardia present.     Pulses:          Carotid pulses are 2+ on the right side and 2+ on the left side with bruit.      Radial pulses are 2+ on the right side and 2+ on the left side.       Dorsalis pedis pulses are 1+ on the right side and 1+ on the left side.       Posterior tibial pulses are 1+ on the right side and 1+ on the left side.     Heart sounds: Normal heart sounds, S1 normal and S2 normal. No murmur heard.    No gallop.     Comments:  No JVD. Pulmonary:     Effort: Pulmonary effort is normal. No respiratory distress.     Breath sounds: Normal breath sounds. No wheezing, rhonchi or rales.  Musculoskeletal:     Right lower leg: No edema.     Left lower leg: No edema.  Neurological:     Mental Status: He is alert.     Laboratory examination:   Recent Labs    04/15/22 0307 09/09/22 0739  NA 137 141  K 4.0 4.7  CL 103 101  CO2 26 23  GLUCOSE 89 101*  BUN 14 15  CREATININE 1.11 1.08  CALCIUM 9.4 10.0  GFRNONAA >60  --     CrCl cannot be calculated (Patient's most recent lab result is older than the maximum 21 days allowed.).     Latest Ref Rng & Units 09/09/2022    7:39 AM 04/15/2022    3:07 AM 10/20/2020    2:00 PM  CMP  Glucose 70 - 99 mg/dL 101  89  269   BUN 8 - 27 mg/dL 15  14  14    Creatinine 0.76 - 1.27 mg/dL 1.08  1.11  1.20   Sodium 134 - 144 mmol/L 141  137  133   Potassium 3.5 - 5.2 mmol/L 4.7  4.0  5.1   Chloride 96 - 106 mmol/L 101  103  101   CO2 20 - 29 mmol/L 23  26  25    Calcium 8.6 - 10.2 mg/dL 10.0  9.4  8.8   Total Protein 6.5 - 8.1 g/dL   5.8   Total Bilirubin 0.3 - 1.2  mg/dL   1.0   Alkaline Phos 38 - 126 U/L   48   AST 15 - 41 U/L   16   ALT 0 - 44 U/L   16  Latest Ref Rng & Units 04/15/2022    3:07 AM 10/20/2020    2:00 PM 10/15/2020    5:00 AM  CBC  WBC 4.0 - 10.5 K/uL 4.9  7.7  6.3   Hemoglobin 13.0 - 17.0 g/dL 12.4  10.0  8.8   Hematocrit 39.0 - 52.0 % 38.3  30.8  26.3   Platelets 150 - 400 K/uL 237  398  154    Lipid Panel     Component Value Date/Time   CHOL 105 09/09/2022 0740   TRIG 55 09/09/2022 0740   HDL 60 09/09/2022 0740   CHOLHDL 2.5 10/16/2020 0232   VLDL 15 10/16/2020 0232   LDLCALC 32 09/09/2022 0740   HEMOGLOBIN A1C Lab Results  Component Value Date   HGBA1C 9.0 (H) 10/08/2020   MPG 211.6 10/08/2020   TSH No results for input(s): "TSH" in the last 8760 hours.  External labs:  Ref Range & Units 3 wk ago    Cholesterol, Total 100 - 199 mg/dL 105    Triglycerides 0 - 149 mg/dL 55    HDL >39 mg/dL 60    VLDL Cholesterol Cal 5 - 40 mg/dL 13    LDL Chol Calc (NIH) 0 - 99 mg/dL 32    LDL/HDL Ratio 0.0 - 3.6 ratio 0.5      06/20/2022 12:31:54 PM MA/CR ratio 8.7 0.0-30.0 mg/G  UMA 1.67 0.00-1.90 mg/dL  UCR 193   06/20/2022 12:12:46 PM Glucose 108 70-99 mg/dL  BUN 21 6-26 mg/dL  Creatinine 1.21 0.60-1.30 mg/dl  eGFR2021 59 >60 calc Sodium 137 136-145 mmol/L  Potassium 4.7 3.5-5.5 mmol/L   Reviewed date:06/21/2022 09:02:21 AM eAG 217  Hgb A1c 9.2 4.8-5.6 %   06/20/2022 - Cholesterol 95 <200 mg/dL  CHOL/HDL 2.0 2.0-4.0 Ratio  HDLD 48 30-70 mg/dL  Triglyceride 60 0-199 mg/dL  NHDL 47 0-129 mg/dL LDL Chol Calc (NIH) 33 0-99 mg/dL   Allergies  No Known Allergies    Medications Prior to Visit:   Outpatient Medications Prior to Visit  Medication Sig Dispense Refill   ACCU-CHEK AVIVA PLUS test strip Inject 1 application as directed 2 (two) times daily.     aspirin EC 325 MG EC tablet Take 1 tablet (325 mg total) by mouth daily.     dapagliflozin propanediol (FARXIGA) 10 MG TABS tablet Take 1  tablet (10 mg total) by mouth daily before breakfast. 30 tablet 2   ezetimibe (ZETIA) 10 MG tablet Take 1 tablet (10 mg total) by mouth daily. 30 tablet 11   furosemide (LASIX) 20 MG tablet Take 1 tablet (20 mg total) by mouth daily as needed for fluid or edema. 30 tablet 3   hydrALAZINE (APRESOLINE) 50 MG tablet Take 0.5 tablets (25 mg total) by mouth 3 (three) times daily. 90 tablet 3   LANTUS SOLOSTAR 100 UNIT/ML Solostar Pen Inject 16 Units into the skin daily.     metFORMIN (GLUCOPHAGE-XR) 500 MG 24 hr tablet Take 2 tablets (1,000 mg total) by mouth 2 (two) times daily.     metoprolol tartrate (LOPRESSOR) 25 MG tablet Take 1 tablet (25 mg total) by mouth 2 (two) times daily. (Patient taking differently: Take 12.5 mg by mouth 2 (two) times daily.) 60 tablet 2   nitroGLYCERIN (NITROSTAT) 0.4 MG SL tablet Place 1 tablet (0.4 mg total) under the tongue every 5 (five) minutes as needed for chest pain. 30 tablet 3   olmesartan (BENICAR) 20 MG tablet Take 0.5 tablets (10 mg total) by mouth daily.  45 tablet 1   rosuvastatin (CRESTOR) 20 MG tablet Take 1 tablet (20 mg total) by mouth daily. 90 tablet 2   TRULICITY [id number] 0000000 SOPN Inject into the skin.     No facility-administered medications prior to visit.   Final Medications at End of Visit    No outpatient medications have been marked as taking for the 10/04/22 encounter (Office Visit) with Floydene Flock, DO.   Radiology:   No results found.  Cardiac Studies:   Coronary Artery Bypass Grafting x 4 10/12/2020: Left Internal Mammary Artery to Distal Left Anterior Descending Coronary Artery;  Saphenous Vein Graft to  Posterior Descending Coronary Artery;  Saphenous Vein Graft to 1st Obtuse Marginal Branch of Left Circumflex Coronary Artery;  Sapheonous Vein Graft to 2nd Obtuse Marginal Branch of Left Circumflex Coronary Artery;  Endoscopic Vein Harvest from right thigh and Lower Leg  Coronary Angiography 09/30/2020:  Ost LM to Mid LM  lesion is 40% stenosed. Ost LAD to Prox LAD lesion is 75% stenosed. Ost Cx to Prox Cx lesion is 100% stenosed. 1. Severe multivessel CAD with CTO of the ostial LCx and significant proximal LAD disease by iFR of 0.71 Plan: consider CABG  Left Heart Catheterization 08/18/20: LV: Normal LV systolic function. Normal EDP. No pressure gradient across the aortic valve. Left main: Very short. Mildly calcified. LAD: Large caliber vessel. Has moderate diffuse coronary calcification. Proximal segment has a 30 to 35% stenosis and mid to distal segment is diffusely diseased and focal 30% stenosis in the distal segment. No significant change from prior angiography 2018. Circumflex: Very large caliber vessel and a large vessel. Heavily calcified and flush occluded at the ostium. Faint TIMI I flow is evident distally. Collaterals are evident from the RCA to the circumflex. RCA: Dominant. Mild to moderate disease in the proximal segment with calcification. Tortuous. Distal right has calcific 60% stenosis, again not significantly changed from prior angiography in 2018. Collaterals are noted to the circumflex. Attempted angioplasty and recommendations: In spite of aggressive attempted angioplasty, failed attempt. I will refer the patient to Dr. Peter Martinique to see whether she would be a candidate for attempted revascularization of the CTO, he has gracefully reviewed the angiograms and is willing to see the patient in the outpatient basis first. 85 mL contrast utilized. He will be discharged home with outpatient follow-up.  PCV ECHOCARDIOGRAM COMPLETE 123XX123 Normal LV systolic function with visual EF 55-60%. Left ventricle cavity is normal in size. Mild left ventricular hypertrophy. Normal global wall motion. Indeterminate diastolic filling pattern, normal LAP. Mild (Grade I) mitral regurgitation. Mild tricuspid regurgitation. Compared to prior study dated 09/13/2017 no significant change.  PCV MYOCARDIAL  PERFUSION WO LEXISCAN 06/29/2020 Exercise nuclear stress test was performed using Bruce protocol. Patient reached 7.2 METS, and 86% of age predicted maximum heart rate. Exercise capacity was low. No chest pain reported. Heart rate and hemodynamic response were normal. Peak stress EKG showed sinus tachycardia, RBBB, nonspecific T wave changes inferior leads. SPECT images showed large sized, severe intensity, mid to basal, predominantly reversible perfusion defect in anterolateral, inferolateral myocardium. Intermediate risk study.  Carotid artery duplex  10/25/2019:  Diffuse homogeneous plaque in bilateral common carotid arteries with <50%  stenosis.  Bilateral external carotid stenosis <50%.  Antegrade bilateral vertebral flow.  Compared to 03/21/2018, left ICA stenosis of 50-69% not present.  Follow up studies when clinically indicated.  See enclosed images.  EKG    04/04/22: NSR with RBBB, no change compared to prior  09/24/2021: Sinus  bradycardia rate 47 bpm.  Left axis, left anterior fascicular block.  Right bundle branch block.  No evidence of ischemia or underlying injury pattern.  Compared EKG 04/29/2021, no significant change.  Assessment     ICD-10-CM   1. Coronary artery disease of native artery of native heart with stable angina pectoris ([place])  I25.118     2. Hx of CABG  Z95.1     3. Essential hypertension  I10     4. Hypercholesteremia  E78.00     5. Type 2 diabetes mellitus without complication, with long-term current use of insulin (HCC)  E11.9    Z79.4       No orders of the defined types were placed in this encounter.   There are no discontinued medications.   Recommendations:   Mark Elliott  is a  87 y.o. African-American male with history of hypertension, hyperlipidemia, diabetes mellitus, remote history of prostate cancer, obesity. Coronary angiography on 11/15/2016 had revealed no high-grade stenosis but moderate disease in multiple vessels.  Repeat  coronary angiography on 08/18/2020 by Dr. Einar Gip revealed occlusion of the ostial LCx, attempted PCI was unsuccessful.  Patient therefore underwent repeat coronary angiography by Dr. Martinique 09/30/2020 revealing severe multivessel CAD, therefore PCI was not attempted.  Patient was then referred to cardiothoracic surgery for evaluation of CABG.  Patient underwent elective CABG x4 By Dr. Orvan Seen on 10/12/2020.  Coronary artery disease of native artery of native heart s/p CABG x4 CABG was less than 2 years ago, patient has not had any anginal symptoms since He is scheduled to have surgery next month, he is acceptable risk and optimized from a cardiac standpoint  Essential hypertension Continue current cardiac medications. Encourage low-sodium diet, less than 2000 mg daily.  Hypercholesteremia LDL is 32. Very well controlled on Crestor  Type 2 diabetes mellitus without complication, with long-term current use of insulin (HCC) Continue Farxiga Tolerating 10 mg without issues   Follow-up in 6-12 months, sooner if needed.    Floydene Flock, DO, [facility] 10/04/2022, 11:41 AM Office: [phone]

## 2022-10-11 DIAGNOSIS — E119 Type 2 diabetes mellitus without complications: Secondary | ICD-10-CM | POA: Diagnosis not present

## 2022-10-14 NOTE — Progress Notes (Signed)
Surgical Instructions    Your procedure is scheduled on Wednesday, 10/26/22.  Report to [facility] Main Entrance "A" at 12:20 P.M., then check in with the Admitting office.  Call this number if you have problems the morning of surgery:  [phone]   If you have any questions prior to your surgery date call [phone]: Open Monday-Friday 8am-4pm If you experience any cold or flu symptoms such as cough, fever, chills, shortness of breath, etc. between now and your scheduled surgery, please notify us at the above number     Remember:  Do not eat after midnight the night before your surgery  You may drink clear liquids until 12:20PM the morning of your surgery.   Clear liquids allowed are: Water, Non-Citrus Juices (without pulp), Carbonated Beverages, Clear Tea, Black Coffee ONLY (NO MILK, CREAM OR POWDERED CREAMER of any kind), and Gatorade  Patient Instructions  The night before surgery:  No food after midnight. ONLY clear liquids after midnight   The day of surgery (if you have diabetes): Drink ONE (1) 12 oz G2 given to you in your pre admission testing appointment by 12:20 PM the morning of surgery. Drink in one sitting. Do not sip.  This drink was given to you during your hospital  pre-op appointment visit.  Nothing else to drink after completing the  12 oz bottle of G2.         If you have questions, please contact your surgeon's office.     Take these medicines the morning of surgery with A SIP OF WATER:  ezetimibe (ZETIA)  metoprolol tartrate (LOPRESSOR)  rosuvastatin (CRESTOR)   IF NEEDED: acetaminophen (TYLENOL)  hydrALAZINE (APRESOLINE)  traMADol (ULTRAM)   As of today, STOP taking any Aspirin (unless otherwise instructed by your surgeon) Aleve, Naproxen, Ibuprofen, Motrin, Advil, Goody's, BC's, all herbal medications, fish oil, and all vitamins.  WHAT DO I DO ABOUT MY DIABETES MEDICATION?   Do not take oral diabetes medicines (pills) the morning of  surgery.  Do not take dapagliflozin propanediol (FARXIGA) 72 hours prior to surgery. Your last dose will be 10/22/22.  Do not take Dulaglutide 7 days prior to surgery. Do not take after 10/18/22.  THE NIGHT BEFORE SURGERY, take 12 units of LANTUS SOLOSTAR.     THE MORNING OF SURGERY, do not take metFORMIN (GLUCOPHAGE-XR).  The day of surgery, do not take other diabetes injectables, including Byetta (exenatide), Bydureon (exenatide ER), Victoza (liraglutide), or Trulicity (dulaglutide).  If your CBG is greater than 220 mg/dL, you may take  of your sliding scale (correction) dose of insulin.   HOW TO MANAGE YOUR DIABETES BEFORE AND AFTER SURGERY  Why is it important to control my blood sugar before and after surgery? Improving blood sugar levels before and after surgery helps healing and can limit problems. A way of improving blood sugar control is eating a healthy diet by:  Eating less sugar and carbohydrates  Increasing activity/exercise  Talking with your doctor about reaching your blood sugar goals High blood sugars (greater than 180 mg/dL) can raise your risk of infections and slow your recovery, so you will need to focus on controlling your diabetes during the weeks before surgery. Make sure that the doctor who takes care of your diabetes knows about your planned surgery including the date and location.  How do I manage my blood sugar before surgery? Check your blood sugar at least 4 times a day, starting 2 days before surgery, to make sure that the level is not  too high or low.  Check your blood sugar the morning of your surgery when you wake up and every 2 hours until you get to the Short Stay unit.  If your blood sugar is less than 70 mg/dL, you will need to treat for low blood sugar: Do not take insulin. Treat a low blood sugar (less than 70 mg/dL) with  cup of clear juice (cranberry or apple), 4 glucose tablets, OR glucose gel. Recheck blood sugar in 15 minutes after  treatment (to make sure it is greater than 70 mg/dL). If your blood sugar is not greater than 70 mg/dL on recheck, call [phone] for further instructions. Report your blood sugar to the short stay nurse when you get to Short Stay.  If you are admitted to the hospital after surgery: Your blood sugar will be checked by the staff and you will probably be given insulin after surgery (instead of oral diabetes medicines) to make sure you have good blood sugar levels. The goal for blood sugar control after surgery is 80-180 mg/dL.            Do not wear jewelry or makeup. Do not wear lotions, powders, perfumes/cologne or deodorant. Men may shave face and neck. Do not bring valuables to the hospital. Do not wear nail polish, gel polish, artificial nails, or any other type of covering on natural nails (fingers and toes) If you have artificial nails or gel coating that need to be removed by a nail salon, please have this removed prior to surgery. Artificial nails or gel coating may interfere with anesthesia's ability to adequately monitor your vital signs.  [facility] is not responsible for any belongings or valuables.    Do NOT Smoke (Tobacco/Vaping)  24 hours prior to your procedure  If you use a CPAP at night, you may bring your mask for your overnight stay.   Contacts, glasses, hearing aids, dentures or partials may not be worn into surgery, please bring cases for these belongings   For patients admitted to the hospital, discharge time will be determined by your treatment team.   Patients discharged the day of surgery will not be allowed to drive home, and someone needs to stay with them for 24 hours.   SURGICAL WAITING ROOM VISITATION Patients having surgery or a procedure may have no more than 2 support people in the waiting area - these visitors may rotate.   Children under the age of 85 must have an adult with them who is not the patient. If the patient needs to stay at the  hospital during part of their recovery, the visitor guidelines for inpatient rooms apply. Pre-op nurse will coordinate an appropriate time for 1 support person to accompany patient in pre-op.  This support person may not rotate.   Please refer to [email or web address] for the visitor guidelines for Inpatients (after your surgery is over and you are in a regular room).    Special instructions:    Oral Hygiene is also important to reduce your risk of infection.  Remember - BRUSH YOUR TEETH THE MORNING OF SURGERY WITH YOUR REGULAR TOOTHPASTE   [facility]- Preparing For Surgery  Before surgery, you can play an important role. Because skin is not sterile, your skin needs to be as free of germs as possible. You can reduce the number of germs on your skin by washing with CHG (chlorahexidine gluconate) Soap before surgery.  CHG is an antiseptic cleaner which kills germs and bonds with the skin to  continue killing germs even after washing.     Please do not use if you have an allergy to CHG or antibacterial soaps. If your skin becomes reddened/irritated stop using the CHG.  Do not shave (including legs and underarms) for at least 48 hours prior to first CHG shower. It is OK to shave your face.    Day of Surgery: Take a shower with CHG soap. Wear Clean/Comfortable clothing the morning of surgery Do not apply any deodorants/lotions.   Remember to brush your teeth WITH YOUR REGULAR TOOTHPASTE.    If you received a COVID test during your pre-op visit, it is requested that you wear a mask when out in public, stay away from anyone that may not be feeling well, and notify your surgeon if you develop symptoms. If you have been in contact with anyone that has tested positive in the last 10 days, please notify your surgeon.    Please read over the following fact sheets that you were given.

## 2022-10-15 DIAGNOSIS — I10 Essential (primary) hypertension: Secondary | ICD-10-CM | POA: Diagnosis not present

## 2022-10-17 ENCOUNTER — Other Ambulatory Visit: Payer: Self-pay

## 2022-10-17 ENCOUNTER — Encounter (HOSPITAL_COMMUNITY): Payer: Self-pay

## 2022-10-17 ENCOUNTER — Encounter (HOSPITAL_COMMUNITY)
Admission: RE | Admit: 2022-10-17 | Discharge: 2022-10-17 | Disposition: A | Discharge status: Home / Self Care | Payer: Medicare Other | Source: Ambulatory Visit | Attending: Orthopedic Surgery | Admitting: Orthopedic Surgery

## 2022-10-17 VITALS — BP 131/56 | HR 70 | Temp 97.9°F | Resp 18 | Ht 66.0 in | Wt 205.6 lb

## 2022-10-17 DIAGNOSIS — I10 Essential (primary) hypertension: Secondary | ICD-10-CM | POA: Diagnosis not present

## 2022-10-17 DIAGNOSIS — M48061 Spinal stenosis, lumbar region without neurogenic claudication: Secondary | ICD-10-CM | POA: Diagnosis not present

## 2022-10-17 DIAGNOSIS — C7982 Secondary malignant neoplasm of genital organs: Secondary | ICD-10-CM | POA: Insufficient documentation

## 2022-10-17 DIAGNOSIS — M79604 Pain in right leg: Secondary | ICD-10-CM | POA: Diagnosis not present

## 2022-10-17 DIAGNOSIS — I2510 Atherosclerotic heart disease of native coronary artery without angina pectoris: Secondary | ICD-10-CM | POA: Insufficient documentation

## 2022-10-17 DIAGNOSIS — M4807 Spinal stenosis, lumbosacral region: Secondary | ICD-10-CM | POA: Diagnosis not present

## 2022-10-17 DIAGNOSIS — Z01812 Encounter for preprocedural laboratory examination: Secondary | ICD-10-CM | POA: Insufficient documentation

## 2022-10-17 DIAGNOSIS — Z87891 Personal history of nicotine dependence: Secondary | ICD-10-CM | POA: Diagnosis not present

## 2022-10-17 DIAGNOSIS — Z951 Presence of aortocoronary bypass graft: Secondary | ICD-10-CM | POA: Insufficient documentation

## 2022-10-17 DIAGNOSIS — E119 Type 2 diabetes mellitus without complications: Secondary | ICD-10-CM | POA: Diagnosis not present

## 2022-10-17 DIAGNOSIS — Z01818 Encounter for other preprocedural examination: Secondary | ICD-10-CM

## 2022-10-17 LAB — CBC
HCT: 41.6 % (ref 39.0–52.0)
Hemoglobin: 14 g/dL (ref 13.0–17.0)
MCH: 29 pg (ref 26.0–34.0)
MCHC: 33.7 g/dL (ref 30.0–36.0)
MCV: 86.1 fL (ref 80.0–100.0)
Platelets: 279 10*3/uL (ref 150–400)
RBC: 4.83 MIL/uL (ref 4.22–5.81)
RDW: 12.9 % (ref 11.5–15.5)
WBC: 6.6 10*3/uL (ref 4.0–10.5)
nRBC: 0 % (ref 0.0–0.2)

## 2022-10-17 LAB — SURGICAL PCR SCREEN
MRSA, PCR: NEGATIVE
Staphylococcus aureus: NEGATIVE

## 2022-10-17 LAB — BASIC METABOLIC PANEL
Anion gap: 11 (ref 5–15)
BUN: 23 mg/dL (ref 8–23)
CO2: 24 mmol/L (ref 22–32)
Calcium: 9.5 mg/dL (ref 8.9–10.3)
Chloride: 100 mmol/L (ref 98–111)
Creatinine, Ser: 1.27 mg/dL — ABNORMAL HIGH (ref 0.61–1.24)
GFR, Estimated: 56 mL/min — ABNORMAL LOW (ref >60)
Glucose, Bld: 142 mg/dL — ABNORMAL HIGH (ref 70–99)
Potassium: 4.5 mmol/L (ref 3.5–5.1)
Sodium: 135 mmol/L (ref 135–145)

## 2022-10-17 LAB — HEMOGLOBIN A1C
Hgb A1c MFr Bld: 7.6 % — ABNORMAL HIGH (ref 4.8–5.6)
Mean Plasma Glucose: 171.42 mg/dL

## 2022-10-17 LAB — GLUCOSE, CAPILLARY: Glucose-Capillary: 130 mg/dL — ABNORMAL HIGH (ref 70–99)

## 2022-10-17 NOTE — Progress Notes (Addendum)
PCP - Dr. Renford Dills Cardiologist - Dr. Yates Decamp  PPM/ICD - denies   Chest x-ray - 04/15/22 EKG - 09/30/22 (not released. Spoke with assistant at Dr. Verl Dicker office and she said she would try to figure out how to release the EKG). Otherwise, pt had another EKG  on 04/15/22  Addendum: Mare Loan, with Dr. Verl Dicker office, called back and said no EKG was done on 3/22. The comments in the Cardiologist's note referred to last EKG on 04/15/22.  Stress Test - 06/29/20 ECHO - 10/12/20 Cardiac Cath - 09/30/20  Sleep Study - denies   Fasting Blood Sugar - 100-120 Checks Blood Sugar twice a day  Last dose of GLP1 agonist-  4/3 GLP1 instructions: Hold 7 days. Do not take dose on 4/10  Blood Thinner Instructions: n/a Aspirin Instructions: hold 5-7 days. Last dose 4/9  ERAS Protcol - yes PRE-SURGERY G2- given at PAT   COVID TEST- n/a   Anesthesia review: yes, cardiac hx.  Patient denies shortness of breath, fever, cough and chest pain at PAT appointment   All instructions explained to the patient, with a verbal understanding of the material. Patient agrees to go over the instructions while at home for a better understanding.  The opportunity to ask questions was provided.

## 2022-10-18 NOTE — Anesthesia Preprocedure Evaluation (Addendum)
Anesthesia Evaluation  Patient identified by MRN, date of birth, ID band Patient awake    Reviewed: Allergy & Precautions, H&P , NPO status , Patient's Chart, lab work & pertinent test results  Airway Mallampati: II   Neck ROM: full    Dental   Pulmonary former smoker   breath sounds clear to auscultation       Cardiovascular hypertension, + CAD and + CABG   Rhythm:regular Rate:Normal     Neuro/Psych    GI/Hepatic   Endo/Other  diabetes, Type 2    Renal/GU      Musculoskeletal   Abdominal   Peds  Hematology   Anesthesia Other Findings   Reproductive/Obstetrics                             Anesthesia Physical Anesthesia Plan  ASA: 3  Anesthesia Plan: General   Post-op Pain Management:    Induction: Intravenous  PONV Risk Score and Plan: 2 and Ondansetron, Dexamethasone, Midazolam and Treatment may vary due to age or medical condition  Airway Management Planned: Oral ETT  Additional Equipment:   Intra-op Plan:   Post-operative Plan: Extubation in OR  Informed Consent: I have reviewed the patients History and Physical, chart, labs and discussed the procedure including the risks, benefits and alternatives for the proposed anesthesia with the patient or authorized representative who has indicated his/her understanding and acceptance.     Dental advisory given  Plan Discussed with: CRNA, Anesthesiologist and Surgeon  Anesthesia Plan Comments: (PAT note written 10/18/2022 by Shonna Chock, PA-C.  )       Anesthesia Quick Evaluation

## 2022-10-18 NOTE — Progress Notes (Signed)
Anesthesia Chart Review:  Case: [id number] Date/Time: 10/26/22 0815   Procedure: LUMBAR 4- LUMBAR 5, LUMBAR 5- SACRUM 1 DECOMPRESSION   Anesthesia type: General   Pre-op diagnosis: L4-L5 and L5-S1 stenosis resulting in left or the right leg pain   [facility] / [facility]   Surgeons: Estill Bamberg, MD       DISCUSSION: Patient is an 87 year old male scheduled for the above procedure.  History includes former smoker (quit 02/13/74), CAD (occluded ostial LCX with collaterals from RCA, unsuccessful PCI 08/18/20; s/p CABG: LIMA-LAD, SVG-RPDA, SVG-OM1, SVG-OM2 10/12/20), DM2, HTN, exertional dyspnea, prostate cancer (s/p robotic-assisted laparoscopic radical retropubic prostatectomy 06/13/07), neck surgery (C3-6 ACDF 06/04/99). BMI is consistent with obesity.   Last cardiology visit with Dr. Clotilde Dieter was on 10/04/22. Overall doing well from a cardiac standpoint but with severe back pain and awaiting surgery. No anginal symptoms. He was felt to be "acceptable risk and optimized from a cardiac standpoint." He reported last ASA dose scheduled for 10/19/22.   Last dulaglutide 10/12/22. He was advised to hold 10/19/22 dose per guidelines.   Anesthesia team to evaluate on the day of surgery.    VS: BP (!) 131/56   Pulse 70   Temp 36.6 C   Resp 18   Ht 5\' 6"  (1.676 m)   Wt 93.3 kg   SpO2 100%   BMI 33.18 kg/m   PROVIDERS: Renford Dills, MD is PCP  Yates Decamp, MD & Clotilde Dieter, DO are cardiologists [facility])    LABS: Labs reviewed: Acceptable for surgery. Creatinine 1.27, previously ~ 1.1-1.2 when compared to prior labs in [facility] and was 1.33 on 12/15/21 and 1.21 on 06/20/22 at [facility] (see CE).   (all labs ordered are listed, but only abnormal results are displayed)  Labs Reviewed  GLUCOSE, CAPILLARY - Abnormal; Notable for the following components:      Result Value   Glucose-Capillary 130 (*)    All other components within normal limits  HEMOGLOBIN A1C -  Abnormal; Notable for the following components:   Hgb A1c MFr Bld 7.6 (*)    All other components within normal limits  BASIC METABOLIC PANEL - Abnormal; Notable for the following components:   Glucose, Bld 142 (*)    Creatinine, Ser 1.27 (*)    GFR, Estimated 56 (*)    All other components within normal limits  SURGICAL PCR SCREEN  CBC     IMAGES: MRI L-spine 04/19/22: IMPRESSION: 1. Transitional anatomy, with 4 lumbar-type vertebral bodies and a sacralized L5. Please correlate with imaging if any intervention is planned. 2. L2-L3 mild-to-moderate spinal canal stenosis with moderate left and mild right neural foraminal narrowing. 3. L3-L4 mild-to-moderate spinal canal stenosis with moderate bilateral neural foraminal narrowing. 4. L4-L5 severe left and moderate right neural foraminal narrowing. 5. Narrowing of the lateral recesses at L2-L3, L3-L4, and L4-L5 could affect the descending L3, L4, and L5 nerve roots, respectively.  CXR 04/15/22: FINDINGS: - There is a low depth of inspiration compared to the prior study. Increased mild elevation right hemidiaphragm. No focal pneumonia is evident. The sulci are sharp. - Heart size and vasculature and mediastinal configuration are normal with old CABG. - There is aortic atherosclerosis. Fusion plating lower cervical spine. IMPRESSION: No active cardiopulmonary disease.    EKG: 04/15/22:  Sinus bradycardia Left axis deviation Right bundle branch block Minimal voltage criteria for LVH, may be normal variant ( R in aVL ) Abnormal ECG When compared with  ECG of 20-Oct-2020 12:02, PREVIOUS ECG IS PRESENT similar to previous except increse IVCD III, aVR and aVL Confirmed by Arby BarrettePfeiffer, Marcy [phone]([id number]) on 04/16/2022 2:48:11 PM   CV: TEE (Intra-op CABG) 10/12/20: POST-OP IMPRESSIONS  Limited Post CPB exam: The patient separated easily from CPB.  - Left Ventricle: Left ventricular function is essentially unchanged from  pre-bypass  images. Overall contractility appears normal, with no regional  motion  abnormalities. EF 55-60%.  - Right Ventricle: RV function remains mildly reduced. Contractility is  improving with time off of CPB. The cavity size is normal.  - Aortic Valve: The aortic valve function appears unchanged from  pre-bypass  images.  - Mitral Valve: The mitral valve function appears unchanged from  pre-bypass  images.  - Tricuspid Valve: Tricuspid valve function is essentially unchanged from  pre-CPB images. There is mild tricuspid regurgitation.    Carotid US 10/08/20: Summary:  Right Carotid: Velocities in the right ICA are consistent with a 1-39%  stenosis.  Left Carotid: Velocities in the left ICA are consistent with a 1-39%  stenosis.  Vertebrals:  Bilateral vertebral arteries demonstrate antegrade flow.  Subclavians: Normal flow hemodynamics were seen in bilateral subclavian arteries.      Cardiac cath 09/30/20 (PRE-CABG): Ost LM to Mid LM lesion is 40% stenosed. Ost LAD to Prox LAD lesion is 75% stenosed. Ost Cx to Prox Cx lesion is 100% stenosed.  1. Severe multivessel CAD with CTO of the ostial LCx and significant proximal LAD disease by iFR of 0.71 Plan: consider CABG - S/p CABG: LIMA-LAD, SVG-RPDA, SVG-OM1, SVG-OM2 10/12/20     Echocardiogram 07/14/20:  Normal LV systolic function with visual EF 55-60%. Left ventricle cavity  is normal in size. Mild left ventricular hypertrophy. Normal global wall  motion. Indeterminate diastolic filling pattern, normal LAP.  Mild (Grade I) mitral regurgitation.  Mild tricuspid regurgitation.  Compared to prior study dated 09/13/2017 no significant change.   Past Medical History:  Diagnosis Date   Cancer    Prostate   Coronary artery disease    Diabetes mellitus without complication    Dyspnea    with exterion   Hyperlipidemia    Hypertension     Past Surgical History:  Procedure Laterality Date   ANTERIOR FUSION CERVICAL SPINE      CORONARY ANGIOGRAPHY N/A 09/30/2020   Procedure: CORONARY ANGIOGRAPHY;  Surgeon: SwazilandJordan, Peter M, MD;  [facility];  Service: Cardiovascular;  Laterality: N/A;   CORONARY ARTERY BYPASS GRAFT N/A 10/12/2020   Procedure: CORONARY ARTERY BYPASS GRAFTING (CABG) TIMES FOUR USING LEFT INTERNAL MAMMARY ARTERY AND RIGHT GREATER SAPHENOUS VEIN HARVESTED ENDOSCOPICALLY;  Surgeon: Linden DolinAtkins, Broadus Z, MD;  Location: [facility] OR;  Service: Open Heart Surgery;  Laterality: N/A;   CORONARY PRESSURE/FFR STUDY N/A 09/30/2020   Procedure: INTRAVASCULAR PRESSURE WIRE/FFR STUDY;  Surgeon: SwazilandJordan, Peter M, MD;  [facility];  Service: Cardiovascular;  Laterality: N/A;   HERNIA REPAIR     LEFT HEART CATH AND CORONARY ANGIOGRAPHY N/A 11/15/2016   Procedure: Left Heart Cath and Coronary Angiography;  Surgeon: Yates DecampGanji, Jay, MD;  [facility];  Service: Cardiovascular;  Laterality: N/A;   LEFT HEART CATH AND CORONARY ANGIOGRAPHY N/A 08/18/2020   Procedure: LEFT HEART CATH AND CORONARY ANGIOGRAPHY;  Surgeon: Yates DecampGanji, Jay, MD;  [facility];  Service: Cardiovascular;  Laterality: N/A;   PROSTATE ABLATION     ROBOT ASSISTED LAPAROSCOPIC RADICAL PROSTATECTOMY  06/13/2007   TEE WITHOUT CARDIOVERSION N/A  10/12/2020   Procedure: TRANSESOPHAGEAL ECHOCARDIOGRAM (TEE);  Surgeon: Linden Dolin, MD;  Location: [facility] OR;  Service: Open Heart Surgery;  Laterality: N/A;   TONSILLECTOMY     removed when pt was in high school    MEDICATIONS:  ACCU-CHEK AVIVA PLUS test strip   acetaminophen (TYLENOL) 650 MG CR tablet   aspirin EC 325 MG EC tablet   aspirin EC 81 MG tablet   dapagliflozin propanediol (FARXIGA) 10 MG TABS tablet   Dulaglutide 3 MG/0.5ML SOPN   ezetimibe (ZETIA) 10 MG tablet   hydrALAZINE (APRESOLINE) 25 MG tablet   hydrALAZINE (APRESOLINE) 50 MG tablet   LANTUS SOLOSTAR 100 UNIT/ML Solostar Pen   metFORMIN (GLUCOPHAGE-XR) 500 MG 24 hr tablet   metoprolol  tartrate (LOPRESSOR) 25 MG tablet   nitroGLYCERIN (NITROSTAT) 0.4 MG SL tablet   olmesartan (BENICAR) 20 MG tablet   rosuvastatin (CRESTOR) 20 MG tablet   traMADol (ULTRAM) 50 MG tablet   No current facility-administered medications for this encounter.  Current medication list he is not taking olmesartan, hydralazine 25 mg TID (taking as needed daily for SBP > 150), ASA 325 mg (taking 81 mg).  Shonna Chock, PA-C Surgical Short Stay/Anesthesiology [facility] Phone [phone] [facility] Phone [phone] 10/18/2022 7:16 PM

## 2022-10-20 DIAGNOSIS — H40013 Open angle with borderline findings, low risk, bilateral: Secondary | ICD-10-CM | POA: Diagnosis not present

## 2022-10-20 DIAGNOSIS — E119 Type 2 diabetes mellitus without complications: Secondary | ICD-10-CM | POA: Diagnosis not present

## 2022-10-20 DIAGNOSIS — H35033 Hypertensive retinopathy, bilateral: Secondary | ICD-10-CM | POA: Diagnosis not present

## 2022-10-20 DIAGNOSIS — H524 Presbyopia: Secondary | ICD-10-CM | POA: Diagnosis not present

## 2022-10-20 DIAGNOSIS — H35371 Puckering of macula, right eye: Secondary | ICD-10-CM | POA: Diagnosis not present

## 2022-10-24 ENCOUNTER — Encounter: Payer: Self-pay | Admitting: Cardiology

## 2022-10-25 DIAGNOSIS — E7800 Pure hypercholesterolemia, unspecified: Secondary | ICD-10-CM | POA: Diagnosis not present

## 2022-10-25 DIAGNOSIS — I10 Essential (primary) hypertension: Secondary | ICD-10-CM | POA: Diagnosis not present

## 2022-10-25 DIAGNOSIS — I25110 Atherosclerotic heart disease of native coronary artery with unstable angina pectoris: Secondary | ICD-10-CM | POA: Diagnosis not present

## 2022-10-25 DIAGNOSIS — E119 Type 2 diabetes mellitus without complications: Secondary | ICD-10-CM | POA: Diagnosis not present

## 2022-10-25 DIAGNOSIS — M48061 Spinal stenosis, lumbar region without neurogenic claudication: Secondary | ICD-10-CM | POA: Diagnosis not present

## 2022-10-25 NOTE — Progress Notes (Signed)
Patient was called and informed that the surgery time for tomorrow was changed to 12:39 o'clock. Patient was instructed to be at the hospital at 09:40 o'clock and stop clear liquids at 09:40 o'clock. Patient verbalized understanding.

## 2022-10-26 ENCOUNTER — Other Ambulatory Visit: Payer: Self-pay

## 2022-10-26 ENCOUNTER — Encounter (HOSPITAL_COMMUNITY)
Admission: RE | Disposition: A | Discharge status: Home / Self Care | Payer: Self-pay | Source: Ambulatory Visit | Attending: Orthopedic Surgery

## 2022-10-26 ENCOUNTER — Ambulatory Visit (HOSPITAL_COMMUNITY): Payer: Medicare Other | Admitting: Vascular Surgery

## 2022-10-26 ENCOUNTER — Ambulatory Visit (HOSPITAL_COMMUNITY): Payer: Medicare Other

## 2022-10-26 ENCOUNTER — Encounter (HOSPITAL_COMMUNITY): Payer: Self-pay | Admitting: Orthopedic Surgery

## 2022-10-26 ENCOUNTER — Ambulatory Visit (HOSPITAL_BASED_OUTPATIENT_CLINIC_OR_DEPARTMENT_OTHER): Payer: Medicare Other | Admitting: Vascular Surgery

## 2022-10-26 ENCOUNTER — Observation Stay (HOSPITAL_COMMUNITY)
Admission: RE | Admit: 2022-10-26 | Discharge: 2022-10-27 | Disposition: A | Discharge status: Home / Self Care | Payer: Medicare Other | Source: Ambulatory Visit | Attending: Orthopedic Surgery | Admitting: Orthopedic Surgery

## 2022-10-26 DIAGNOSIS — E119 Type 2 diabetes mellitus without complications: Secondary | ICD-10-CM | POA: Diagnosis not present

## 2022-10-26 DIAGNOSIS — Z87891 Personal history of nicotine dependence: Secondary | ICD-10-CM

## 2022-10-26 DIAGNOSIS — I10 Essential (primary) hypertension: Secondary | ICD-10-CM | POA: Diagnosis not present

## 2022-10-26 DIAGNOSIS — M4807 Spinal stenosis, lumbosacral region: Secondary | ICD-10-CM

## 2022-10-26 DIAGNOSIS — I2510 Atherosclerotic heart disease of native coronary artery without angina pectoris: Secondary | ICD-10-CM | POA: Insufficient documentation

## 2022-10-26 DIAGNOSIS — Z981 Arthrodesis status: Secondary | ICD-10-CM | POA: Diagnosis not present

## 2022-10-26 DIAGNOSIS — Z7982 Long term (current) use of aspirin: Secondary | ICD-10-CM | POA: Diagnosis not present

## 2022-10-26 DIAGNOSIS — Z8546 Personal history of malignant neoplasm of prostate: Secondary | ICD-10-CM | POA: Diagnosis not present

## 2022-10-26 DIAGNOSIS — M5416 Radiculopathy, lumbar region: Secondary | ICD-10-CM | POA: Diagnosis not present

## 2022-10-26 DIAGNOSIS — M48061 Spinal stenosis, lumbar region without neurogenic claudication: Secondary | ICD-10-CM | POA: Diagnosis not present

## 2022-10-26 DIAGNOSIS — Z951 Presence of aortocoronary bypass graft: Secondary | ICD-10-CM | POA: Insufficient documentation

## 2022-10-26 DIAGNOSIS — Z7984 Long term (current) use of oral hypoglycemic drugs: Secondary | ICD-10-CM | POA: Diagnosis not present

## 2022-10-26 HISTORY — PX: LUMBAR LAMINECTOMY/DECOMPRESSION MICRODISCECTOMY: SHX5026

## 2022-10-26 LAB — POCT I-STAT, CHEM 8
BUN: 14 mg/dL (ref 8–23)
Calcium, Ion: 1.31 mmol/L (ref 1.15–1.40)
Chloride: 103 mmol/L (ref 98–111)
Creatinine, Ser: 0.9 mg/dL (ref 0.61–1.24)
Glucose, Bld: 56 mg/dL — ABNORMAL LOW (ref 70–99)
HCT: 35 % — ABNORMAL LOW (ref 39.0–52.0)
Hemoglobin: 11.9 g/dL — ABNORMAL LOW (ref 13.0–17.0)
Potassium: 3.7 mmol/L (ref 3.5–5.1)
Sodium: 138 mmol/L (ref 135–145)
TCO2: 22 mmol/L (ref 22–32)

## 2022-10-26 LAB — GLUCOSE, CAPILLARY
Glucose-Capillary: 57 mg/dL — ABNORMAL LOW (ref 70–99)
Glucose-Capillary: 98 mg/dL (ref 70–99)
Glucose-Capillary: 65 mg/dL — ABNORMAL LOW (ref 70–99)
Glucose-Capillary: 81 mg/dL (ref 70–99)
Glucose-Capillary: 72 mg/dL (ref 70–99)
Glucose-Capillary: 101 mg/dL — ABNORMAL HIGH (ref 70–99)
Glucose-Capillary: 295 mg/dL — ABNORMAL HIGH (ref 70–99)

## 2022-10-26 SURGERY — LUMBAR LAMINECTOMY/DECOMPRESSION MICRODISCECTOMY
Anesthesia: General | Site: Spine Lumbar

## 2022-10-26 MED ORDER — LIDOCAINE 2% (20 MG/ML) 5 ML SYRINGE
INTRAMUSCULAR | Status: DC | PRN
  Administered 2022-10-26: 60 mg via INTRAVENOUS

## 2022-10-26 MED ORDER — THROMBIN 20000 UNITS EX SOLR
CUTANEOUS | Status: AC
  Filled 2022-10-26: qty 20000

## 2022-10-26 MED ORDER — DEXTROSE 50 % IV SOLN
INTRAVENOUS | Status: AC
  Filled 2022-10-26: qty 50

## 2022-10-26 MED ORDER — DEXTROSE 50 % IV SOLN: 12.5000 g | INTRAVENOUS | Status: AC

## 2022-10-26 MED ORDER — DOCUSATE SODIUM 100 MG PO CAPS
100.0000 mg | ORAL_CAPSULE | Freq: Two times a day (BID) | ORAL | Status: DC
  Administered 2022-10-26 – 2022-10-27 (×2): 100 mg via ORAL
  Filled 2022-10-26 (×2): qty 1

## 2022-10-26 MED ORDER — HYDRALAZINE HCL 25 MG PO TABS
25.0000 mg | ORAL_TABLET | Freq: Three times a day (TID) | ORAL | Status: DC
  Administered 2022-10-26 – 2022-10-27 (×2): 25 mg via ORAL
  Filled 2022-10-26 (×2): qty 1

## 2022-10-26 MED ORDER — THROMBIN 20000 UNITS EX KIT
PACK | CUTANEOUS | Status: DC | PRN
  Administered 2022-10-26: 20000 [IU] via TOPICAL

## 2022-10-26 MED ORDER — SURGIFLO WITH THROMBIN (HEMOSTATIC MATRIX KIT) OPTIME
TOPICAL | Status: DC | PRN
  Administered 2022-10-26: 1 via TOPICAL

## 2022-10-26 MED ORDER — ALBUMIN HUMAN 5 % IV SOLN: INTRAVENOUS | Status: DC | PRN

## 2022-10-26 MED ORDER — ZOLPIDEM TARTRATE 5 MG PO TABS: 5.0000 mg | ORAL_TABLET | Freq: Every evening | ORAL | Status: DC | PRN

## 2022-10-26 MED ORDER — PROPOFOL 10 MG/ML IV BOLUS
INTRAVENOUS | Status: DC | PRN
  Administered 2022-10-26: 150 mg via INTRAVENOUS

## 2022-10-26 MED ORDER — SUGAMMADEX SODIUM 200 MG/2ML IV SOLN
INTRAVENOUS | Status: DC | PRN
  Administered 2022-10-26: 200 mg via INTRAVENOUS

## 2022-10-26 MED ORDER — FLEET ENEMA 7-19 GM/118ML RE ENEM: 1.0000 | ENEMA | Freq: Once | RECTAL | Status: DC | PRN

## 2022-10-26 MED ORDER — PHENYLEPHRINE HCL-NACL 20-0.9 MG/250ML-% IV SOLN
INTRAVENOUS | Status: DC | PRN
  Administered 2022-10-26: 30 ug/min via INTRAVENOUS

## 2022-10-26 MED ORDER — MENTHOL 3 MG MT LOZG: 1.0000 | LOZENGE | OROMUCOSAL | Status: DC | PRN

## 2022-10-26 MED ORDER — EPINEPHRINE PF 1 MG/ML IJ SOLN
INTRAMUSCULAR | Status: DC | PRN
  Administered 2022-10-26: .15 mL

## 2022-10-26 MED ORDER — DEXTROSE 50 % IV SOLN
INTRAVENOUS | Status: DC | PRN
  Administered 2022-10-26: 12.5 g via INTRAVENOUS

## 2022-10-26 MED ORDER — ONDANSETRON HCL 4 MG/2ML IJ SOLN
INTRAMUSCULAR | Status: DC | PRN
  Administered 2022-10-26: 4 mg via INTRAVENOUS

## 2022-10-26 MED ORDER — MORPHINE SULFATE (PF) 2 MG/ML IV SOLN: 1.0000 mg | INTRAVENOUS | Status: DC | PRN

## 2022-10-26 MED ORDER — METHOCARBAMOL 500 MG PO TABS
ORAL_TABLET | ORAL | Status: AC
  Filled 2022-10-26: qty 1

## 2022-10-26 MED ORDER — NITROGLYCERIN 0.4 MG SL SUBL: 0.4000 mg | SUBLINGUAL_TABLET | SUBLINGUAL | Status: DC | PRN

## 2022-10-26 MED ORDER — METHYLPREDNISOLONE ACETATE 40 MG/ML IJ SUSP
INTRAMUSCULAR | Status: AC
  Filled 2022-10-26: qty 1

## 2022-10-26 MED ORDER — LIDOCAINE 2% (20 MG/ML) 5 ML SYRINGE
INTRAMUSCULAR | Status: AC
  Filled 2022-10-26: qty 5

## 2022-10-26 MED ORDER — METHOCARBAMOL 500 MG PO TABS
500.0000 mg | ORAL_TABLET | Freq: Four times a day (QID) | ORAL | Status: DC | PRN
  Administered 2022-10-26: 500 mg via ORAL

## 2022-10-26 MED ORDER — SODIUM CHLORIDE 0.9% FLUSH
3.0000 mL | Freq: Two times a day (BID) | INTRAVENOUS | Status: DC
  Administered 2022-10-26: 3 mL via INTRAVENOUS

## 2022-10-26 MED ORDER — METHOCARBAMOL 1000 MG/10ML IJ SOLN: 500.0000 mg | Freq: Four times a day (QID) | INTRAVENOUS | Status: DC | PRN

## 2022-10-26 MED ORDER — DAPAGLIFLOZIN PROPANEDIOL 10 MG PO TABS
10.0000 mg | ORAL_TABLET | Freq: Every day | ORAL | Status: DC
  Administered 2022-10-27: 10 mg via ORAL
  Filled 2022-10-26: qty 1

## 2022-10-26 MED ORDER — CEFAZOLIN SODIUM-DEXTROSE 2-4 GM/100ML-% IV SOLN
2.0000 g | INTRAVENOUS | Status: AC
  Administered 2022-10-26: 2 g via INTRAVENOUS
  Filled 2022-10-26: qty 100

## 2022-10-26 MED ORDER — DEXAMETHASONE SODIUM PHOSPHATE 10 MG/ML IJ SOLN
INTRAMUSCULAR | Status: AC
  Filled 2022-10-26: qty 1

## 2022-10-26 MED ORDER — ACETAMINOPHEN 650 MG RE SUPP: 650.0000 mg | RECTAL | Status: DC | PRN

## 2022-10-26 MED ORDER — DEXTROSE 50 % IV SOLN
12.5000 g | INTRAVENOUS | Status: AC
  Administered 2022-10-26: 12.5 g via INTRAVENOUS
  Filled 2022-10-26: qty 50

## 2022-10-26 MED ORDER — SODIUM CHLORIDE 0.9 % IV SOLN: 250.0000 mL | INTRAVENOUS | Status: DC

## 2022-10-26 MED ORDER — ROCURONIUM BROMIDE 10 MG/ML (PF) SYRINGE
PREFILLED_SYRINGE | INTRAVENOUS | Status: DC | PRN
  Administered 2022-10-26: 50 mg via INTRAVENOUS
  Administered 2022-10-26: 20 mg via INTRAVENOUS

## 2022-10-26 MED ORDER — LACTATED RINGERS IV SOLN: INTRAVENOUS | Status: DC

## 2022-10-26 MED ORDER — PHENYLEPHRINE 80 MCG/ML (10ML) SYRINGE FOR IV PUSH (FOR BLOOD PRESSURE SUPPORT)
PREFILLED_SYRINGE | INTRAVENOUS | Status: DC | PRN
  Administered 2022-10-26 (×2): 80 ug via INTRAVENOUS
  Administered 2022-10-26: 160 ug via INTRAVENOUS
  Administered 2022-10-26 (×3): 80 ug via INTRAVENOUS

## 2022-10-26 MED ORDER — BUPIVACAINE HCL 0.25 % IJ SOLN
INTRAMUSCULAR | Status: DC | PRN
  Administered 2022-10-26: 21 mL
  Administered 2022-10-26: 9 mL

## 2022-10-26 MED ORDER — BUPIVACAINE LIPOSOME 1.3 % IJ SUSP
INTRAMUSCULAR | Status: AC
  Filled 2022-10-26: qty 20

## 2022-10-26 MED ORDER — BUPIVACAINE LIPOSOME 1.3 % IJ SUSP
INTRAMUSCULAR | Status: DC | PRN
  Administered 2022-10-26: 20 mL

## 2022-10-26 MED ORDER — ROSUVASTATIN CALCIUM 20 MG PO TABS
20.0000 mg | ORAL_TABLET | Freq: Every day | ORAL | Status: DC
  Administered 2022-10-27: 20 mg via ORAL
  Filled 2022-10-26: qty 1

## 2022-10-26 MED ORDER — FENTANYL CITRATE (PF) 250 MCG/5ML IJ SOLN
INTRAMUSCULAR | Status: DC | PRN
  Administered 2022-10-26: 100 ug via INTRAVENOUS

## 2022-10-26 MED ORDER — THROMBIN 20000 UNITS EX SOLR: CUTANEOUS | Status: DC | PRN

## 2022-10-26 MED ORDER — DEXAMETHASONE SODIUM PHOSPHATE 10 MG/ML IJ SOLN
INTRAMUSCULAR | Status: DC | PRN
  Administered 2022-10-26: 4 mg via INTRAVENOUS

## 2022-10-26 MED ORDER — SODIUM CHLORIDE 0.9% FLUSH
3.0000 mL | INTRAVENOUS | Status: DC | PRN
  Administered 2022-10-26: 3 mL via INTRAVENOUS

## 2022-10-26 MED ORDER — INSULIN GLARGINE-YFGN 100 UNIT/ML ~~LOC~~ SOLN
100.0000 [IU] | SUBCUTANEOUS | Status: DC
  Filled 2022-10-26: qty 1

## 2022-10-26 MED ORDER — ONDANSETRON HCL 4 MG/2ML IJ SOLN: 4.0000 mg | Freq: Four times a day (QID) | INTRAMUSCULAR | Status: DC | PRN

## 2022-10-26 MED ORDER — FENTANYL CITRATE (PF) 250 MCG/5ML IJ SOLN
INTRAMUSCULAR | Status: AC
  Filled 2022-10-26: qty 5

## 2022-10-26 MED ORDER — SENNOSIDES-DOCUSATE SODIUM 8.6-50 MG PO TABS: 1.0000 | ORAL_TABLET | Freq: Every evening | ORAL | Status: DC | PRN

## 2022-10-26 MED ORDER — IRBESARTAN 75 MG PO TABS
75.0000 mg | ORAL_TABLET | Freq: Every day | ORAL | Status: DC
  Administered 2022-10-27: 75 mg via ORAL
  Filled 2022-10-26: qty 1

## 2022-10-26 MED ORDER — HYDRALAZINE HCL 25 MG PO TABS: 25.0000 mg | ORAL_TABLET | Freq: Every day | ORAL | Status: DC | PRN

## 2022-10-26 MED ORDER — PHENOL 1.4 % MT LIQD: 1.0000 | OROMUCOSAL | Status: DC | PRN

## 2022-10-26 MED ORDER — ONDANSETRON HCL 4 MG/2ML IJ SOLN
INTRAMUSCULAR | Status: AC
  Filled 2022-10-26: qty 2

## 2022-10-26 MED ORDER — METHYLPREDNISOLONE ACETATE 40 MG/ML IJ SUSP
INTRAMUSCULAR | Status: DC | PRN
  Administered 2022-10-26: 40 mg via INTRAMUSCULAR

## 2022-10-26 MED ORDER — CEFAZOLIN SODIUM-DEXTROSE 2-4 GM/100ML-% IV SOLN
2.0000 g | Freq: Three times a day (TID) | INTRAVENOUS | Status: AC
  Administered 2022-10-26 – 2022-10-27 (×2): 2 g via INTRAVENOUS
  Filled 2022-10-26 (×2): qty 100

## 2022-10-26 MED ORDER — METFORMIN HCL ER 500 MG PO TB24
1000.0000 mg | ORAL_TABLET | Freq: Two times a day (BID) | ORAL | Status: DC
  Administered 2022-10-27: 1000 mg via ORAL
  Filled 2022-10-26: qty 2

## 2022-10-26 MED ORDER — OXYCODONE HCL 5 MG PO TABS
ORAL_TABLET | ORAL | Status: AC
  Filled 2022-10-26: qty 1

## 2022-10-26 MED ORDER — 0.9 % SODIUM CHLORIDE (POUR BTL) OPTIME
TOPICAL | Status: DC | PRN
  Administered 2022-10-26: 1000 mL

## 2022-10-26 MED ORDER — ROCURONIUM BROMIDE 10 MG/ML (PF) SYRINGE
PREFILLED_SYRINGE | INTRAVENOUS | Status: AC
  Filled 2022-10-26: qty 10

## 2022-10-26 MED ORDER — EPINEPHRINE PF 1 MG/ML IJ SOLN
INTRAMUSCULAR | Status: AC
  Filled 2022-10-26: qty 1

## 2022-10-26 MED ORDER — PHENYLEPHRINE 80 MCG/ML (10ML) SYRINGE FOR IV PUSH (FOR BLOOD PRESSURE SUPPORT)
PREFILLED_SYRINGE | INTRAVENOUS | Status: AC
  Filled 2022-10-26: qty 10

## 2022-10-26 MED ORDER — MINERAL OIL LIGHT OIL
TOPICAL_OIL | Status: DC | PRN
  Administered 2022-10-26: 1 via TOPICAL

## 2022-10-26 MED ORDER — ORAL CARE MOUTH RINSE: 15.0000 mL | Freq: Once | OROMUCOSAL | Status: AC

## 2022-10-26 MED ORDER — ONDANSETRON HCL 4 MG PO TABS: 4.0000 mg | ORAL_TABLET | Freq: Four times a day (QID) | ORAL | Status: DC | PRN

## 2022-10-26 MED ORDER — METOPROLOL TARTRATE 25 MG PO TABS
25.0000 mg | ORAL_TABLET | Freq: Two times a day (BID) | ORAL | Status: DC
  Administered 2022-10-26 – 2022-10-27 (×2): 25 mg via ORAL
  Filled 2022-10-26 (×2): qty 1

## 2022-10-26 MED ORDER — FENTANYL CITRATE (PF) 100 MCG/2ML IJ SOLN
25.0000 ug | INTRAMUSCULAR | Status: DC | PRN
  Administered 2022-10-26 (×2): 25 ug via INTRAVENOUS

## 2022-10-26 MED ORDER — OXYCODONE HCL 5 MG/5ML PO SOLN: 5.0000 mg | Freq: Once | ORAL | Status: AC | PRN

## 2022-10-26 MED ORDER — POTASSIUM CHLORIDE IN NACL 20-0.9 MEQ/L-% IV SOLN: INTRAVENOUS | Status: DC

## 2022-10-26 MED ORDER — OXYCODONE-ACETAMINOPHEN 5-325 MG PO TABS: 1.0000 | ORAL_TABLET | ORAL | Status: DC | PRN

## 2022-10-26 MED ORDER — CHLORHEXIDINE GLUCONATE 0.12 % MT SOLN
15.0000 mL | Freq: Once | OROMUCOSAL | Status: AC
  Administered 2022-10-26: 15 mL via OROMUCOSAL
  Filled 2022-10-26: qty 15

## 2022-10-26 MED ORDER — POVIDONE-IODINE 7.5 % EX SOLN: Freq: Once | CUTANEOUS | Status: AC

## 2022-10-26 MED ORDER — FENTANYL CITRATE (PF) 100 MCG/2ML IJ SOLN
INTRAMUSCULAR | Status: AC
  Filled 2022-10-26: qty 2

## 2022-10-26 MED ORDER — BUPIVACAINE HCL (PF) 0.25 % IJ SOLN
INTRAMUSCULAR | Status: AC
  Filled 2022-10-26: qty 30

## 2022-10-26 MED ORDER — OXYCODONE HCL 5 MG PO TABS
5.0000 mg | ORAL_TABLET | Freq: Once | ORAL | Status: AC | PRN
  Administered 2022-10-26: 5 mg via ORAL

## 2022-10-26 MED ORDER — BISACODYL 5 MG PO TBEC: 5.0000 mg | DELAYED_RELEASE_TABLET | Freq: Every day | ORAL | Status: DC | PRN

## 2022-10-26 MED ORDER — HYDROCODONE-ACETAMINOPHEN 5-325 MG PO TABS: 1.0000 | ORAL_TABLET | ORAL | Status: DC | PRN

## 2022-10-26 MED ORDER — ACETAMINOPHEN 325 MG PO TABS: 650.0000 mg | ORAL_TABLET | ORAL | Status: DC | PRN

## 2022-10-26 MED ORDER — EZETIMIBE 10 MG PO TABS
10.0000 mg | ORAL_TABLET | Freq: Every day | ORAL | Status: DC
  Administered 2022-10-27: 10 mg via ORAL
  Filled 2022-10-26: qty 1

## 2022-10-26 MED ORDER — ALUM & MAG HYDROXIDE-SIMETH 200-200-20 MG/5ML PO SUSP: 30.0000 mL | Freq: Four times a day (QID) | ORAL | Status: DC | PRN

## 2022-10-26 MED ORDER — EPHEDRINE SULFATE-NACL 50-0.9 MG/10ML-% IV SOSY
PREFILLED_SYRINGE | INTRAVENOUS | Status: DC | PRN
  Administered 2022-10-26 (×5): 5 mg via INTRAVENOUS

## 2022-10-26 MED ORDER — DEXTROSE 50 % IV SOLN
INTRAVENOUS | Status: AC
  Administered 2022-10-26: 12.5 g via INTRAVENOUS
  Filled 2022-10-26: qty 50

## 2022-10-26 SURGICAL SUPPLY — 75 items
AGENT HMST KT MTR STRL THRMB (HEMOSTASIS) ×1
APL SKNCLS STERI-STRIP NONHPOA (GAUZE/BANDAGES/DRESSINGS) ×1
BAG COUNTER SPONGE SURGICOUNT (BAG) ×2 IMPLANT
BAG SPNG CNTER NS LX DISP (BAG) ×1
BENZOIN TINCTURE PRP APPL 2/3 (GAUZE/BANDAGES/DRESSINGS) IMPLANT
BNDG GAUZE DERMACEA FLUFF 4 (GAUZE/BANDAGES/DRESSINGS) ×2 IMPLANT
BNDG GZE DERMACEA 4 6PLY (GAUZE/BANDAGES/DRESSINGS)
BUR ROUND FLUTED 4 SOFT TCH (BURR) IMPLANT
BUR ROUND PRECISION 4.0 (BURR) ×2 IMPLANT
CABLE BIPOLOR RESECTION CORD (MISCELLANEOUS) ×2 IMPLANT
CANISTER SUCT 3000ML PPV (MISCELLANEOUS) ×2 IMPLANT
COVER SURGICAL LIGHT HANDLE (MISCELLANEOUS) ×2 IMPLANT
DRAIN CHANNEL 15F RND FF W/TCR (WOUND CARE) IMPLANT
DRAPE POUCH INSTRU U-SHP 10X18 (DRAPES) ×4 IMPLANT
DRAPE SURG 17X23 STRL (DRAPES) ×8 IMPLANT
DURAPREP 26ML APPLICATOR (WOUND CARE) ×2 IMPLANT
ELECT BLADE 4.0 EZ CLEAN MEGAD (MISCELLANEOUS) ×1
ELECT CAUTERY BLADE 6.4 (BLADE) ×2 IMPLANT
ELECT REM PT RETURN 9FT ADLT (ELECTROSURGICAL) ×1
ELECTRODE BLDE 4.0 EZ CLN MEGD (MISCELLANEOUS) ×2 IMPLANT
ELECTRODE REM PT RTRN 9FT ADLT (ELECTROSURGICAL) ×2 IMPLANT
EVACUATOR SILICONE 100CC (DRAIN) IMPLANT
FILTER STRAW FLUID ASPIR (MISCELLANEOUS) ×2 IMPLANT
GAUZE 4X4 16PLY ~~LOC~~+RFID DBL (SPONGE) ×4 IMPLANT
GAUZE SPONGE 4X4 12PLY STRL (GAUZE/BANDAGES/DRESSINGS) ×2 IMPLANT
GLOVE BIO SURGEON STRL SZ 6.5 (GLOVE) ×2 IMPLANT
GLOVE BIO SURGEON STRL SZ8 (GLOVE) ×2 IMPLANT
GLOVE BIOGEL PI IND STRL 7.0 (GLOVE) ×2 IMPLANT
GLOVE BIOGEL PI IND STRL 8 (GLOVE) ×2 IMPLANT
GLOVE SURG ENC MOIS LTX SZ6.5 (GLOVE) ×2 IMPLANT
GOWN STRL REUS W/ TWL LRG LVL3 (GOWN DISPOSABLE) ×2 IMPLANT
GOWN STRL REUS W/ TWL XL LVL3 (GOWN DISPOSABLE) ×4 IMPLANT
GOWN STRL REUS W/TWL LRG LVL3 (GOWN DISPOSABLE) ×1
GOWN STRL REUS W/TWL XL LVL3 (GOWN DISPOSABLE) ×2
IV CATH 14GX2 1/4 (CATHETERS) ×2 IMPLANT
KIT BASIN OR (CUSTOM PROCEDURE TRAY) ×2 IMPLANT
KIT POSITION SURG JACKSON T1 (MISCELLANEOUS) ×2 IMPLANT
KIT TURNOVER KIT B (KITS) ×2 IMPLANT
MARKER SKIN DUAL TIP RULER LAB (MISCELLANEOUS) ×2 IMPLANT
NDL 18GX1X1/2 (RX/OR ONLY) (NEEDLE) ×2 IMPLANT
NDL 22X1.5 STRL (OR ONLY) (MISCELLANEOUS) ×2 IMPLANT
NDL HYPO 18GX1.5 BLUNT FILL (NEEDLE) IMPLANT
NDL HYPO 25GX1X1/2 BEV (NEEDLE) ×2 IMPLANT
NDL SPNL 18GX3.5 QUINCKE PK (NEEDLE) ×4 IMPLANT
NEEDLE 18GX1X1/2 (RX/OR ONLY) (NEEDLE) ×1 IMPLANT
NEEDLE 22X1.5 STRL (OR ONLY) (MISCELLANEOUS) ×1 IMPLANT
NEEDLE HYPO 18GX1.5 BLUNT FILL (NEEDLE) ×1 IMPLANT
NEEDLE HYPO 25GX1X1/2 BEV (NEEDLE) ×1 IMPLANT
NEEDLE SPNL 18GX3.5 QUINCKE PK (NEEDLE) ×2 IMPLANT
NS IRRIG 1000ML POUR BTL (IV SOLUTION) ×2 IMPLANT
PACK LAMINECTOMY ORTHO (CUSTOM PROCEDURE TRAY) ×2 IMPLANT
PACK UNIVERSAL I (CUSTOM PROCEDURE TRAY) ×2 IMPLANT
PAD ARMBOARD 7.5X6 YLW CONV (MISCELLANEOUS) ×4 IMPLANT
PATTIES SURGICAL .5 X.5 (GAUZE/BANDAGES/DRESSINGS) IMPLANT
PATTIES SURGICAL .5 X1 (DISPOSABLE) ×2 IMPLANT
SPONGE INTESTINAL PEANUT (DISPOSABLE) ×2 IMPLANT
SPONGE SURGIFOAM ABS GEL SZ50 (HEMOSTASIS) ×2 IMPLANT
STRIP CLOSURE SKIN 1/2X4 (GAUZE/BANDAGES/DRESSINGS) IMPLANT
SURGIFLO W/THROMBIN 8M KIT (HEMOSTASIS) IMPLANT
SUT MNCRL AB 4-0 PS2 18 (SUTURE) ×2 IMPLANT
SUT VIC AB 0 CT1 18XCR BRD 8 (SUTURE) IMPLANT
SUT VIC AB 0 CT1 8-18 (SUTURE)
SUT VIC AB 1 CT1 18XCR BRD 8 (SUTURE) ×2 IMPLANT
SUT VIC AB 1 CT1 8-18 (SUTURE) ×1
SUT VIC AB 2-0 CT1 18 (SUTURE) IMPLANT
SUT VIC AB 2-0 CT2 18 VCP726D (SUTURE) ×2 IMPLANT
SYR 20ML LL LF (SYRINGE) ×2 IMPLANT
SYR BULB IRRIG 60ML STRL (SYRINGE) ×2 IMPLANT
SYR CONTROL 10ML LL (SYRINGE) ×4 IMPLANT
SYR TB 1ML LUER SLIP (SYRINGE) ×8 IMPLANT
TAPE CLOTH SURG 4X10 WHT LF (GAUZE/BANDAGES/DRESSINGS) IMPLANT
TOWEL GREEN STERILE (TOWEL DISPOSABLE) ×2 IMPLANT
TOWEL GREEN STERILE FF (TOWEL DISPOSABLE) ×2 IMPLANT
WATER STERILE IRR 1000ML POUR (IV SOLUTION) ×2 IMPLANT
YANKAUER SUCT BULB TIP NO VENT (SUCTIONS) ×2 IMPLANT

## 2022-10-26 NOTE — Progress Notes (Signed)
Dr. Alonna Buckler made aware of patient's CBG- 65 at 1130. Patient already receive 1/2 amp of Dextrose at 0945 for a blood sugar of 57. Ok to give another amp of Dextrose per Dr. Alonna Buckler. Will recheck CBG in 15 minutes per protocol.

## 2022-10-26 NOTE — Op Note (Signed)
PATIENT NAME: Mark Elliott   MEDICAL RECORD NO.:   [id number]   DATE OF BIRTH: 1936-03-20   DATE OF PROCEDURE: 10/26/2022                               OPERATIVE REPORT   PREOPERATIVE DIAGNOSES: 1.  Left-sided lumbar radiculopathy 2.  Spinal stenosis, L4-5, L5-S1  POSTOPERATIVE DIAGNOSES: 1.  Left-sided lumbar radiculopathy 2.  Spinal stenosis, L4-5, L5-S1  PROCEDURE:  L4-5, L5-S1 laminectomy with left-sided partial facetectomy and foraminotomy  SURGEON:  Estill Bamberg, MD.  ASSISTANTJason Coop, PA-C.  ANESTHESIA:  General endotracheal anesthesia.  COMPLICATIONS:  None.  DISPOSITION:  Stable.  ESTIMATED BLOOD LOSS:  Minimal.  INDICATIONS FOR SURGERY:  Briefly, Mark Elliott is a very pleasant 87- year-old male, who did present to me with severe pain in his left leg. The patient's MRI did reveal spinal stenosis at L4-5 and L5-S1.  Although I did not feel that his findings were acute in nature, and I did question whether the findings would explain the severe left leg pain he described, he did report excellent relief of his pain after an epidural injection, which did strongly suggest that the MRI findings were responsible for his pain.  Given his failure of nonoperative measures, we did discuss proceeding with a decompressive procedure.  He did wish to proceed. The patient was fully aware of the risks and limitations of surgery and did wish to proceed.  OPERATIVE DETAILS:  On 10/26/2022, the patient was brought to surgery and general endotracheal anesthesia was administered.  The patient was placed prone on a well-padded flat Jackson bed with a Wilson frame. Antibiotics were given and the back was prepped and draped in the usual sterile fashion.  A time-out procedure was performed.  I then made a midline incision overlying the L4-5 and L5-S1 intervertebral spaces.  The fascia was incised at the midline.  The paraspinal musculature was bluntly retracted laterally and  held retracted with a self-retaining retractor. After confirming the appropriate operative level, I did remove the spinous process of L4 and L5.  At this point, I proceeded with a partial facetectomy on the left.  This did result in excellent decompression of the left lateral recess at L5-S1.  I then turned my attention towards the foramen, and I did perform a thorough foraminotomy, in order to ensure thorough and complete decompression of the exiting left L5 nerve, and traversing left S1 nerve.  At the termination of this portion of the procedure, the left lateral recess and foramen were widely patent.   At this point, I turned my attention to the L4-5 level.  As previously described, I proceeded with a partial facetectomy on the left side.  This did result in excellent decompression of the left lateral recess at L4-L5.  I then turned my attention towards the foramen, and I did perform a thorough foraminotomy, in order to ensure thorough and complete decompression of the exiting left L4 nerve, and traversing left L5 nerve.  The L4-5 space, including the left lateral recess and foramen are widely patent.   The spinal canal was entirely decompressed across both the L4-5 and L5-S1 levels.  All bleeding was then adequately controlled.  At this point, 40 mg of Depo-Medrol was introduced about the epidural space.  Prior to this, the wound was copiously irrigated with a total of approximately 2 L of normal saline. Gelfoam was placed over the  laminectomy site.  I was very pleased with the decompression at both levels.  There was no extravasation of cerebrospinal fluid noted throughout the entire surgery.  At this point, the wound was closed in layers using #1 Vicryl, followed by 2-0 Vicryl, followed by 4- 0 Monocryl.  Benzoin and Steri-Strips were applied, followed by a sterile dressing.  All instrument counts were correct at the termination of the procedure.   Of note, Jason Coop, PA-C, was my  assistant throughout surgery, and did aid in retraction, suctioning, and closure from start to finish.   Estill Bamberg, MD

## 2022-10-26 NOTE — Anesthesia Postprocedure Evaluation (Signed)
Anesthesia Post Note  Patient: Mark Elliott  Procedure(s) Performed: LUMBAR 4- LUMBAR 5, LUMBAR 5- SACRUM 1 DECOMPRESSION (Spine Lumbar)     Patient location during evaluation: PACU Anesthesia Type: General Level of consciousness: awake and alert Pain management: pain level controlled Vital Signs Assessment: post-procedure vital signs reviewed and stable Respiratory status: spontaneous breathing, nonlabored ventilation, respiratory function stable and patient connected to nasal cannula oxygen Cardiovascular status: blood pressure returned to baseline and stable Postop Assessment: no apparent nausea or vomiting Anesthetic complications: no  No notable events documented.  Last Vitals:  Vitals:   10/26/22 1700 10/26/22 1715  BP: 120/67 119/64  Pulse: 72 75  Resp: 13 18  Temp:    SpO2: 96% 99%    Last Pain:  Vitals:   10/26/22 1615  TempSrc:   PainSc: 0-No pain                 Anel Purohit S

## 2022-10-26 NOTE — H&P (Signed)
PREOPERATIVE H&P  Chief Complaint: Left leg pain  HPI: Mark Elliott is a 87 y.o. male who presents with ongoing pain in the left leg  MRI reveals stenosis at L4/5 and L5/S1. Patient did report temporary improvement in his symptoms after an epidural injection.  Patient has failed multiple forms of conservative care and continues to have pain (see office notes for additional details regarding the patient's full course of treatment)  Past Medical History:  Diagnosis Date   Cancer    Prostate   Coronary artery disease    Diabetes mellitus without complication    Dyspnea    with exterion   Hyperlipidemia    Hypertension    Past Surgical History:  Procedure Laterality Date   ANTERIOR FUSION CERVICAL SPINE     CORONARY ANGIOGRAPHY N/A 09/30/2020   Procedure: CORONARY ANGIOGRAPHY;  Surgeon: Swaziland, Peter M, MD;  Location: [facility] INVASIVE CV LAB;  Service: Cardiovascular;  Laterality: N/A;   CORONARY ARTERY BYPASS GRAFT N/A 10/12/2020   Procedure: CORONARY ARTERY BYPASS GRAFTING (CABG) TIMES FOUR USING LEFT INTERNAL MAMMARY ARTERY AND RIGHT GREATER SAPHENOUS VEIN HARVESTED ENDOSCOPICALLY;  Surgeon: Linden Dolin, MD;  Location: [facility] OR;  Service: Open Heart Surgery;  Laterality: N/A;   CORONARY PRESSURE/FFR STUDY N/A 09/30/2020   Procedure: INTRAVASCULAR PRESSURE WIRE/FFR STUDY;  Surgeon: Swaziland, Peter M, MD;  Location: [facility] INVASIVE CV LAB;  Service: Cardiovascular;  Laterality: N/A;   HERNIA REPAIR     LEFT HEART CATH AND CORONARY ANGIOGRAPHY N/A 11/15/2016   Procedure: Left Heart Cath and Coronary Angiography;  Surgeon: Yates Decamp, MD;  Location: [facility] INVASIVE CV LAB;  Service: Cardiovascular;  Laterality: N/A;   LEFT HEART CATH AND CORONARY ANGIOGRAPHY N/A 08/18/2020   Procedure: LEFT HEART CATH AND CORONARY ANGIOGRAPHY;  Surgeon: Yates Decamp, MD;  Location: [facility] INVASIVE CV LAB;  Service: Cardiovascular;  Laterality: N/A;   PROSTATE ABLATION     ROBOT ASSISTED LAPAROSCOPIC  RADICAL PROSTATECTOMY  06/13/2007   TEE WITHOUT CARDIOVERSION N/A 10/12/2020   Procedure: TRANSESOPHAGEAL ECHOCARDIOGRAM (TEE);  Surgeon: Linden Dolin, MD;  Location: [facility] OR;  Service: Open Heart Surgery;  Laterality: N/A;   TONSILLECTOMY     removed when pt was in high school   Social History   Socioeconomic History   Marital status: Married    Spouse name: Not on file   Number of children: 2   Years of education: Not on file   Highest education level: Not on file  Occupational History   Not on file  Tobacco Use   Smoking status: Former    Years: 5    Types: Cigarettes    Quit date: 02/13/1974    Years since quitting: 48.7   Smokeless tobacco: Never  Vaping Use   Vaping Use: Never used  Substance and Sexual Activity   Alcohol use: No   Drug use: No   Sexual activity: Not on file  Other Topics Concern   Not on file  Social History Narrative   Not on file   Social Determinants of Health   Financial Resource Strain: Not on file  Food Insecurity: Not on file  Transportation Needs: Not on file  Physical Activity: Not on file  Stress: Not on file  Social Connections: Not on file   Family History  Problem Relation Age of Onset   Cancer Father        liver   Cancer Brother        pancreatic  No Known Allergies Prior to Admission medications   Medication Sig Start Date End Date Taking? Authorizing Provider  acetaminophen (TYLENOL) 650 MG CR tablet Take 650 mg by mouth every 8 (eight) hours as needed for pain.   Yes [provider]  aspirin EC 81 MG tablet Take 81 mg by mouth daily. Swallow whole.   Yes [provider]  dapagliflozin propanediol (FARXIGA) 10 MG TABS tablet Take 1 tablet (10 mg total) by mouth daily before breakfast. 08/24/22  Yes Yates Decamp, MD  Dulaglutide 3 MG/0.5ML SOPN Inject 3 mg into the skin every 7 (seven) days. 03/15/22  Yes [provider]  ezetimibe (ZETIA) 10 MG tablet Take 1 tablet (10 mg total) by mouth daily.  03/24/22 03/24/23 Yes Custovic, Rozell Searing, DO  hydrALAZINE (APRESOLINE) 25 MG tablet Take 25 mg by mouth daily as needed (blood pressure of 150 or above).   Yes [provider]  LANTUS SOLOSTAR 100 UNIT/ML Solostar Pen Inject 24 Units into the skin daily before supper. 01/11/16  Yes [provider]  metFORMIN (GLUCOPHAGE-XR) 500 MG 24 hr tablet Take 2 tablets (1,000 mg total) by mouth 2 (two) times daily. 10/02/20  Yes Swaziland, Peter M, MD  metoprolol tartrate (LOPRESSOR) 25 MG tablet Take 1 tablet (25 mg total) by mouth 2 (two) times daily. Patient taking differently: Take 12.5 mg by mouth 2 (two) times daily. 12/29/20  Yes Yates Decamp, MD  nitroGLYCERIN (NITROSTAT) 0.4 MG SL tablet Place 1 tablet (0.4 mg total) under the tongue every 5 (five) minutes as needed for chest pain. 05/03/21 10/14/22 Yes Cantwell, Celeste C, PA-C  rosuvastatin (CRESTOR) 20 MG tablet Take 1 tablet (20 mg total) by mouth daily. 09/16/22 09/11/23 Yes Yates Decamp, MD  traMADol (ULTRAM) 50 MG tablet Take 50-100 mg by mouth 3 (three) times daily as needed for severe pain.   Yes [provider]  ACCU-CHEK AVIVA PLUS test strip Inject 1 application as directed 2 (two) times daily. 12/01/15   [provider]  aspirin EC 325 MG EC tablet Take 1 tablet (325 mg total) by mouth daily. Patient not taking: Reported on 10/14/2022 10/17/20   Gershon Crane E, PA-C  hydrALAZINE (APRESOLINE) 50 MG tablet Take 0.5 tablets (25 mg total) by mouth 3 (three) times daily. Patient not taking: Reported on 10/14/2022 04/22/22 12/18/22  Nori Riis, NP  olmesartan (BENICAR) 20 MG tablet Take 0.5 tablets (10 mg total) by mouth daily. Patient not taking: Reported on 10/14/2022 08/18/22   Yates Decamp, MD     All other systems have been reviewed and were otherwise negative with the exception of those mentioned in the HPI and as above.  Physical Exam: There were no vitals filed for this visit.  There is no height or weight on file to  calculate BMI.  General: Alert, no acute distress Cardiovascular: No pedal edema Respiratory: No cyanosis, no use of accessory musculature Skin: No lesions in the area of chief complaint Neurologic: Sensation intact distally Psychiatric: Patient is competent for consent with normal mood and affect Lymphatic: No axillary or cervical lymphadenopathy   Assessment/Plan: L4-L5 and L5-S1 stenosis resulting in left leg pain Plan for Procedure(s): LUMBAR 4- LUMBAR 5, LUMBAR 5- SACRUM 1 DECOMPRESSION   Jackelyn Hoehn, MD 10/26/2022 7:13 AM

## 2022-10-26 NOTE — Anesthesia Procedure Notes (Addendum)
Procedure Name: Intubation Date/Time: 10/26/2022 12:49 PM  Performed by: Shary Decamp, CRNAPre-anesthesia Checklist: Patient identified, Patient being monitored, Timeout performed, Emergency Drugs available and Suction available Patient Re-evaluated:Patient Re-evaluated prior to induction Oxygen Delivery Method: Circle system utilized Preoxygenation: Pre-oxygenation with 100% oxygen Induction Type: IV induction Ventilation: Mask ventilation without difficulty and Oral airway inserted - appropriate to patient size Laryngoscope Size: Mac and 4 Grade View: Grade II Tube type: Oral Tube size: 7.5 mm Number of attempts: 1 Airway Equipment and Method: Stylet Placement Confirmation: ETT inserted through vocal cords under direct vision, positive ETCO2 and breath sounds checked- equal and bilateral Secured at: 23 cm Tube secured with: Tape Dental Injury: Teeth and Oropharynx as per pre-operative assessment

## 2022-10-26 NOTE — Transfer of Care (Signed)
Immediate Anesthesia Transfer of Care Note  Patient: Mark Elliott  Procedure(s) Performed: LUMBAR 4- LUMBAR 5, LUMBAR 5- SACRUM 1 DECOMPRESSION (Spine Lumbar)  Patient Location: PACU  Anesthesia Type:General  Level of Consciousness: drowsy  Airway & Oxygen Therapy: Patient Spontanous Breathing and Patient connected to face mask oxygen  Post-op Assessment: Report given to RN and Post -op Vital signs reviewed and stable  Post vital signs: Reviewed and stable  Last Vitals:  Vitals Value Taken Time  BP 126/60 10/26/22 1609  Temp    Pulse 72 10/26/22 1615  Resp 24 10/26/22 1615  SpO2 100 % 10/26/22 1615  Vitals shown include unvalidated device data.  Last Pain:  Vitals:   10/26/22 0947  TempSrc:   PainSc: 0-No pain      Patients Stated Pain Goal: 0 (10/26/22 0947)  Complications: No notable events documented.

## 2022-10-26 NOTE — Plan of Care (Signed)

## 2022-10-27 ENCOUNTER — Encounter (HOSPITAL_COMMUNITY): Payer: Self-pay | Admitting: Orthopedic Surgery

## 2022-10-27 DIAGNOSIS — Z7982 Long term (current) use of aspirin: Secondary | ICD-10-CM | POA: Diagnosis not present

## 2022-10-27 DIAGNOSIS — M48061 Spinal stenosis, lumbar region without neurogenic claudication: Secondary | ICD-10-CM | POA: Diagnosis not present

## 2022-10-27 DIAGNOSIS — Z951 Presence of aortocoronary bypass graft: Secondary | ICD-10-CM | POA: Diagnosis not present

## 2022-10-27 DIAGNOSIS — I10 Essential (primary) hypertension: Secondary | ICD-10-CM | POA: Diagnosis not present

## 2022-10-27 DIAGNOSIS — I2510 Atherosclerotic heart disease of native coronary artery without angina pectoris: Secondary | ICD-10-CM | POA: Diagnosis not present

## 2022-10-27 DIAGNOSIS — Z87891 Personal history of nicotine dependence: Secondary | ICD-10-CM | POA: Diagnosis not present

## 2022-10-27 DIAGNOSIS — E119 Type 2 diabetes mellitus without complications: Secondary | ICD-10-CM | POA: Diagnosis not present

## 2022-10-27 DIAGNOSIS — Z7984 Long term (current) use of oral hypoglycemic drugs: Secondary | ICD-10-CM | POA: Diagnosis not present

## 2022-10-27 DIAGNOSIS — M5416 Radiculopathy, lumbar region: Secondary | ICD-10-CM | POA: Diagnosis not present

## 2022-10-27 MED ORDER — METHOCARBAMOL 500 MG PO TABS: 500.0000 mg | ORAL_TABLET | Freq: Four times a day (QID) | ORAL | 2 refills | Status: DC | PRN

## 2022-10-27 MED ORDER — OXYCODONE-ACETAMINOPHEN 5-325 MG PO TABS: 1.0000 | ORAL_TABLET | ORAL | 0 refills | Status: DC | PRN

## 2022-10-27 NOTE — Progress Notes (Signed)
OT Cancellation Note  Patient Details Name: Mark Elliott MRN: [id number] DOB: [birth year]   Cancelled Treatment:    Reason Eval/Treat Not Completed: OT screened, no needs identified, will sign off  Emelda Fear 10/27/2022, 9:35 AM  Nyoka Cowden OTR/L Acute Rehabilitation Services Office: [phone]

## 2022-10-27 NOTE — Discharge Summary (Signed)
Patient ID: ZAIM PASQUINO MRN: [id number] DOB/AGE: [birth year] 87 y.o.  Admit date: 10/26/2022 Discharge date: 10/27/2022  Admission Diagnoses:  Principal Problem:   Radiculopathy, lumbar region   Discharge Diagnoses:  Same  Past Medical History:  Diagnosis Date   Cancer    Prostate   Coronary artery disease    Diabetes mellitus without complication    Dyspnea    with exterion   Hyperlipidemia    Hypertension     Surgeries: Procedure(s): LUMBAR 4- LUMBAR 5, LUMBAR 5- SACRUM 1 DECOMPRESSION on 10/26/2022   Consultants: None  Discharged Condition: Improved  Hospital Course: Mark Elliott is an 87 y.o. male who was admitted 10/26/2022 for operative treatment of Radiculopathy, lumbar region. Patient has severe unremitting pain that affects sleep, daily activities, and work/hobbies. After pre-op clearance the patient was taken to the operating room on 10/26/2022 and underwent  Procedure(s): LUMBAR 4- LUMBAR 5, LUMBAR 5- SACRUM 1 DECOMPRESSION.    Patient was given perioperative antibiotics:  Anti-infectives (From admission, onward)    Start     Dose/Rate Route Frequency Ordered Stop   10/26/22 1845  ceFAZolin (ANCEF) IVPB 2g/100 mL premix        2 g 200 mL/hr over 30 Minutes Intravenous Every 8 hours 10/26/22 1750 10/27/22 0224   10/26/22 1000  ceFAZolin (ANCEF) IVPB 2g/100 mL premix        2 g 200 mL/hr over 30 Minutes Intravenous On call to O.R. 10/26/22 0945 10/26/22 1304        Patient was given sequential compression devices, early ambulation to prevent DVT.  Patient benefited maximally from hospital stay and there were no complications.    Recent vital signs: Patient Vitals for the past 24 hrs:  BP Temp Temp src Pulse Resp SpO2  10/27/22 0311 115/76 98.7 F (37.1 C) Oral 73 16 97 %  10/26/22 2303 (!) 124/55 98.8 F (37.1 C) Oral 88 20 97 %  10/26/22 1957 (!) 111/57 98.3 F (36.8 C) Oral 82 16 97 %  10/26/22 1800 127/76 -- -- 71 -- 98 %   10/26/22 1730 132/60 -- -- 69 18 98 %  10/26/22 1715 119/64 97.6 F (36.4 C) -- 75 18 99 %  10/26/22 1700 120/67 -- -- 72 13 96 %  10/26/22 1645 (!) 107/56 -- -- 73 13 97 %  10/26/22 1615 -- -- -- -- 16 --  10/26/22 1611 126/60 97.6 F (36.4 C) -- 74 16 100 %     Discharge Medications:   Allergies as of 10/27/2022   No Known Allergies      Medication List     TAKE these medications    Accu-Chek Aviva Plus test strip Generic drug: glucose blood Inject 1 application as directed 2 (two) times daily.   acetaminophen 650 MG CR tablet Commonly known as: TYLENOL Take 650 mg by mouth every 8 (eight) hours as needed for pain.   dapagliflozin propanediol 10 MG Tabs tablet Commonly known as: Farxiga Take 1 tablet (10 mg total) by mouth daily before breakfast.   Dulaglutide 3 MG/0.5ML Sopn Inject 3 mg into the skin every 7 (seven) days.   ezetimibe 10 MG tablet Commonly known as: Zetia Take 1 tablet (10 mg total) by mouth daily.   hydrALAZINE 25 MG tablet Commonly known as: APRESOLINE Take 25 mg by mouth daily as needed (blood pressure of 150 or above).   hydrALAZINE 50 MG tablet Commonly known as: APRESOLINE Take 0.5 tablets (25 mg total)  by mouth 3 (three) times daily.   Lantus SoloStar 100 UNIT/ML Solostar Pen Generic drug: insulin glargine Inject 24 Units into the skin daily before supper.   metFORMIN 500 MG 24 hr tablet Commonly known as: GLUCOPHAGE-XR Take 2 tablets (1,000 mg total) by mouth 2 (two) times daily.   methocarbamol 500 MG tablet Commonly known as: ROBAXIN Take 1 tablet (500 mg total) by mouth every 6 (six) hours as needed for muscle spasms.   metoprolol tartrate 25 MG tablet Commonly known as: LOPRESSOR Take 1 tablet (25 mg total) by mouth 2 (two) times daily. What changed: how much to take   nitroGLYCERIN 0.4 MG SL tablet Commonly known as: NITROSTAT Place 1 tablet (0.4 mg total) under the tongue every 5 (five) minutes as needed for chest  pain.   olmesartan 20 MG tablet Commonly known as: BENICAR Take 0.5 tablets (10 mg total) by mouth daily.   oxyCODONE-acetaminophen 5-325 MG tablet Commonly known as: PERCOCET/ROXICET Take 1-2 tablets by mouth every 4 (four) hours as needed for severe pain.   rosuvastatin 20 MG tablet Commonly known as: CRESTOR Take 1 tablet (20 mg total) by mouth daily.   traMADol 50 MG tablet Commonly known as: ULTRAM Take 50-100 mg by mouth 3 (three) times daily as needed for severe pain.        Diagnostic Studies: DG Lumbar Spine 2-3 Views  Result Date: 10/26/2022 CLINICAL DATA:  Lumbar decompression EXAM: LUMBAR SPINE - 3 VIEW COMPARISON:  04/19/2022 MRI lumbar spine FINDINGS: Three cross-table lateral images are provided. These demonstrate instrumentation posterior to the L3-L4 disc space and L5 vertebral body on the first image, and instrumentation posterior to the L4-L5 disc space on the second and third images, given the transitional anatomy described on the prior MRI. IMPRESSION: Identification of the L3-L4 and L4-L5 levels, given previously noted transitional anatomy. Electronically Signed   By: Wiliam Ke M.D.   On: 10/26/2022 16:36   DG C-Arm 1-60 Min-No Report  Result Date: 10/26/2022 Fluoroscopy was utilized by the requesting physician.  No radiographic interpretation.   DG C-Arm 1-60 Min-No Report  Result Date: 10/26/2022 Fluoroscopy was utilized by the requesting physician.  No radiographic interpretation.   DG C-Arm 1-60 Min-No Report  Result Date: 10/26/2022 Fluoroscopy was utilized by the requesting physician.  No radiographic interpretation.    Disposition: Discharge disposition: 01-Home or Self Care        POD #1 s/p L4-S1 decompression, doing well   - up with PT/OT, encourage ambulation - Percocet for pain if needed, Robaxin for muscle spasms -Scripts for pain sent to pharmacy electronically  -D/C instructions sheet printed and in chart -D/C today  -F/U  in office 2 weeks   Signed: Eilene Ghazi Yuchen Fedor 10/27/2022, 11:51 AM

## 2022-10-27 NOTE — Progress Notes (Signed)
Explained discharge instructions to patient. Reviewed follow up appointment and next medication administration times. Also reviewed education. Patient verbalized having an understanding for instructions given. All belongings are in the patient's possession. IV was removed by patient's nurse. No other needs verbalized. Transported downstairs for discharge. 

## 2022-10-27 NOTE — Evaluation (Signed)
Physical Therapy Evaluation Patient Details Name: Mark Elliott MRN: [id number] DOB: May 25, 1936 Today's Date: 10/27/2022  History of Present Illness  The pt is an 87 yo male presenting 4/17 for L4-5, L5-S1 laminectomy and L-sided facetectomy and foraminotomy due to chronic L leg pain. PMH includes: prostate cancer, CAD, DM II, HLD, HTN, CABG x4 in 2022, and dyspnea on exertion.   Clinical Impression  Pt in bed upon arrival of PT, agreeable to evaluation at this time. Prior to admission the pt was independent with mobility and ADLs, but progressively limited by LLE pain. The pt now presents with minor limitations in functional mobility, endurance, and power in LE, but is safe to return home when medically cleared. He was able to complete hallway ambulation with no DME, no reports of pain, and supervision for safety only. He completed single step to mimic home environment with supervision and use of single rail. We then discussed safety with mobility at home, progressive walking program, and spinal precautions. No further acute PT needs.    Recommendations for follow up therapy are one component of a multi-disciplinary discharge planning process, led by the attending physician.  Recommendations may be updated based on patient status, additional functional criteria and insurance authorization.  Follow Up Recommendations       Assistance Recommended at Discharge Intermittent Supervision/Assistance  Patient can return home with the following       Equipment Recommendations None recommended by PT  Recommendations for Other Services       Functional Status Assessment Patient has had a recent decline in their functional status and demonstrates the ability to make significant improvements in function in a reasonable and predictable amount of time.     Precautions / Restrictions Precautions Precautions: Fall;Back Precaution Booklet Issued: Yes (comment) Required Braces or Orthoses: Spinal  Brace Spinal Brace: Lumbar corset;Applied in sitting position Restrictions Weight Bearing Restrictions: No      Mobility  Bed Mobility               General bed mobility comments: pt sitting EOB at start and end of session, discussed verbally with pt and wife    Transfers Overall transfer level: Needs assistance Equipment used: 1 person hand held assist Transfers: Sit to/from Stand Sit to Stand: Min assist           General transfer comment: minA to rise from low bed, discussed technique and pt then able to complete with supervision. no LOB    Ambulation/Gait Ambulation/Gait assistance: Supervision Gait Distance (Feet): 250 Feet Assistive device: None Gait Pattern/deviations: WFL(Within Functional Limits) Gait velocity: decreased but safe Gait velocity interpretation: 1.31 - 2.62 ft/sec, indicative of limited community ambulator   General Gait Details: supervision during session, pt walking independently in room after session without instability  Stairs Stairs: Yes Stairs assistance: Supervision Stair Management: One rail Left, Step to pattern, Forwards Number of Stairs: 1 General stair comments: RLE leading due to LLE pain     Balance Overall balance assessment: No apparent balance deficits (not formally assessed)                                           Pertinent Vitals/Pain Pain Assessment Pain Assessment: Faces Faces Pain Scale: Hurts little more Pain Location: L hip Pain Descriptors / Indicators: Discomfort Pain Intervention(s): Monitored during session, Limited activity within patient's tolerance    Home Living Family/patient  expects to be discharged to:: Private residence Living Arrangements: Spouse/significant other Available Help at Discharge: Family;Available 24 hours/day Type of Home: House Home Access: Stairs to enter Entrance Stairs-Rails: None Entrance Stairs-Number of Steps: 1   Home Layout: One level Home  Equipment: Agricultural consultant (2 wheels);Cane - single point;Shower seat;Toilet riser (lift chair)      Prior Function Prior Level of Function : Independent/Modified Independent;Driving             Mobility Comments: no DME even with pain, no falls ADLs Comments: pt had just retired, enjoys traveling. independent     Hand Dominance   Dominant Hand: Right    Extremity/Trunk Assessment   Upper Extremity Assessment Upper Extremity Assessment: Overall WFL for tasks assessed    Lower Extremity Assessment Lower Extremity Assessment: Overall WFL for tasks assessed    Cervical / Trunk Assessment Cervical / Trunk Assessment: Back Surgery  Communication   Communication: No difficulties  Cognition Arousal/Alertness: Awake/alert Behavior During Therapy: WFL for tasks assessed/performed Overall Cognitive Status: Within Functional Limits for tasks assessed                                          General Comments General comments (skin integrity, edema, etc.): VSS on RA, wife present and supportive    Exercises     Assessment/Plan    PT Assessment All further PT needs can be met in the next venue of care  PT Problem List Decreased strength;Decreased mobility;Pain       PT Treatment Interventions      PT Goals (Current goals can be found in the Care Plan section)  Acute Rehab PT Goals Patient Stated Goal: return to gym PT Goal Formulation: All assessment and education complete, DC therapy Time For Goal Achievement: 11/03/22 Potential to Achieve Goals: Good     AM-PAC PT "6 Clicks" Mobility  Outcome Measure Help needed turning from your back to your side while in a flat bed without using bedrails?: A Little Help needed moving from lying on your back to sitting on the side of a flat bed without using bedrails?: A Little Help needed moving to and from a bed to a chair (including a wheelchair)?: None Help needed standing up from a chair using your arms  (e.g., wheelchair or bedside chair)?: None Help needed to walk in hospital room?: None Help needed climbing 3-5 steps with a railing? : A Little 6 Click Score: 21    End of Session Equipment Utilized During Treatment: Back brace Activity Tolerance: Patient tolerated treatment well Patient left: in bed;with call bell/phone within reach;with family/visitor present (sitting EOB) Nurse Communication: Mobility status PT Visit Diagnosis: Unsteadiness on feet (R26.81);Pain Pain - part of body:  (back)    Time: 8119-1478 PT Time Calculation (min) (ACUTE ONLY): 23 min   Charges:   PT Evaluation $PT Eval Low Complexity: 1 Low PT Treatments $Self Care/Home Management: 8-22        Vickki Muff, PT, DPT   Acute Rehabilitation Department Office [phone] Secure Chat Communication Preferred  Ronnie Derby 10/27/2022, 8:49 AM

## 2022-10-27 NOTE — Plan of Care (Signed)

## 2022-10-27 NOTE — Progress Notes (Signed)
    Patient doing well  Denies leg pain or back pain   Physical Exam: Vitals:   10/26/22 2303 10/27/22 0311  BP: (!) 124/55 115/76  Pulse: 88 73  Resp: 20 16  Temp: 98.8 F (37.1 C) 98.7 F (37.1 C)  SpO2: 97% 97%    Dressing in place NVI  POD #1 s/p L4-S1 decompression, doing well  - up with PT/OT, encourage ambulation - Percocet for pain if needed, Robaxin for muscle spasms - d/c home today with f/u in 2 weeks

## 2022-10-31 DIAGNOSIS — E1169 Type 2 diabetes mellitus with other specified complication: Secondary | ICD-10-CM | POA: Diagnosis not present

## 2022-10-31 DIAGNOSIS — I10 Essential (primary) hypertension: Secondary | ICD-10-CM | POA: Diagnosis not present

## 2022-10-31 DIAGNOSIS — E7800 Pure hypercholesterolemia, unspecified: Secondary | ICD-10-CM | POA: Diagnosis not present

## 2022-10-31 DIAGNOSIS — I25110 Atherosclerotic heart disease of native coronary artery with unstable angina pectoris: Secondary | ICD-10-CM | POA: Diagnosis not present

## 2022-11-10 DIAGNOSIS — E119 Type 2 diabetes mellitus without complications: Secondary | ICD-10-CM | POA: Diagnosis not present

## 2022-11-14 DIAGNOSIS — I10 Essential (primary) hypertension: Secondary | ICD-10-CM | POA: Diagnosis not present

## 2022-11-22 ENCOUNTER — Other Ambulatory Visit: Payer: Self-pay | Admitting: Cardiology

## 2022-11-22 DIAGNOSIS — E119 Type 2 diabetes mellitus without complications: Secondary | ICD-10-CM

## 2022-12-11 DIAGNOSIS — E119 Type 2 diabetes mellitus without complications: Secondary | ICD-10-CM | POA: Diagnosis not present

## 2022-12-14 DIAGNOSIS — I10 Essential (primary) hypertension: Secondary | ICD-10-CM | POA: Diagnosis not present

## 2023-01-10 DIAGNOSIS — E119 Type 2 diabetes mellitus without complications: Secondary | ICD-10-CM | POA: Diagnosis not present

## 2023-02-09 DIAGNOSIS — E119 Type 2 diabetes mellitus without complications: Secondary | ICD-10-CM | POA: Diagnosis not present

## 2023-03-15 ENCOUNTER — Other Ambulatory Visit: Payer: Self-pay

## 2023-03-17 ENCOUNTER — Other Ambulatory Visit: Payer: Self-pay

## 2023-03-17 MED ORDER — OLMESARTAN MEDOXOMIL 20 MG PO TABS: 10.0000 mg | ORAL_TABLET | Freq: Every day | ORAL | 1 refills | Status: DC

## 2023-03-21 ENCOUNTER — Other Ambulatory Visit: Payer: Self-pay | Admitting: Internal Medicine

## 2023-03-29 NOTE — Telephone Encounter (Signed)
This encounter was created in error - please disregard.

## 2023-04-06 ENCOUNTER — Ambulatory Visit: Payer: Medicare Other | Admitting: Cardiology

## 2023-04-28 DIAGNOSIS — M5416 Radiculopathy, lumbar region: Secondary | ICD-10-CM | POA: Diagnosis not present

## 2023-05-01 ENCOUNTER — Other Ambulatory Visit: Payer: Self-pay | Admitting: Orthopedic Surgery

## 2023-05-01 DIAGNOSIS — M545 Low back pain, unspecified: Secondary | ICD-10-CM

## 2023-05-06 DIAGNOSIS — E119 Type 2 diabetes mellitus without complications: Secondary | ICD-10-CM | POA: Diagnosis not present

## 2023-05-10 ENCOUNTER — Ambulatory Visit
Admission: RE | Admit: 2023-05-10 | Discharge: 2023-05-10 | Disposition: A | Discharge status: Home / Self Care | Payer: Medicare Other | Source: Ambulatory Visit | Attending: Orthopedic Surgery | Admitting: Orthopedic Surgery

## 2023-05-10 DIAGNOSIS — M48061 Spinal stenosis, lumbar region without neurogenic claudication: Secondary | ICD-10-CM | POA: Diagnosis not present

## 2023-05-10 DIAGNOSIS — M545 Low back pain, unspecified: Secondary | ICD-10-CM

## 2023-05-19 ENCOUNTER — Other Ambulatory Visit: Payer: Medicare Other

## 2023-05-19 DIAGNOSIS — M5416 Radiculopathy, lumbar region: Secondary | ICD-10-CM | POA: Diagnosis not present

## 2023-05-25 DIAGNOSIS — M5416 Radiculopathy, lumbar region: Secondary | ICD-10-CM | POA: Diagnosis not present

## 2023-06-22 ENCOUNTER — Other Ambulatory Visit: Payer: Self-pay

## 2023-06-22 MED ORDER — ROSUVASTATIN CALCIUM 20 MG PO TABS: 20.0000 mg | ORAL_TABLET | Freq: Every day | ORAL | 0 refills | Status: DC

## 2023-06-26 DIAGNOSIS — Z794 Long term (current) use of insulin: Secondary | ICD-10-CM | POA: Diagnosis not present

## 2023-06-26 DIAGNOSIS — E7800 Pure hypercholesterolemia, unspecified: Secondary | ICD-10-CM | POA: Diagnosis not present

## 2023-06-26 DIAGNOSIS — Z23 Encounter for immunization: Secondary | ICD-10-CM | POA: Diagnosis not present

## 2023-06-26 DIAGNOSIS — I10 Essential (primary) hypertension: Secondary | ICD-10-CM | POA: Diagnosis not present

## 2023-06-26 DIAGNOSIS — I25118 Atherosclerotic heart disease of native coronary artery with other forms of angina pectoris: Secondary | ICD-10-CM | POA: Diagnosis not present

## 2023-06-26 DIAGNOSIS — N1831 Chronic kidney disease, stage 3a: Secondary | ICD-10-CM | POA: Diagnosis not present

## 2023-06-26 DIAGNOSIS — Z0000 Encounter for general adult medical examination without abnormal findings: Secondary | ICD-10-CM | POA: Diagnosis not present

## 2023-06-26 DIAGNOSIS — E1165 Type 2 diabetes mellitus with hyperglycemia: Secondary | ICD-10-CM | POA: Diagnosis not present

## 2023-07-28 DIAGNOSIS — E119 Type 2 diabetes mellitus without complications: Secondary | ICD-10-CM | POA: Diagnosis not present

## 2023-09-06 ENCOUNTER — Other Ambulatory Visit (HOSPITAL_COMMUNITY): Payer: Self-pay

## 2023-09-06 ENCOUNTER — Other Ambulatory Visit: Payer: Self-pay

## 2023-09-06 ENCOUNTER — Telehealth: Payer: Self-pay | Admitting: Cardiology

## 2023-09-06 MED ORDER — OLMESARTAN MEDOXOMIL 20 MG PO TABS: 10.0000 mg | ORAL_TABLET | Freq: Every day | ORAL | 0 refills | Status: DC

## 2023-09-06 MED ORDER — OLMESARTAN MEDOXOMIL 20 MG PO TABS
10.0000 mg | ORAL_TABLET | Freq: Every day | ORAL | 0 refills | Status: DC
  Filled 2023-09-06: qty 30, 60d supply, fill #0

## 2023-09-06 NOTE — Telephone Encounter (Signed)
*  STAT* If patient is at the pharmacy, call can be transferred to refill team.   1. Which medications need to be refilled? (please list name of each medication and dose if known)  olmesartan (BENICAR) 20 MG tablet  2. Which pharmacy/location (including street and city if local pharmacy) is medication to be sent to? [facility] [id number] - , [place] - [address]  3. Do they need a 30 day or 90 day supply?  90 day supply

## 2023-09-11 ENCOUNTER — Encounter: Payer: Self-pay | Admitting: Cardiology

## 2023-09-11 ENCOUNTER — Ambulatory Visit: Payer: Medicare Other | Attending: Cardiology | Admitting: Cardiology

## 2023-09-11 VITALS — BP 126/60 | HR 67 | Resp 16 | Ht 66.0 in | Wt 209.0 lb

## 2023-09-11 DIAGNOSIS — I10 Essential (primary) hypertension: Secondary | ICD-10-CM

## 2023-09-11 DIAGNOSIS — I25118 Atherosclerotic heart disease of native coronary artery with other forms of angina pectoris: Secondary | ICD-10-CM

## 2023-09-11 DIAGNOSIS — E7800 Pure hypercholesterolemia, unspecified: Secondary | ICD-10-CM | POA: Diagnosis not present

## 2023-09-11 MED ORDER — EZETIMIBE 10 MG PO TABS
10.0000 mg | ORAL_TABLET | Freq: Every day | ORAL | 3 refills | Status: AC
Start: 2023-09-11 — End: ?

## 2023-09-11 MED ORDER — OLMESARTAN MEDOXOMIL 20 MG PO TABS: 10.0000 mg | ORAL_TABLET | Freq: Every day | ORAL | 0 refills | Status: DC

## 2023-09-11 NOTE — Patient Instructions (Signed)

## 2023-09-11 NOTE — Progress Notes (Signed)
 Cardiology Office Note:  .   Date:  09/11/2023  ID:  RICHERD GRIME, DOB 1936-06-13, MRN [id number] PCP: Renford Dills, MD  [facility] Providers Cardiologist:  Yates Decamp, MD   History of Present Illness: .   [facility] Elliott is a 88 y.o.  African-American male with history of hypertension, hyperlipidemia, diabetes mellitus with stage IIIa chronic kidney disease, remote history of prostate cancer, mild obesity. Coronary angiography on  09/30/2020 revealing severe multivessel CAD, underwent elective CABG x4 on 10/12/2020.   Discussed the use of AI scribe software for clinical note transcription with the patient, who gave verbal consent to proceed.  History of Present Illness   The patient, an 88 year old male with a history of heart bypass surgery  presents for a routine check-up. He reports some discomfort in his leg since the back operation, but overall feels good. He admits to not always eating right, but has lost weight and is trying to maintain a healthy diet. He has been less physically active since his back operation, previously going to the gym two to three times a week. He is on medication for high cholesterol.     Labs   Lab Results  Component Value Date   CHOL 105 09/09/2022   HDL 60 09/09/2022   LDLCALC 32 09/09/2022   TRIG 55 09/09/2022   CHOLHDL 2.5 10/16/2020   Lab Results  Component Value Date   NA 138 10/26/2022   K 3.7 10/26/2022   CO2 24 10/17/2022   GLUCOSE 56 (L) 10/26/2022   BUN 14 10/26/2022   CREATININE 0.90 10/26/2022   CALCIUM 9.5 10/17/2022   EGFR 68 09/09/2022   GFRNONAA 56 (L) 10/17/2022      Latest Ref Rng & Units 10/26/2022    3:03 PM 10/17/2022    8:43 AM 09/09/2022    7:39 AM  BMP  Glucose 70 - 99 mg/dL 56  409  811   BUN 8 - 23 mg/dL 14  23  15    Creatinine 0.61 - 1.24 mg/dL 9.14  7.82  9.56   BUN/Creat Ratio 10 - 24   14   Sodium 135 - 145 mmol/L 138  135  141   Potassium 3.5 - 5.1 mmol/L 3.7  4.5  4.7   Chloride 98 - 111 mmol/L  103  100  101   CO2 22 - 32 mmol/L  24  23   Calcium 8.9 - 10.3 mg/dL  9.5  21.3       Latest Ref Rng & Units 10/26/2022    3:03 PM 10/17/2022    8:43 AM 04/15/2022    3:07 AM  CBC  WBC 4.0 - 10.5 K/uL  6.6  4.9   Hemoglobin 13.0 - 17.0 g/dL 08.6  57.8  46.9   Hematocrit 39.0 - 52.0 % 35.0  41.6  38.3   Platelets 150 - 400 K/uL  279  237    Lab Results  Component Value Date   HGBA1C 7.6 (H) 10/17/2022    Lab Results  Component Value Date   TSH 1.630 11/15/2016    K PN PCP labs 06/26/2023:  Total cholesterol 97, triglycerides 47, HDL 49, LDL 36.  A1c 7.8%.  Hb 12.7, platelets 200 79K.  Review of Systems  Cardiovascular:  Negative for chest pain, dyspnea on exertion and leg swelling.   Physical Exam:   VS:  BP 126/60 (BP Location: Left Arm, Patient Position: Sitting, Cuff Size: Large)   Pulse 67   Resp  16   Ht 5\' 6"  (1.676 m)   Wt 209 lb (94.8 kg)   SpO2 94%   BMI 33.73 kg/m    Wt Readings from Last 3 Encounters:  09/11/23 209 lb (94.8 kg)  10/26/22 210 lb (95.3 kg)  10/17/22 205 lb 9.6 oz (93.3 kg)    Physical Exam Neck:     Vascular: No carotid bruit or JVD.  Cardiovascular:     Rate and Rhythm: Normal rate and regular rhythm.     Pulses: Intact distal pulses.     Heart sounds: Normal heart sounds. No murmur heard.    No gallop.  Pulmonary:     Effort: Pulmonary effort is normal.     Breath sounds: Normal breath sounds.  Abdominal:     General: Bowel sounds are normal.     Palpations: Abdomen is soft.  Musculoskeletal:     Right lower leg: No edema.     Left lower leg: No edema.    Studies Reviewed: Marland Kitchen    Left Heart Catheterization 08/18/20:  Left Heart Catheterization 09/30/20: (progression of LAD disease and hence CABG recommended)    CABG x4 on 10/12/2020: LIMA to LAD, SVG to PDA, SVG to OM1, SVG to OM 2.  EKG:    EKG Interpretation Date/Time:  Monday September 11 2023 16:50:12 EST Ventricular Rate:  66 PR Interval:  162 QRS Duration:  142 QT  Interval:  434 QTC Calculation: 454 R Axis:   -20  Text Interpretation: EKG 09/11/2023: Normal sinus rhythm with rate of 66 bpm, normal axis, right bundle branch block.  Compared to 04/15/2022, no significant change. Confirmed by Delrae Rend [phone]) on 09/11/2023 5:16:41 PM    Medications and allergies    No Known Allergies   Current Outpatient Medications:    ACCU-CHEK AVIVA PLUS test strip, Inject 1 application as directed 2 (two) times daily., Disp: , Rfl:    acetaminophen (TYLENOL) 650 MG CR tablet, Take 650 mg by mouth every 8 (eight) hours as needed for pain., Disp: , Rfl:    Dulaglutide 1.5 MG/0.5ML SOAJ, Inject 1.5 mg into the skin every 7 (seven) days., Disp: , Rfl:    LANTUS SOLOSTAR 100 UNIT/ML Solostar Pen, Inject 30 Units into the skin daily before supper., Disp: , Rfl:    metFORMIN (GLUCOPHAGE-XR) 500 MG 24 hr tablet, Take 2 tablets (1,000 mg total) by mouth 2 (two) times daily., Disp: , Rfl:    metoprolol tartrate (LOPRESSOR) 25 MG tablet, Take 1 tablet (25 mg total) by mouth 2 (two) times daily. (Patient taking differently: Take 730 mg by mouth 2 (two) times daily.), Disp: 60 tablet, Rfl: 2   nitroGLYCERIN (NITROSTAT) 0.4 MG SL tablet, Place 1 tablet (0.4 mg total) under the tongue every 5 (five) minutes as needed for chest pain., Disp: 30 tablet, Rfl: 3   rosuvastatin (CRESTOR) 20 MG tablet, Take 1 tablet (20 mg total) by mouth daily., Disp: 90 tablet, Rfl: 0   ezetimibe (ZETIA) 10 MG tablet, Take 1 tablet (10 mg total) by mouth daily., Disp: 90 tablet, Rfl: 3   olmesartan (BENICAR) 20 MG tablet, Take 0.5 tablets (10 mg total) by mouth daily., Disp: 45 tablet, Rfl: 0   ASSESSMENT AND PLAN: .      ICD-10-CM   1. Coronary artery disease of native artery of native heart with stable angina pectoris (HCC)  I25.118 EKG 12-Lead    2. Essential hypertension  I10 olmesartan (BENICAR) 20 MG tablet    3. Hypercholesteremia  E78.00 ezetimibe (ZETIA) 10 MG tablet      1.  Coronary artery disease of native artery of native heart with stable angina pectoris [facility]) Patient is presently doing well and states that he has not used any sublingual nitroglycerin since his CABG.  He is accompanied by his wife.  No clinical evidence of heart failure.  Presently on aspirin 81 mg daily, metoprolol tartrate 25 mg twice daily, Crestor 20 mg daily and Zetia 10 mg daily.  He also has sublingual nitroglycerin that he has not used.  2. Essential hypertension Blood pressure is well-controlled on olmesartan 20 mg daily along with metoprolol tartrate.  3. Hypercholesteremia Reviewed his lipids, under excellent control, external labs reviewed.  4. Type 2 Diabetes Mellitus   A1c is 7.8%, indicating suboptimal control.  Discussed weight regulation and dietary management to prevent weight regain.  Consider discussing alternative diabetes management options with primary care provider including addition of SGLT2 inhibitor.  Otherwise from cardiac standpoint is presently doing well, I will see him back in a year or sooner if problems. Signed,  Yates Decamp, MD, [facility] 09/11/2023, 5:58 PM [facility] [address] [place], [place] [address] Phone: [phone]. Fax:  [phone]

## 2023-09-13 ENCOUNTER — Other Ambulatory Visit: Payer: Self-pay

## 2023-09-13 DIAGNOSIS — I10 Essential (primary) hypertension: Secondary | ICD-10-CM

## 2023-09-13 MED ORDER — OLMESARTAN MEDOXOMIL 20 MG PO TABS
10.0000 mg | ORAL_TABLET | Freq: Every day | ORAL | 3 refills | Status: AC
Start: 2023-09-13 — End: ?

## 2023-09-26 DIAGNOSIS — R112 Nausea with vomiting, unspecified: Secondary | ICD-10-CM | POA: Diagnosis not present

## 2023-09-26 DIAGNOSIS — K920 Hematemesis: Secondary | ICD-10-CM | POA: Diagnosis not present

## 2023-09-27 DIAGNOSIS — R112 Nausea with vomiting, unspecified: Secondary | ICD-10-CM | POA: Diagnosis not present

## 2023-09-27 DIAGNOSIS — K920 Hematemesis: Secondary | ICD-10-CM | POA: Diagnosis not present

## 2023-10-02 DIAGNOSIS — K920 Hematemesis: Secondary | ICD-10-CM | POA: Diagnosis not present

## 2023-10-02 DIAGNOSIS — K219 Gastro-esophageal reflux disease without esophagitis: Secondary | ICD-10-CM | POA: Diagnosis not present

## 2023-10-05 ENCOUNTER — Other Ambulatory Visit: Payer: Self-pay

## 2023-10-05 MED ORDER — ROSUVASTATIN CALCIUM 20 MG PO TABS: 20.0000 mg | ORAL_TABLET | Freq: Every day | ORAL | 3 refills | Status: AC

## 2023-10-19 DIAGNOSIS — E119 Type 2 diabetes mellitus without complications: Secondary | ICD-10-CM | POA: Diagnosis not present

## 2023-10-26 DIAGNOSIS — E119 Type 2 diabetes mellitus without complications: Secondary | ICD-10-CM | POA: Diagnosis not present

## 2023-10-27 DIAGNOSIS — H35371 Puckering of macula, right eye: Secondary | ICD-10-CM | POA: Diagnosis not present

## 2023-10-27 DIAGNOSIS — H35033 Hypertensive retinopathy, bilateral: Secondary | ICD-10-CM | POA: Diagnosis not present

## 2023-10-27 DIAGNOSIS — H524 Presbyopia: Secondary | ICD-10-CM | POA: Diagnosis not present

## 2023-10-27 DIAGNOSIS — H40013 Open angle with borderline findings, low risk, bilateral: Secondary | ICD-10-CM | POA: Diagnosis not present

## 2023-10-27 DIAGNOSIS — E119 Type 2 diabetes mellitus without complications: Secondary | ICD-10-CM | POA: Diagnosis not present

## 2023-12-25 DIAGNOSIS — Z794 Long term (current) use of insulin: Secondary | ICD-10-CM | POA: Diagnosis not present

## 2023-12-25 DIAGNOSIS — E7800 Pure hypercholesterolemia, unspecified: Secondary | ICD-10-CM | POA: Diagnosis not present

## 2023-12-25 DIAGNOSIS — N1831 Chronic kidney disease, stage 3a: Secondary | ICD-10-CM | POA: Diagnosis not present

## 2023-12-25 DIAGNOSIS — I25118 Atherosclerotic heart disease of native coronary artery with other forms of angina pectoris: Secondary | ICD-10-CM | POA: Diagnosis not present

## 2023-12-25 DIAGNOSIS — E1122 Type 2 diabetes mellitus with diabetic chronic kidney disease: Secondary | ICD-10-CM | POA: Diagnosis not present

## 2023-12-25 DIAGNOSIS — Z0000 Encounter for general adult medical examination without abnormal findings: Secondary | ICD-10-CM | POA: Diagnosis not present

## 2023-12-25 DIAGNOSIS — R197 Diarrhea, unspecified: Secondary | ICD-10-CM | POA: Diagnosis not present

## 2023-12-25 DIAGNOSIS — I10 Essential (primary) hypertension: Secondary | ICD-10-CM | POA: Diagnosis not present

## 2023-12-27 ENCOUNTER — Other Ambulatory Visit: Payer: Self-pay

## 2024-01-31 DIAGNOSIS — E119 Type 2 diabetes mellitus without complications: Secondary | ICD-10-CM | POA: Diagnosis not present

## 2024-02-02 DIAGNOSIS — J069 Acute upper respiratory infection, unspecified: Secondary | ICD-10-CM | POA: Diagnosis not present

## 2024-03-18 DIAGNOSIS — M5416 Radiculopathy, lumbar region: Secondary | ICD-10-CM | POA: Diagnosis not present

## 2024-03-25 DIAGNOSIS — M545 Low back pain, unspecified: Secondary | ICD-10-CM | POA: Diagnosis not present

## 2024-04-01 DIAGNOSIS — M5416 Radiculopathy, lumbar region: Secondary | ICD-10-CM | POA: Diagnosis not present

## 2024-04-11 ENCOUNTER — Other Ambulatory Visit: Payer: Self-pay | Admitting: Orthopedic Surgery

## 2024-04-18 NOTE — Progress Notes (Signed)
 Surgical Instructions   Your procedure is scheduled on Wednesday, October 15th. Report to [facility] Main Entrance A at 6:30 A.M., then check in with the Admitting office. Any questions or running late day of surgery: call [phone]  Questions prior to your surgery date: call [phone], Monday-Friday, 8am-4pm. If you experience any cold or flu symptoms such as cough, fever, chills, shortness of breath, etc. between now and your scheduled surgery, please notify us  at the above number.     Remember:  Do not eat after midnight the night before your surgery  You may drink clear liquids until 5:30 the morning of your surgery.   Clear liquids allowed are: Water , Non-Citrus Juices (without pulp), Carbonated Beverages, Clear Tea (no milk, honey, etc.), Black Coffee Only (NO MILK, CREAM OR POWDERED CREAMER of any kind), and Gatorade.    Take these medicines the morning of surgery with A SIP OF WATER   ezetimibe  (ZETIA )  metoprolol  tartrate (LOPRESSOR )  rosuvastatin  (CRESTOR )    May take these medicines IF NEEDED: acetaminophen  (TYLENOL )  nitroGLYCERIN  (NITROSTAT ) - call [phone] if you take this medication   Follow your surgeon's instructions on when to stop Asprin.  If no instructions were given by your surgeon then you will need to call the office to get those instructions.     One week prior to surgery, STOP taking any Aleve, Naproxen, Ibuprofen , Motrin , Advil , Goody's, BC's, all herbal medications, fish oil, and non-prescription vitamins.   WHAT DO I DO ABOUT MY DIABETES MEDICATION?   Do not take oral diabetes medicines metFORMIN  (GLUCOPHAGE -XR) the morning of surgery.  As of today, HOLD your Dulaglutide until after your surgery.    THE NIGHT BEFORE SURGERY, take 17 units (50%) of LANTUS  SOLOSTAR insulin .    HOW TO MANAGE YOUR DIABETES BEFORE AND AFTER SURGERY  Why is it important to control my blood sugar before and after surgery? Improving blood sugar levels  before and after surgery helps healing and can limit problems. A way of improving blood sugar control is eating a healthy diet by:  Eating less sugar and carbohydrates  Increasing activity/exercise  Talking with your doctor about reaching your blood sugar goals High blood sugars (greater than 180 mg/dL) can raise your risk of infections and slow your recovery, so you will need to focus on controlling your diabetes during the weeks before surgery. Make sure that the doctor who takes care of your diabetes knows about your planned surgery including the date and location.  How do I manage my blood sugar before surgery? Check your blood sugar at least 4 times a day, starting 2 days before surgery, to make sure that the level is not too high or low.  Check your blood sugar the morning of your surgery when you wake up and every 2 hours until you get to the Short Stay unit.  If your blood sugar is less than 70 mg/dL, you will need to treat for low blood sugar: Do not take insulin . Treat a low blood sugar (less than 70 mg/dL) with  cup of clear juice (cranberry or apple), 4 glucose tablets, OR glucose gel. Recheck blood sugar in 15 minutes after treatment (to make sure it is greater than 70 mg/dL). If your blood sugar is not greater than 70 mg/dL on recheck, call [phone] for further instructions. Report your blood sugar to the short stay nurse when you get to Short Stay.  If you are admitted to the hospital after surgery: Your blood sugar will be checked  by the staff and you will probably be given insulin  after surgery (instead of oral diabetes medicines) to make sure you have good blood sugar levels. The goal for blood sugar control after surgery is 80-180 mg/dL.                     Do NOT Smoke (Tobacco/Vaping) for 24 hours prior to your procedure.  If you use a CPAP at night, you may bring your mask/headgear for your overnight stay.   You will be asked to remove any contacts, glasses,  piercing's, hearing aid's, dentures/partials prior to surgery. Please bring cases for these items if needed.    Patients discharged the day of surgery will not be allowed to drive home, and someone needs to stay with them for 24 hours.  SURGICAL WAITING ROOM VISITATION Patients may have no more than 2 support people in the waiting area - these visitors may rotate.   Pre-op  nurse will coordinate an appropriate time for 1 ADULT support person, who may not rotate, to accompany patient in pre-op .  Children under the age of 40 must have an adult with them who is not the patient and must remain in the main waiting area with an adult.  If the patient needs to stay at the hospital during part of their recovery, the visitor guidelines for inpatient rooms apply.  Please refer to the [facility] website for the visitor guidelines for any additional information.   If you received a COVID test during your pre-op  visit  it is requested that you wear a mask when out in public, stay away from anyone that may not be feeling well and notify your surgeon if you develop symptoms. If you have been in contact with anyone that has tested positive in the last 10 days please notify you surgeon.      Pre-operative 4 CHG Bathing Instructions   You can play a key role in reducing the risk of infection after surgery. Your skin needs to be as free of germs as possible. You can reduce the number of germs on your skin by washing with CHG (chlorhexidine  gluconate) soap before surgery. CHG is an antiseptic soap that kills germs and continues to kill germs even after washing.   DO NOT use if you have an allergy to chlorhexidine /CHG or antibacterial soaps. If your skin becomes reddened or irritated, stop using the CHG and notify one of our RNs at [phone].   Please shower with the CHG soap starting 4 days before surgery using the following schedule:     Please keep in mind the following:  DO NOT shave, including legs  and underarms, starting the day of your first shower.   You may shave your face at any point before/day of surgery.  Place clean sheets on your bed the day you start using CHG soap. Use a clean washcloth (not used since being washed) for each shower. DO NOT sleep with pets once you start using the CHG.   CHG Shower Instructions:  Wash your face and private area with normal soap. If you choose to wash your hair, wash first with your normal shampoo.  After you use shampoo/soap, rinse your hair and body thoroughly to remove shampoo/soap residue.  Turn the water  OFF and apply  bottle of CHG soap to a CLEAN washcloth.  Apply CHG soap ONLY FROM YOUR NECK DOWN TO YOUR TOES (washing for 3-5 minutes)  DO NOT use CHG soap on face, private areas, open  wounds, or sores.  Pay special attention to the area where your surgery is being performed.  If you are having back surgery, having someone wash your back for you may be helpful. Wait 2 minutes after CHG soap is applied, then you may rinse off the CHG soap.  Pat dry with a clean towel  Put on clean clothes/pajamas   If you choose to wear lotion, please use ONLY the CHG-compatible lotions that are listed below.  Additional instructions for the day of surgery:  If you choose, you may shower the morning of surgery with an antibacterial soap.  DO NOT APPLY any lotions, deodorants, cologne, or perfumes.   Do not bring valuables to the hospital. [facility] is not responsible for any belongings/valuables. Do not wear nail polish, gel polish, artificial nails, or any other type of covering on natural nails (fingers and toes) Do not wear jewelry or makeup Put on clean/comfortable clothes.  Please brush your teeth.  Ask your nurse before applying any prescription medications to the skin.     CHG Compatible Lotions   Aveeno Moisturizing lotion  Cetaphil Moisturizing Cream  Cetaphil Moisturizing Lotion  Clairol Herbal Essence Moisturizing Lotion, Dry  Skin  Clairol Herbal Essence Moisturizing Lotion, Extra Dry Skin  Clairol Herbal Essence Moisturizing Lotion, Normal Skin  Curel Age Defying Therapeutic Moisturizing Lotion with Alpha Hydroxy  Curel Extreme Care Body Lotion  Curel Soothing Hands Moisturizing Hand Lotion  Curel Therapeutic Moisturizing Cream, Fragrance-Free  Curel Therapeutic Moisturizing Lotion, Fragrance-Free  Curel Therapeutic Moisturizing Lotion, Original Formula  Eucerin Daily Replenishing Lotion  Eucerin Dry Skin Therapy Plus Alpha Hydroxy Crme  Eucerin Dry Skin Therapy Plus Alpha Hydroxy Lotion  Eucerin Original Crme  Eucerin Original Lotion  Eucerin Plus Crme Eucerin Plus Lotion  Eucerin TriLipid Replenishing Lotion  Keri Anti-Bacterial Hand Lotion  Keri Deep Conditioning Original Lotion Dry Skin Formula Softly Scented  Keri Deep Conditioning Original Lotion, Fragrance Free Sensitive Skin Formula  Keri Lotion Fast Absorbing Fragrance Free Sensitive Skin Formula  Keri Lotion Fast Absorbing Softly Scented Dry Skin Formula  Keri Original Lotion  Keri Skin Renewal Lotion Keri Silky Smooth Lotion  Keri Silky Smooth Sensitive Skin Lotion  Nivea Body Creamy Conditioning Oil  Nivea Body Extra Enriched Lotion  Nivea Body Original Lotion  Nivea Body Sheer Moisturizing Lotion Nivea Crme  Nivea Skin Firming Lotion  NutraDerm 30 Skin Lotion  NutraDerm Skin Lotion  NutraDerm Therapeutic Skin Cream  NutraDerm Therapeutic Skin Lotion  ProShield Protective Hand Cream  Provon moisturizing lotion  Please read over the following fact sheets that you were given.

## 2024-04-19 ENCOUNTER — Other Ambulatory Visit: Payer: Self-pay

## 2024-04-19 ENCOUNTER — Encounter (HOSPITAL_COMMUNITY)
Admission: RE | Admit: 2024-04-19 | Discharge: 2024-04-19 | Disposition: A | Discharge status: Home / Self Care | Source: Ambulatory Visit | Attending: Orthopedic Surgery | Admitting: Orthopedic Surgery

## 2024-04-19 ENCOUNTER — Encounter (HOSPITAL_COMMUNITY): Payer: Self-pay

## 2024-04-19 VITALS — BP 184/78 | HR 54 | Temp 97.8°F | Resp 18 | Ht 66.0 in | Wt 208.9 lb

## 2024-04-19 DIAGNOSIS — Z951 Presence of aortocoronary bypass graft: Secondary | ICD-10-CM | POA: Diagnosis not present

## 2024-04-19 DIAGNOSIS — I10 Essential (primary) hypertension: Secondary | ICD-10-CM | POA: Insufficient documentation

## 2024-04-19 DIAGNOSIS — Z01818 Encounter for other preprocedural examination: Secondary | ICD-10-CM

## 2024-04-19 DIAGNOSIS — Z01812 Encounter for preprocedural laboratory examination: Secondary | ICD-10-CM | POA: Diagnosis not present

## 2024-04-19 DIAGNOSIS — Z87891 Personal history of nicotine dependence: Secondary | ICD-10-CM | POA: Insufficient documentation

## 2024-04-19 DIAGNOSIS — Z7984 Long term (current) use of oral hypoglycemic drugs: Secondary | ICD-10-CM | POA: Diagnosis not present

## 2024-04-19 DIAGNOSIS — Z8546 Personal history of malignant neoplasm of prostate: Secondary | ICD-10-CM | POA: Insufficient documentation

## 2024-04-19 DIAGNOSIS — E119 Type 2 diabetes mellitus without complications: Secondary | ICD-10-CM | POA: Insufficient documentation

## 2024-04-19 DIAGNOSIS — M5126 Other intervertebral disc displacement, lumbar region: Secondary | ICD-10-CM | POA: Insufficient documentation

## 2024-04-19 DIAGNOSIS — I2510 Atherosclerotic heart disease of native coronary artery without angina pectoris: Secondary | ICD-10-CM | POA: Insufficient documentation

## 2024-04-19 LAB — CBC
HCT: 40.9 % (ref 39.0–52.0)
Hemoglobin: 13.5 g/dL (ref 13.0–17.0)
MCH: 28.9 pg (ref 26.0–34.0)
MCHC: 33 g/dL (ref 30.0–36.0)
MCV: 87.6 fL (ref 80.0–100.0)
Platelets: 271 K/uL (ref 150–400)
RBC: 4.67 MIL/uL (ref 4.22–5.81)
RDW: 12.9 % (ref 11.5–15.5)
WBC: 4.8 K/uL (ref 4.0–10.5)
nRBC: 0 % (ref 0.0–0.2)

## 2024-04-19 LAB — BASIC METABOLIC PANEL WITH GFR
Anion gap: 11 (ref 5–15)
BUN: 17 mg/dL (ref 8–23)
CO2: 25 mmol/L (ref 22–32)
Calcium: 9.2 mg/dL (ref 8.9–10.3)
Chloride: 103 mmol/L (ref 98–111)
Creatinine, Ser: 1.25 mg/dL — ABNORMAL HIGH (ref 0.61–1.24)
GFR, Estimated: 56 mL/min — ABNORMAL LOW (ref >60)
Glucose, Bld: 65 mg/dL — ABNORMAL LOW (ref 70–99)
Potassium: 4.3 mmol/L (ref 3.5–5.1)
Sodium: 139 mmol/L (ref 135–145)

## 2024-04-19 LAB — HEMOGLOBIN A1C
Hgb A1c MFr Bld: 8.1 % — ABNORMAL HIGH (ref 4.8–5.6)
Mean Plasma Glucose: 185.77 mg/dL

## 2024-04-19 LAB — SURGICAL PCR SCREEN
MRSA, PCR: NEGATIVE
Staphylococcus aureus: NEGATIVE

## 2024-04-19 LAB — GLUCOSE, CAPILLARY: Glucose-Capillary: 92 mg/dL (ref 70–99)

## 2024-04-19 NOTE — Progress Notes (Addendum)
 Patient took his flu/covid vaccine 2 weeks ago  PCP - Dr. Tanda polite Cardiologist - Dr. Gordy Bergamo LOV 09-11-23 w/follow up in 1 year  PPM/ICD - Denies Device Orders - n/a Rep Notified - n/a  Chest x-ray - n/a EKG - 09-11-23 Stress Test - 06-29-20 ECHO - 10-12-20 Cardiac Cath - 09-30-20  Sleep Study - Denies CPAP - n/a  Fasting Blood Sugar - 80-100 Checks Blood Sugar: patient wears a freestyle libre 3 on left upper arm.  Checks many times a day  Last dose of GLP1 agonist-  Duglaglutide (trulicity) GLP1 instructions: last dose on 04-17-24 and will resume Wednesday after surgery.  Blood Thinner Instructions: Denies Aspirin  Instructions: per patient last dose taken on 04-17-24.  ERAS Protcol - Clears PRE-SURGERY Ensure or G2- G2  COVID TEST- n/a   Anesthesia review: Yes, HTN, CAD, DM2, CKD3  Patient denies shortness of breath, fever, cough and chest pain at PAT appointment. Patient denies any respiratory issues at this time.   All instructions explained to the patient, with a verbal understanding of the material. Patient agrees to go over the instructions while at home for a better understanding. Patient also instructed to self quarantine after being tested for COVID-19. The opportunity to ask questions was provided.

## 2024-04-22 NOTE — Progress Notes (Signed)
 Anesthesia Chart Review:  Case: [id number] Date/Time: 04/24/24 0815   Procedure: DECOMPRESSIVE LUMBAR LAMINECTOMY LEVEL 2 - LUMBAR 4- LUMBAR 5 DECOMPRESSION   Anesthesia type: General   Pre-op  diagnosis: DISC HERNIATION   [facility] / [facility]   Surgeons: Beuford Anes, MD       DISCUSSION: Patient is an 88 year old male scheduled for the above procedure.   History includes former smoker (quit 02/13/74), CAD (occluded ostial LCX with collaterals from RCA, unsuccessful PCI 08/18/20; s/p CABG: LIMA-LAD, SVG-RPDA, SVG-OM1, SVG-OM2 10/12/20), DM2, HTN, exertional dyspnea, prostate cancer (s/p robotic-assisted laparoscopic radical retropubic prostatectomy 06/13/07), neck surgery (C3-6 ACDF 06/04/99; L4-5, L5-S1 laminectomy/foraminotomy 10/26/22). BMI is consistent with obesity.   Last cardiology visit with Dr. Gordy Bergamo was on 09/11/2023. He reported some leg discomfort since his back surgery 10/2022. Doing well from cardiac standpoint. No Nitroglycerin  use. No clinical signs of heart failure. HTN well controlled. Deferred DM medication adjustment to PCP. One year follow-up planned. He denied SOB and chest pain at PAT RN visit.   Last Trulicity 04/17/2024. A1c 8.1% on 04/19/2024.  glucose 65. Creatinine 1.25. CBC normal.  Last ASA 04/17/2024.    BP elevated at PAT at 184/78. It does not appear to have been rechecked. Previous BP 126/60 in March and 145/74 on 02/02/2024 visit at [facility]. He is on Lopressor  25 mg BID (but he is only taking 12.5 mg BID per preference) and olmesartan  5 mg daily.  If BP elevated on arrival for surgery could consider giving an additional 12.5 mg of metoprolol .  Anesthesia team to evaluate on the day of surgery.     VS: BP (!) 184/78   Pulse (!) 54   Temp 36.6 C   Resp 18   Ht 5' 6 (1.676 m)   Wt 94.8 kg   SpO2 100%   BMI 33.72 kg/m  BP Readings from Last 3 Encounters:  04/19/24 (!) 184/78  09/11/23 126/60  10/27/22 115/76     PROVIDERS: Rexanne Ingle, MD is PCP  Bergamo Gordy, MD is cardiologist    LABS: Preoperative labs noted. 12/26/2023 AST and ALT were normal, TSH 1.67, A1c 7.9%  ([facility]). (all labs ordered are listed, but only abnormal results are displayed)  Labs Reviewed  HEMOGLOBIN A1C - Abnormal; Notable for the following components:      Result Value   Hgb A1c MFr Bld 8.1 (*)    All other components within normal limits  BASIC METABOLIC PANEL WITH GFR - Abnormal; Notable for the following components:   Glucose, Bld 65 (*)    Creatinine, Ser 1.25 (*)    GFR, Estimated 56 (*)    All other components within normal limits  SURGICAL PCR SCREEN  GLUCOSE, CAPILLARY  CBC     IMAGES: MRI L-spine 05/10/2023: IMPRESSION: 1. Transitional lumbar anatomy again noted with sacralization L5. 2. Mild bilateral lateral recess encroachment at L1-2. 3. Mild to moderate spinal stenosis and moderate bilateral lateral recess stenosis at L2-3. Stable mild bilateral foraminal stenosis. 4. Moderate spinal stenosis and moderately severe bilateral lateral recess stenosis, right greater than left at L3-4. There is also bilateral foraminal stenosis, unchanged. 5. Moderate foraminal narrowing bilaterally, left greater than right, at L4-5.   EKG: EKG 09/11/2023: Normal sinus rhythm with rate of 66 bpm, normal axis, right bundle branch block. Compared to 04/15/2022, no significant change. Confirmed by Ganji, Jagadeesh ([id number]) on 09/11/2023 5:16:41 PM   CV: TEE (Intra-op CABG) 10/12/2020: POST-OP IMPRESSIONS  Limited Post CPB exam: The patient separated easily from CPB.  - Left Ventricle: Left ventricular function is essentially unchanged from  pre-bypass images. Overall contractility appears normal, with no regional  motion  abnormalities. EF 55-60%.  - Right Ventricle: RV function remains mildly reduced. Contractility is  improving with time off of CPB. The cavity size is normal.  - Aortic Valve: The aortic valve function appears  unchanged from  pre-bypass  images.  - Mitral Valve: The mitral valve function appears unchanged from  pre-bypass  images.  - Tricuspid Valve: Tricuspid valve function is essentially unchanged from  pre-CPB images. There is mild tricuspid regurgitation.      Carotid US  10/08/2020: Summary:  Right Carotid: Velocities in the right ICA are consistent with a 1-39%  stenosis.  Left Carotid: Velocities in the left ICA are consistent with a 1-39%  stenosis.  Vertebrals:  Bilateral vertebral arteries demonstrate antegrade flow.  Subclavians: Normal flow hemodynamics were seen in bilateral subclavian arteries.      Cardiac cath 09/30/2020 (PRE-CABG): Ost LM to Mid LM lesion is 40% stenosed. Ost LAD to Prox LAD lesion is 75% stenosed. Ost Cx to Prox Cx lesion is 100% stenosed.  1. Severe multivessel CAD with CTO of the ostial LCx and significant proximal LAD disease by iFR of 0.71 Plan: consider CABG - S/p CABG: LIMA-LAD, SVG-RPDA, SVG-OM1, SVG-OM2 10/12/20      Echocardiogram 07/14/2020:  Normal LV systolic function with visual EF 55-60%. Left ventricle cavity  is normal in size. Mild left ventricular hypertrophy. Normal global wall  motion. Indeterminate diastolic filling pattern, normal LAP.  Mild (Grade I) mitral regurgitation.  Mild tricuspid regurgitation.  Compared to prior study dated 09/13/2017 no significant change.   Past Medical History:  Diagnosis Date   Cancer [facility])    Prostate   Coronary artery disease    Diabetes mellitus without complication (HCC)    Dyspnea    with exterion   Hyperlipidemia    Hypertension     Past Surgical History:  Procedure Laterality Date   ANTERIOR FUSION CERVICAL SPINE     CORONARY ANGIOGRAPHY N/A 09/30/2020   Procedure: CORONARY ANGIOGRAPHY;  Surgeon: Swaziland, Peter M, MD;  Location: [facility] INVASIVE CV LAB;  Service: Cardiovascular;  Laterality: N/A;   CORONARY ARTERY BYPASS GRAFT N/A 10/12/2020   Procedure: CORONARY ARTERY BYPASS GRAFTING  (CABG) TIMES FOUR USING LEFT INTERNAL MAMMARY ARTERY AND RIGHT GREATER SAPHENOUS VEIN HARVESTED ENDOSCOPICALLY;  Surgeon: German Bartlett PEDLAR, MD;  [facility];  Service: Open Heart Surgery;  Laterality: N/A;   CORONARY PRESSURE/FFR STUDY N/A 09/30/2020   Procedure: INTRAVASCULAR PRESSURE WIRE/FFR STUDY;  Surgeon: Swaziland, Peter M, MD;  Location: [facility] INVASIVE CV LAB;  Service: Cardiovascular;  Laterality: N/A;   HERNIA REPAIR     LEFT HEART CATH AND CORONARY ANGIOGRAPHY N/A 11/15/2016   Procedure: Left Heart Cath and Coronary Angiography;  Surgeon: Ladona Heinz, MD;  Location: [facility] INVASIVE CV LAB;  Service: Cardiovascular;  Laterality: N/A;   LEFT HEART CATH AND CORONARY ANGIOGRAPHY N/A 08/18/2020   Procedure: LEFT HEART CATH AND CORONARY ANGIOGRAPHY;  Surgeon: Ladona Heinz, MD;  [facility] LAB;  Service: Cardiovascular;  Laterality: N/A;   LUMBAR LAMINECTOMY/DECOMPRESSION MICRODISCECTOMY N/A 10/26/2022   Procedure: LUMBAR 4- LUMBAR 5, LUMBAR 5- SACRUM 1 DECOMPRESSION;  Surgeon: Beuford Anes, MD;  Location[facility];  Service: Orthopedics;  Laterality: N/A;   PROSTATE ABLATION     ROBOT ASSISTED LAPAROSCOPIC RADICAL PROSTATECTOMY  06/13/2007   TEE WITHOUT  CARDIOVERSION N/A 10/12/2020   Procedure: TRANSESOPHAGEAL ECHOCARDIOGRAM (TEE);  Surgeon: German Bartlett PEDLAR, MD;  Location: [facility] OR;  Service: Open Heart Surgery;  Laterality: N/A;   TONSILLECTOMY     removed when pt was in high school    MEDICATIONS:  ACCU-CHEK AVIVA PLUS test strip   acetaminophen  (TYLENOL ) 650 MG CR tablet   aspirin  81 MG chewable tablet   Dulaglutide 3 MG/0.5ML SOAJ   ezetimibe  (ZETIA ) 10 MG tablet   LANTUS  SOLOSTAR 100 UNIT/ML Solostar Pen   metFORMIN  (GLUCOPHAGE -XR) 500 MG 24 hr tablet   metFORMIN  (GLUCOPHAGE -XR) 750 MG 24 hr tablet   metoprolol  tartrate (LOPRESSOR ) 25 MG tablet   nitroGLYCERIN  (NITROSTAT ) 0.4 MG SL tablet   olmesartan  (BENICAR ) 20 MG tablet   rosuvastatin  (CRESTOR ) 20 MG tablet   No  current facility-administered medications for this encounter.    Isaiah Ruder, PA-C Surgical Short Stay/Anesthesiology [facility] Phone [phone] [facility] Phone [phone] 04/22/2024 3:38 PM

## 2024-04-22 NOTE — Anesthesia Preprocedure Evaluation (Signed)
 Anesthesia Evaluation  Patient identified by MRN, date of birth, ID band Patient awake    Reviewed: Allergy & Precautions, H&P , NPO status , Patient's Chart, lab work & pertinent test results  History of Anesthesia Complications (+) history of anesthetic complications  Airway Mallampati: II  TM Distance: >3 FB Neck ROM: Full    Dental no notable dental hx.    Pulmonary shortness of breath, asthma , sleep apnea , COPD, former smoker   Pulmonary exam normal breath sounds clear to auscultation       Cardiovascular hypertension, + Peripheral Vascular Disease  Normal cardiovascular exam Rhythm:Regular Rate:Normal     Neuro/Psych  PSYCHIATRIC DISORDERS      negative neurological ROS     GI/Hepatic negative GI ROS,,,(+)     substance abuse  alcohol  use and cocaine use  Endo/Other  diabetes, Type 2    Renal/GU negative Renal ROS  negative genitourinary   Musculoskeletal  (+) Arthritis ,    Abdominal   Peds negative pediatric ROS (+)  Hematology  (+) Blood dyscrasia, anemia   Anesthesia Other Findings   Reproductive/Obstetrics negative OB ROS                              Anesthesia Physical Anesthesia Plan  ASA: 3  Anesthesia Plan: General   Post-op Pain Management:    Induction: Intravenous  PONV Risk Score and Plan: 2 and Ondansetron  and Dexamethasone   Airway Management Planned: Oral ETT  Additional Equipment: None  Intra-op Plan:   Post-operative Plan: Extubation in OR  Informed Consent: I have reviewed the patients History and Physical, chart, labs and discussed the procedure including the risks, benefits and alternatives for the proposed anesthesia with the patient or authorized representative who has indicated his/her understanding and acceptance.     Dental advisory given  Plan Discussed with: CRNA  Anesthesia Plan Comments: (PAT note written 04/22/2024 by  Byrl Latin, PA-C.  )         Anesthesia Quick Evaluation

## 2024-04-24 ENCOUNTER — Ambulatory Visit (HOSPITAL_COMMUNITY)

## 2024-04-24 ENCOUNTER — Ambulatory Visit (HOSPITAL_COMMUNITY)
Admission: RE | Admit: 2024-04-24 | Discharge: 2024-04-24 | Disposition: A | Discharge status: Home / Self Care | Attending: Orthopedic Surgery | Admitting: Orthopedic Surgery

## 2024-04-24 ENCOUNTER — Ambulatory Visit (HOSPITAL_COMMUNITY): Admitting: Vascular Surgery

## 2024-04-24 ENCOUNTER — Ambulatory Visit (HOSPITAL_COMMUNITY): Admitting: Anesthesiology

## 2024-04-24 ENCOUNTER — Ambulatory Visit (HOSPITAL_COMMUNITY)
Admission: RE | Disposition: A | Discharge status: Home / Self Care | Payer: Self-pay | Source: Home / Self Care | Attending: Orthopedic Surgery

## 2024-04-24 ENCOUNTER — Encounter (HOSPITAL_COMMUNITY): Payer: Self-pay | Admitting: Orthopedic Surgery

## 2024-04-24 ENCOUNTER — Other Ambulatory Visit: Payer: Self-pay

## 2024-04-24 DIAGNOSIS — Z79899 Other long term (current) drug therapy: Secondary | ICD-10-CM | POA: Diagnosis not present

## 2024-04-24 DIAGNOSIS — I2510 Atherosclerotic heart disease of native coronary artery without angina pectoris: Secondary | ICD-10-CM

## 2024-04-24 DIAGNOSIS — M48061 Spinal stenosis, lumbar region without neurogenic claudication: Secondary | ICD-10-CM | POA: Insufficient documentation

## 2024-04-24 DIAGNOSIS — I10 Essential (primary) hypertension: Secondary | ICD-10-CM | POA: Diagnosis not present

## 2024-04-24 DIAGNOSIS — Z951 Presence of aortocoronary bypass graft: Secondary | ICD-10-CM | POA: Insufficient documentation

## 2024-04-24 DIAGNOSIS — Z7985 Long-term (current) use of injectable non-insulin antidiabetic drugs: Secondary | ICD-10-CM | POA: Insufficient documentation

## 2024-04-24 DIAGNOSIS — E119 Type 2 diabetes mellitus without complications: Secondary | ICD-10-CM | POA: Insufficient documentation

## 2024-04-24 DIAGNOSIS — Z87891 Personal history of nicotine dependence: Secondary | ICD-10-CM | POA: Diagnosis not present

## 2024-04-24 DIAGNOSIS — M5126 Other intervertebral disc displacement, lumbar region: Secondary | ICD-10-CM | POA: Diagnosis not present

## 2024-04-24 DIAGNOSIS — Z794 Long term (current) use of insulin: Secondary | ICD-10-CM | POA: Diagnosis not present

## 2024-04-24 DIAGNOSIS — Z7984 Long term (current) use of oral hypoglycemic drugs: Secondary | ICD-10-CM | POA: Diagnosis not present

## 2024-04-24 DIAGNOSIS — M5116 Intervertebral disc disorders with radiculopathy, lumbar region: Secondary | ICD-10-CM | POA: Diagnosis not present

## 2024-04-24 HISTORY — PX: DECOMPRESSIVE LUMBAR LAMINECTOMY LEVEL 2: SHX5792

## 2024-04-24 LAB — GLUCOSE, CAPILLARY
Glucose-Capillary: 103 mg/dL — ABNORMAL HIGH (ref 70–99)
Glucose-Capillary: 102 mg/dL — ABNORMAL HIGH (ref 70–99)

## 2024-04-24 SURGERY — DECOMPRESSIVE LUMBAR LAMINECTOMY LEVEL 2
Anesthesia: General

## 2024-04-24 MED ORDER — METHYLENE BLUE 20 MG/2ML IV SOSY
PREFILLED_SYRINGE | INTRAVENOUS | Status: AC
  Filled 2024-04-24: qty 2

## 2024-04-24 MED ORDER — DEXAMETHASONE SOD PHOSPHATE PF 10 MG/ML IJ SOLN
INTRAMUSCULAR | Status: DC | PRN
  Administered 2024-04-24: 10 mg via INTRAVENOUS

## 2024-04-24 MED ORDER — FENTANYL CITRATE (PF) 250 MCG/5ML IJ SOLN
INTRAMUSCULAR | Status: AC
  Filled 2024-04-24: qty 5

## 2024-04-24 MED ORDER — CEFAZOLIN SODIUM-DEXTROSE 2-4 GM/100ML-% IV SOLN
2.0000 g | INTRAVENOUS | Status: AC
Start: 2024-04-24 — End: 2024-04-24
  Administered 2024-04-24: 2 g via INTRAVENOUS
  Filled 2024-04-24: qty 100

## 2024-04-24 MED ORDER — INSULIN ASPART 100 UNIT/ML IJ SOLN: 0.0000 [IU] | INTRAMUSCULAR | Status: DC | PRN

## 2024-04-24 MED ORDER — PHENYLEPHRINE HCL (PRESSORS) 10 MG/ML IV SOLN
INTRAVENOUS | Status: AC
  Filled 2024-04-24: qty 1

## 2024-04-24 MED ORDER — THROMBIN (RECOMBINANT) 5000 UNITS EX SOLR
CUTANEOUS | Status: DC | PRN
  Administered 2024-04-24: 1 via TOPICAL

## 2024-04-24 MED ORDER — METHYLPREDNISOLONE ACETATE 80 MG/ML IJ SUSP
INTRAMUSCULAR | Status: AC
  Filled 2024-04-24: qty 1

## 2024-04-24 MED ORDER — ORAL CARE MOUTH RINSE: 15.0000 mL | Freq: Once | OROMUCOSAL | Status: AC

## 2024-04-24 MED ORDER — ROCURONIUM BROMIDE 10 MG/ML (PF) SYRINGE
PREFILLED_SYRINGE | INTRAVENOUS | Status: AC
  Filled 2024-04-24: qty 10

## 2024-04-24 MED ORDER — BUPIVACAINE-EPINEPHRINE (PF) 0.25% -1:200000 IJ SOLN
INTRAMUSCULAR | Status: AC
Start: 2024-04-24 — End: 2024-04-24
  Filled 2024-04-24: qty 30

## 2024-04-24 MED ORDER — METHYLPREDNISOLONE ACETATE 80 MG/ML IJ SUSP
INTRAMUSCULAR | Status: DC | PRN
  Administered 2024-04-24: 80 mg

## 2024-04-24 MED ORDER — FENTANYL CITRATE (PF) 100 MCG/2ML IJ SOLN
25.0000 ug | INTRAMUSCULAR | Status: DC | PRN
  Administered 2024-04-24 (×2): 50 ug via INTRAVENOUS

## 2024-04-24 MED ORDER — ACETAMINOPHEN 10 MG/ML IV SOLN
INTRAVENOUS | Status: AC
  Filled 2024-04-24: qty 100

## 2024-04-24 MED ORDER — FENTANYL CITRATE (PF) 100 MCG/2ML IJ SOLN
INTRAMUSCULAR | Status: AC
  Filled 2024-04-24: qty 2

## 2024-04-24 MED ORDER — PROPOFOL 10 MG/ML IV BOLUS
INTRAVENOUS | Status: DC | PRN
  Administered 2024-04-24: 120 mg via INTRAVENOUS
  Administered 2024-04-24: 20 mg via INTRAVENOUS

## 2024-04-24 MED ORDER — ACETAMINOPHEN 10 MG/ML IV SOLN: 1000.0000 mg | Freq: Once | INTRAVENOUS | Status: DC | PRN

## 2024-04-24 MED ORDER — HYDROCODONE-ACETAMINOPHEN 5-325 MG PO TABS: 1.0000 | ORAL_TABLET | Freq: Four times a day (QID) | ORAL | 0 refills | Status: AC | PRN

## 2024-04-24 MED ORDER — SUGAMMADEX SODIUM 200 MG/2ML IV SOLN
INTRAVENOUS | Status: DC | PRN
  Administered 2024-04-24: 200 mg via INTRAVENOUS

## 2024-04-24 MED ORDER — DEXMEDETOMIDINE HCL IN NACL 80 MCG/20ML IV SOLN
INTRAVENOUS | Status: AC
  Filled 2024-04-24: qty 20

## 2024-04-24 MED ORDER — POVIDONE-IODINE 7.5 % EX SOLN: Freq: Once | CUTANEOUS | Status: DC

## 2024-04-24 MED ORDER — THROMBIN 5000 UNITS EX KIT
PACK | CUTANEOUS | Status: AC
  Filled 2024-04-24: qty 2

## 2024-04-24 MED ORDER — BUPIVACAINE LIPOSOME 1.3 % IJ SUSP
INTRAMUSCULAR | Status: DC | PRN
  Administered 2024-04-24: 20 mL

## 2024-04-24 MED ORDER — THROMBIN 20000 UNITS EX KIT
PACK | CUTANEOUS | Status: AC
  Filled 2024-04-24: qty 1

## 2024-04-24 MED ORDER — OXYCODONE HCL 5 MG/5ML PO SOLN: 5.0000 mg | Freq: Once | ORAL | Status: DC | PRN

## 2024-04-24 MED ORDER — OXYCODONE HCL 5 MG PO TABS: 5.0000 mg | ORAL_TABLET | Freq: Once | ORAL | Status: DC | PRN

## 2024-04-24 MED ORDER — ONDANSETRON HCL 4 MG/2ML IJ SOLN
INTRAMUSCULAR | Status: DC | PRN
  Administered 2024-04-24: 4 mg via INTRAVENOUS

## 2024-04-24 MED ORDER — THROMBIN 5000 UNITS EX SOLR
CUTANEOUS | Status: DC | PRN
  Administered 2024-04-24: 5000 [IU] via TOPICAL

## 2024-04-24 MED ORDER — ACETAMINOPHEN 10 MG/ML IV SOLN
INTRAVENOUS | Status: DC | PRN
  Administered 2024-04-24: 1000 mg via INTRAVENOUS

## 2024-04-24 MED ORDER — 0.9 % SODIUM CHLORIDE (POUR BTL) OPTIME
TOPICAL | Status: DC | PRN
  Administered 2024-04-24 (×2): 1000 mL

## 2024-04-24 MED ORDER — DEXMEDETOMIDINE HCL IN NACL 80 MCG/20ML IV SOLN
INTRAVENOUS | Status: DC | PRN
  Administered 2024-04-24 (×2): 4 ug via INTRAVENOUS

## 2024-04-24 MED ORDER — METHOCARBAMOL 500 MG PO TABS: 500.0000 mg | ORAL_TABLET | Freq: Four times a day (QID) | ORAL | 2 refills | Status: AC

## 2024-04-24 MED ORDER — LACTATED RINGERS IV SOLN: INTRAVENOUS | Status: DC | PRN

## 2024-04-24 MED ORDER — PROPOFOL 10 MG/ML IV BOLUS
INTRAVENOUS | Status: AC
  Filled 2024-04-24: qty 20

## 2024-04-24 MED ORDER — LIDOCAINE HCL (CARDIAC) PF 100 MG/5ML IV SOSY: PREFILLED_SYRINGE | INTRAVENOUS | Status: DC | PRN

## 2024-04-24 MED ORDER — THROMBIN 5000 UNITS EX KIT
PACK | CUTANEOUS | Status: AC
  Filled 2024-04-24: qty 1

## 2024-04-24 MED ORDER — LIDOCAINE 2% (20 MG/ML) 5 ML SYRINGE
INTRAMUSCULAR | Status: DC | PRN
  Administered 2024-04-24: 60 mg via INTRAVENOUS

## 2024-04-24 MED ORDER — LIDOCAINE 2% (20 MG/ML) 5 ML SYRINGE
INTRAMUSCULAR | Status: AC
  Filled 2024-04-24: qty 5

## 2024-04-24 MED ORDER — PHENYLEPHRINE HCL-NACL 20-0.9 MG/250ML-% IV SOLN
INTRAVENOUS | Status: DC | PRN
  Administered 2024-04-24: 50 ug/min via INTRAVENOUS

## 2024-04-24 MED ORDER — PHENYLEPHRINE 80 MCG/ML (10ML) SYRINGE FOR IV PUSH (FOR BLOOD PRESSURE SUPPORT)
PREFILLED_SYRINGE | INTRAVENOUS | Status: AC
Start: 2024-04-24 — End: 2024-04-24
  Filled 2024-04-24: qty 10

## 2024-04-24 MED ORDER — ONDANSETRON HCL 4 MG/2ML IJ SOLN
INTRAMUSCULAR | Status: AC
  Filled 2024-04-24: qty 2

## 2024-04-24 MED ORDER — BUPIVACAINE-EPINEPHRINE 0.25% -1:200000 IJ SOLN
INTRAMUSCULAR | Status: DC | PRN
  Administered 2024-04-24: 7 mL

## 2024-04-24 MED ORDER — LACTATED RINGERS IV SOLN: INTRAVENOUS | Status: DC

## 2024-04-24 MED ORDER — ROCURONIUM BROMIDE 100 MG/10ML IV SOLN
INTRAVENOUS | Status: DC | PRN
  Administered 2024-04-24: 60 mg via INTRAVENOUS
  Administered 2024-04-24: 20 mg via INTRAVENOUS

## 2024-04-24 MED ORDER — FENTANYL CITRATE (PF) 250 MCG/5ML IJ SOLN
INTRAMUSCULAR | Status: DC | PRN
  Administered 2024-04-24: 100 ug via INTRAVENOUS
  Administered 2024-04-24: 25 ug via INTRAVENOUS
  Administered 2024-04-24: 50 ug via INTRAVENOUS

## 2024-04-24 MED ORDER — BUPIVACAINE LIPOSOME 1.3 % IJ SUSP
INTRAMUSCULAR | Status: AC
  Filled 2024-04-24: qty 20

## 2024-04-24 MED ORDER — CHLORHEXIDINE GLUCONATE 0.12 % MT SOLN
15.0000 mL | Freq: Once | OROMUCOSAL | Status: AC
  Administered 2024-04-24: 15 mL via OROMUCOSAL
  Filled 2024-04-24: qty 15

## 2024-04-24 MED ORDER — BACITRACIN ZINC 500 UNIT/GM EX OINT
TOPICAL_OINTMENT | CUTANEOUS | Status: AC
  Filled 2024-04-24: qty 28.35

## 2024-04-24 SURGICAL SUPPLY — 65 items
BAG COUNTER SPONGE SURGICOUNT (BAG) ×2 IMPLANT
BENZOIN TINCTURE PRP APPL 2/3 (GAUZE/BANDAGES/DRESSINGS) IMPLANT
BNDG GAUZE DERMACEA FLUFF 4 (GAUZE/BANDAGES/DRESSINGS) IMPLANT
BUR ROUND PRECISION 4.0 (BURR) ×2 IMPLANT
CABLE BIPOLOR RESECTION CORD (MISCELLANEOUS) ×2 IMPLANT
CANISTER SUCTION 3000ML PPV (SUCTIONS) ×2 IMPLANT
COVER SURGICAL LIGHT HANDLE (MISCELLANEOUS) ×2 IMPLANT
DRAPE POUCH INSTRU U-SHP 10X18 (DRAPES) ×4 IMPLANT
DRAPE SURG 17X23 STRL (DRAPES) ×6 IMPLANT
DURAPREP 26ML APPLICATOR (WOUND CARE) ×2 IMPLANT
ELECT BLADE 6.5 EXT (BLADE) IMPLANT
ELECT CAUTERY BLADE 6.4 (BLADE) ×2 IMPLANT
ELECTRODE BLDE 4.0 EZ CLN MEGD (MISCELLANEOUS) IMPLANT
ELECTRODE REM PT RTRN 9FT ADLT (ELECTROSURGICAL) ×2 IMPLANT
EVACUATOR SILICONE 100CC (DRAIN) IMPLANT
FILTER STRAW FLUID ASPIR (MISCELLANEOUS) ×2 IMPLANT
GAUZE 4X4 16PLY ~~LOC~~+RFID DBL (SPONGE) ×4 IMPLANT
GAUZE SPONGE 4X4 12PLY STRL (GAUZE/BANDAGES/DRESSINGS) ×2 IMPLANT
GLOVE BIO SURGEON STRL SZ 6.5 (GLOVE) ×4 IMPLANT
GLOVE BIO SURGEON STRL SZ8 (GLOVE) ×2 IMPLANT
GLOVE BIOGEL PI IND STRL 7.0 (GLOVE) ×2 IMPLANT
GLOVE BIOGEL PI IND STRL 8 (GLOVE) ×2 IMPLANT
GLOVE SURG ENC MOIS LTX SZ6.5 (GLOVE) ×4 IMPLANT
GOWN STRL REUS W/ TWL LRG LVL3 (GOWN DISPOSABLE) ×2 IMPLANT
GOWN STRL REUS W/ TWL XL LVL3 (GOWN DISPOSABLE) ×2 IMPLANT
IV CATH 14GX2 1/4 (CATHETERS) ×2 IMPLANT
KIT BASIN OR (CUSTOM PROCEDURE TRAY) ×2 IMPLANT
KIT TURNOVER KIT B (KITS) ×2 IMPLANT
NDL 18GX1X1/2 (RX/OR ONLY) (NEEDLE) ×2 IMPLANT
NDL 22X1.5 STRL (OR ONLY) (MISCELLANEOUS) ×2 IMPLANT
NDL HYPO 18GX1.5 BLUNT FILL (NEEDLE) IMPLANT
NDL HYPO 25GX1X1/2 BEV (NEEDLE) ×2 IMPLANT
NDL SPNL 18GX3.5 QUINCKE PK (NEEDLE) ×4 IMPLANT
NEEDLE 18GX1X1/2 (RX/OR ONLY) (NEEDLE) ×1 IMPLANT
NEEDLE 22X1.5 STRL (OR ONLY) (MISCELLANEOUS) ×1 IMPLANT
NEEDLE HYPO 18GX1.5 BLUNT FILL (NEEDLE) ×1 IMPLANT
NEEDLE HYPO 25GX1X1/2 BEV (NEEDLE) ×1 IMPLANT
NEEDLE SPNL 18GX3.5 QUINCKE PK (NEEDLE) ×2 IMPLANT
PACK LAMINECTOMY ORTHO (CUSTOM PROCEDURE TRAY) ×2 IMPLANT
PACK UNIVERSAL I (CUSTOM PROCEDURE TRAY) ×2 IMPLANT
PAD ARMBOARD POSITIONER FOAM (MISCELLANEOUS) ×4 IMPLANT
PATTIES SURGICAL .5 X.5 (GAUZE/BANDAGES/DRESSINGS) IMPLANT
PATTIES SURGICAL .5 X1 (DISPOSABLE) ×2 IMPLANT
PENCIL BUTTON HOLSTER BLD 10FT (ELECTRODE) IMPLANT
SOLN 0.9% NACL 1000 ML (IV SOLUTION) ×1 IMPLANT
SOLN 0.9% NACL POUR BTL 1000ML (IV SOLUTION) ×2 IMPLANT
SOLN STERILE WATER 1000 ML (IV SOLUTION) ×1 IMPLANT
SOLN STERILE WATER BTL 1000 ML (IV SOLUTION) ×2 IMPLANT
SPONGE SURGIFOAM ABS GEL SZ50 (HEMOSTASIS) IMPLANT
STRIP CLOSURE SKIN 1/2X4 (GAUZE/BANDAGES/DRESSINGS) IMPLANT
SURGIFLO W/THROMBIN 8M KIT (HEMOSTASIS) IMPLANT
SUT ETHILON 2 0 FS 18 (SUTURE) IMPLANT
SUT MNCRL AB 3-0 PS2 18 (SUTURE) ×2 IMPLANT
SUT VIC AB 1 CT1 18XCR BRD 8 (SUTURE) ×2 IMPLANT
SUT VIC AB 2-0 CT2 18 VCP726D (SUTURE) ×2 IMPLANT
SYR 20ML LL LF (SYRINGE) IMPLANT
SYR 3ML LL SCALE MARK (SYRINGE) IMPLANT
SYR 50ML SLIP (SYRINGE) IMPLANT
SYR BULB IRRIG 60ML STRL (SYRINGE) ×2 IMPLANT
SYR CONTROL 10ML LL (SYRINGE) ×2 IMPLANT
SYR TB 1ML LUER SLIP (SYRINGE) ×8 IMPLANT
TAPE CLOTH SURG 4X10 WHT LF (GAUZE/BANDAGES/DRESSINGS) IMPLANT
TOWEL GREEN STERILE (TOWEL DISPOSABLE) ×2 IMPLANT
TOWEL GREEN STERILE FF (TOWEL DISPOSABLE) ×2 IMPLANT
YANKAUER SUCT BULB TIP NO VENT (SUCTIONS) ×2 IMPLANT

## 2024-04-24 NOTE — Anesthesia Procedure Notes (Signed)
 Procedure Name: Intubation Date/Time: 04/24/2024 8:56 AM  Performed by: Obadiah Reyes BROCKS, CRNAPre-anesthesia Checklist: Patient identified, Emergency Drugs available, Suction available and Patient being monitored Patient Re-evaluated:Patient Re-evaluated prior to induction Oxygen Delivery Method: Circle System Utilized Preoxygenation: Pre-oxygenation with 100% oxygen Induction Type: IV induction Ventilation: Mask ventilation without difficulty Laryngoscope Size: Miller and 2 Grade View: Grade I Tube type: Oral Tube size: 7.5 mm Number of attempts: 1 Airway Equipment and Method: Stylet and Oral airway Placement Confirmation: ETT inserted through vocal cords under direct vision, positive ETCO2 and breath sounds checked- equal and bilateral Secured at: 23 cm Tube secured with: Tape Dental Injury: Teeth and Oropharynx as per pre-operative assessment

## 2024-04-24 NOTE — Op Note (Signed)
 PATIENT NAME: Mark Elliott   MEDICAL RECORD NO.:   [id number]   DATE OF BIRTH:November 07, 1935   DATE OF PROCEDURE: 04/24/2024                               OPERATIVE REPORT   PREOPERATIVE DIAGNOSES: 1.  Right-sided lumbar radiculopathy 2.  L4-5 spinal stenosis, including a right-sided L4-5 disc herniation  POSTOPERATIVE DIAGNOSES: 1.  Right-sided lumbar radiculopathy 2.  L4-5 spinal stenosis, including a right-sided L4-5 disc herniation  PROCEDURE:  L4-5 laminectomy with right-sided partial facetectomy and foraminotomy, with removal of right-sided L4-5 disc herniation  SURGEON:  Oneil Priestly, MD.  ASSISTANTBETHA Ileana Clara, PA-C.  ANESTHESIA:  General endotracheal anesthesia.  COMPLICATIONS:  None.  DISPOSITION:  Stable.  ESTIMATED BLOOD LOSS:  Minimal.  INDICATIONS FOR SURGERY:  Briefly, Mark Elliott is a very pleasant 88- year-old male, who did present to me with pain in his right leg. The patient's MRI did reveal spinal stenosis at L4-5, in addition to a right sided L4-5 disc herniation.  We did proceed with appropriate conservative treatment, but the patient did continue to have ongoing pain, which he did feel was limiting his function substantially.  Given the patient's ongoing pain and dysfunction, we did discuss proceeding with the procedure reflected above.  The patient was fully aware of the risks and limitations of surgery and did wish to proceed.  OPERATIVE DETAILS:  On 04/24/2024, the patient was brought to surgery and general endotracheal anesthesia was administered.  The patient was placed prone on a well-padded flat Jackson bed with a Wilson frame. Antibiotics were given and the back was prepped and draped in the usual sterile fashion.  A time-out procedure was performed.  I then made a midline incision overlying the L4-5 intervertebral space.  The fascia was incised to the right of the midline.  The right sided paraspinal musculature was  bluntly retracted laterally and held retracted with a self-retaining retractor. After confirming the appropriate operative level, I did proceed with a partial facetectomy on the right sides.  Of note, there was very substantial and very significant overgrowth of the facet joint, and there was also very substantial hypertrophy of the ligamentum flavum.  This was causing severe spinal stenosis.  The lateral recess and neuroforaminal stenosis was decompressed using Kerrison punches and a high-speed bur. I then gently medially retracted the traversing right L5 nerve, and evaluated the disc space.  There was a protrusion identified.  An annulotomy was performed, and this material was removed from the lateral recess.  There were noted to be both acute and chronic disc fragments.  They were removed entirely.  This did entirely decompress the right lateral recess.  At this point, it was clear that the traversing right L5 nerve, the lateral recess, and the foramen at L4-5 on the right was thoroughly decompressed. All bleeding was then adequately controlled.  At this point, 40 mg of Depo-Medrol  was introduced about the epidural space.  Prior to this, the wound was copiously irrigated with a total of approximately 2 L of normal saline. Gelfoam was placed over the laminectomy site.  I was very pleased with the decompression.  There was no extravasation of cerebrospinal fluid noted throughout the entire surgery.  At this point, the wound was closed in layers using #1 Vicryl, followed by 2-0 Vicryl, followed by 4- 0 Monocryl.  Benzoin and Steri-Strips were applied, followed by a sterile dressing.  All instrument counts were correct at the termination of the procedure.  Of note, Ileana Clara, PA-C, was my assistant throughout surgery, and did aid in retraction, suctioning, and closure from start to finish.   Oneil Priestly, MD

## 2024-04-24 NOTE — Transfer of Care (Signed)
 Immediate Anesthesia Transfer of Care Note  Patient: Mark Elliott  Procedure(s) Performed: Lumbar four - lumbar five decomprssion  Patient Location: PACU  Anesthesia Type:General  Level of Consciousness: awake, alert , and oriented  Airway & Oxygen Therapy: Patient Spontanous Breathing and Patient connected to face mask oxygen  Post-op Assessment: Report given to RN and Post -op Vital signs reviewed and stable  Post vital signs: Reviewed and stable  Last Vitals:  Vitals Value Taken Time  BP 145/56 04/24/24 11:35  Temp    Pulse 62 04/24/24 11:39  Resp 20 04/24/24 11:39  SpO2 99 % 04/24/24 11:39  Vitals shown include unfiled device data.  Last Pain:  Vitals:   04/24/24 0719  TempSrc:   PainSc: 8       Patients Stated Pain Goal: 3 (04/24/24 0719)  Complications: No notable events documented.

## 2024-04-24 NOTE — H&P (Signed)
 PREOPERATIVE H&P  Chief Complaint: Right leg pain  HPI: Mark Elliott is a 88 y.o. male who presents with ongoing pain in the right leg  MRI reveals stenosis and a disc herniation at L4/5   Patient has failed multiple forms of conservative care and continues to have pain (see office notes for additional details regarding the patient's full course of treatment)  Past Medical History:  Diagnosis Date   Cancer [facility])    Prostate   Coronary artery disease    Diabetes mellitus without complication (HCC)    Dyspnea    with exterion   Hyperlipidemia    Hypertension    Past Surgical History:  Procedure Laterality Date   ANTERIOR FUSION CERVICAL SPINE     CORONARY ANGIOGRAPHY N/A 09/30/2020   Procedure: CORONARY ANGIOGRAPHY;  Surgeon: Swaziland, Peter M, MD;  Location: [facility] INVASIVE CV LAB;  Service: Cardiovascular;  Laterality: N/A;   CORONARY ARTERY BYPASS GRAFT N/A 10/12/2020   Procedure: CORONARY ARTERY BYPASS GRAFTING (CABG) TIMES FOUR USING LEFT INTERNAL MAMMARY ARTERY AND RIGHT GREATER SAPHENOUS VEIN HARVESTED ENDOSCOPICALLY;  Surgeon: German Bartlett PEDLAR, MD;  Location[facility] OR;  Service: Open Heart Surgery;  Laterality: N/A;   CORONARY PRESSURE/FFR STUDY N/A 09/30/2020   Procedure: INTRAVASCULAR PRESSURE WIRE/FFR STUDY;  Surgeon: Swaziland, Peter M, MD;  Location: [facility] INVASIVE CV LAB;  Service: Cardiovascular;  Laterality: N/A;   HERNIA REPAIR     LEFT HEART CATH AND CORONARY ANGIOGRAPHY N/A 11/15/2016   Procedure: Left Heart Cath and Coronary Angiography;  Surgeon: Ladona Heinz, MD;  Location: [facility] INVASIVE CV LAB;  Service: Cardiovascular;  Laterality: N/A;   LEFT HEART CATH AND CORONARY ANGIOGRAPHY N/A 08/18/2020   Procedure: LEFT HEART CATH AND CORONARY ANGIOGRAPHY;  Surgeon: Ladona Heinz, MD;  [facility] INVASIVE CV LAB;  Service: Cardiovascular;  Laterality: N/A;   LUMBAR LAMINECTOMY/DECOMPRESSION MICRODISCECTOMY N/A 10/26/2022   Procedure: LUMBAR 4- LUMBAR 5, LUMBAR 5- SACRUM 1  DECOMPRESSION;  Surgeon: Beuford Anes, MD;  Location[facility] OR;  Service: Orthopedics;  Laterality: N/A;   PROSTATE ABLATION     ROBOT ASSISTED LAPAROSCOPIC RADICAL PROSTATECTOMY  06/13/2007   TEE WITHOUT CARDIOVERSION N/A 10/12/2020   Procedure: TRANSESOPHAGEAL ECHOCARDIOGRAM (TEE);  Surgeon: German Bartlett PEDLAR, MD;  Location: [facility] OR;  Service: Open Heart Surgery;  Laterality: N/A;   TONSILLECTOMY     removed when pt was in high school   Social History   Socioeconomic History   Marital status: Married    Spouse name: Not on file   Number of children: 2   Years of education: Not on file   Highest education level: Not on file  Occupational History   Not on file  Tobacco Use   Smoking status: Former    Current packs/day: 0.00    Types: Cigarettes    Start date: 02/13/1969    Quit date: 02/13/1974    Years since quitting: 50.2   Smokeless tobacco: Never  Vaping Use   Vaping status: Never Used  Substance and Sexual Activity   Alcohol use: Yes    Comment: occasional   Drug use: No   Sexual activity: Not Currently  Other Topics Concern   Not on file  Social History Narrative   Not on file   Social Drivers of Health   Financial Resource Strain: Not on file  Food Insecurity: Patient Declined (10/26/2022)   Hunger Vital Sign    Worried About Running Out of Food in the Last Year: Patient declined  Ran Out of Food in the Last Year: Patient declined  Transportation Needs: No Transportation Needs (10/26/2022)   PRAPARE - Administrator, Civil Service (Medical): No    Lack of Transportation (Non-Medical): No  Physical Activity: Not on file  Stress: Not on file  Social Connections: Not on file   Family History  Problem Relation Age of Onset   Cancer Father        liver   Cancer Brother        pancreatic   No Known Allergies Prior to Admission medications   Medication Sig Start Date End Date Taking? Authorizing Provider  ACCU-CHEK AVIVA PLUS test strip Inject 1  application as directed 2 (two) times daily. 12/01/15  Yes [provider]  acetaminophen  (TYLENOL ) 650 MG CR tablet Take 650 mg by mouth every 8 (eight) hours as needed for pain.   Yes [provider]  aspirin  81 MG chewable tablet Chew 81 mg by mouth daily.   Yes [provider]  Dulaglutide 3 MG/0.5ML SOAJ Inject 3 mg into the skin every 7 (seven) days. 03/15/22  Yes [provider]  ezetimibe  (ZETIA ) 10 MG tablet Take 1 tablet (10 mg total) by mouth daily. 09/11/23  Yes Ladona Heinz, MD  LANTUS  SOLOSTAR 100 UNIT/ML Solostar Pen Inject 34 Units into the skin at bedtime. 01/11/16  Yes [provider]  metFORMIN  (GLUCOPHAGE -XR) 750 MG 24 hr tablet Take 750 mg by mouth daily with supper. 03/21/24  Yes [provider]  metoprolol  tartrate (LOPRESSOR ) 25 MG tablet Take 1 tablet (25 mg total) by mouth 2 (two) times daily. Patient taking differently: Take 12.5 mg by mouth 2 (two) times daily. 12/29/20  Yes Ladona Heinz, MD  olmesartan  (BENICAR ) 20 MG tablet Take 0.5 tablets (10 mg total) by mouth daily. 09/13/23  Yes Ladona Heinz, MD  rosuvastatin  (CRESTOR ) 20 MG tablet Take 1 tablet (20 mg total) by mouth daily. 10/05/23  Yes Ladona Heinz, MD  metFORMIN  (GLUCOPHAGE -XR) 500 MG 24 hr tablet Take 2 tablets (1,000 mg total) by mouth 2 (two) times daily. Patient not taking: Reported on 04/17/2024 10/02/20   Sw[place]il[place], Peter M, MD  nitroGLYCERIN  (NITROSTAT ) 0.4 MG SL tablet Place 1 tablet (0.4 mg total) under the tongue every 5 (five) minutes as needed for chest pain. Patient not taking: Reported on 04/17/2024 05/03/21 09/11/23  Thomasena Sherran BROCKS, PA-C     All other systems have been reviewed and were otherwise negative with the exception of those mentioned in the HPI and as above.  Physical Exam: Vitals:   04/24/24 0658  BP: (!) 147/71  Pulse: 61  Resp: 17  Temp: 98 F (36.7 C)  SpO2: 97%    Body mass index is 32.12 kg/m.  General: Alert, no acute  distress Cardiovascular: No pedal edema Respiratory: No cyanosis, no use of accessory musculature Skin: No lesions in the area of chief complaint Neurologic: Sensation intact distally Psychiatric: Patient is competent for consent with normal mood and affect Lymphatic: No axillary or cervical lymphadenopathy   Assessment/Plan: DISC HERNIATION AND STENOSIS, L4/5 Plan for Procedure(s): DECOMPRESSIVE LUMBAR LAMINECTOMY L4/5   Mark LITTIE Priestly, MD 04/24/2024 8:24 AM

## 2024-04-25 ENCOUNTER — Encounter (HOSPITAL_COMMUNITY): Payer: Self-pay | Admitting: Orthopedic Surgery

## 2024-04-25 MED FILL — Thrombin For Soln 5000 Unit: CUTANEOUS | Qty: 2 | Status: AC

## 2024-04-25 NOTE — Anesthesia Postprocedure Evaluation (Signed)
 Anesthesia Post Note  Patient: LENVIL SWAIM  Procedure(s) Performed: Lumbar four - lumbar five decomprssion     Patient location during evaluation: PACU Anesthesia Type: General Level of consciousness: awake and alert Pain management: pain level controlled Vital Signs Assessment: post-procedure vital signs reviewed and stable Respiratory status: spontaneous breathing, nonlabored ventilation and respiratory function stable Cardiovascular status: blood pressure returned to baseline and stable Postop Assessment: no apparent nausea or vomiting Anesthetic complications: no   No notable events documented.              Siennah Barrasso
